# Patient Record
Sex: Male | Born: 1970
Health system: Southern US, Community
[De-identification: ages and names within clinical notes are randomized; demographics above are authoritative.]

## PROBLEM LIST (undated history)

## (undated) DIAGNOSIS — R002 Palpitations: Secondary | ICD-10-CM

## (undated) DIAGNOSIS — K219 Gastro-esophageal reflux disease without esophagitis: Secondary | ICD-10-CM

## (undated) DIAGNOSIS — S42009A Fracture of unspecified part of unspecified clavicle, initial encounter for closed fracture: Secondary | ICD-10-CM

## (undated) DIAGNOSIS — I5020 Unspecified systolic (congestive) heart failure: Secondary | ICD-10-CM

## (undated) DIAGNOSIS — E669 Obesity, unspecified: Secondary | ICD-10-CM

## (undated) DIAGNOSIS — F419 Anxiety disorder, unspecified: Secondary | ICD-10-CM

## (undated) DIAGNOSIS — I2699 Other pulmonary embolism without acute cor pulmonale: Secondary | ICD-10-CM

## (undated) DIAGNOSIS — Z8669 Personal history of other diseases of the nervous system and sense organs: Secondary | ICD-10-CM

## (undated) DIAGNOSIS — I428 Other cardiomyopathies: Secondary | ICD-10-CM

## (undated) DIAGNOSIS — S4290XA Fracture of unspecified shoulder girdle, part unspecified, initial encounter for closed fracture: Secondary | ICD-10-CM

## (undated) DIAGNOSIS — N529 Male erectile dysfunction, unspecified: Secondary | ICD-10-CM

## (undated) DIAGNOSIS — I493 Ventricular premature depolarization: Secondary | ICD-10-CM

## (undated) DIAGNOSIS — K922 Gastrointestinal hemorrhage, unspecified: Secondary | ICD-10-CM

## (undated) DIAGNOSIS — I10 Essential (primary) hypertension: Secondary | ICD-10-CM

## (undated) DIAGNOSIS — M109 Gout, unspecified: Secondary | ICD-10-CM

## (undated) DIAGNOSIS — E785 Hyperlipidemia, unspecified: Secondary | ICD-10-CM

## (undated) DIAGNOSIS — I82401 Acute embolism and thrombosis of unspecified deep veins of right lower extremity: Secondary | ICD-10-CM

## (undated) HISTORY — PX: HIP SURGERY: SHX245

## (undated) HISTORY — DX: Gastro-esophageal reflux disease without esophagitis: K21.9

## (undated) HISTORY — DX: Gout, unspecified: M10.9

## (undated) HISTORY — PX: CARPAL TUNNEL RELEASE: SHX101

## (undated) HISTORY — DX: Male erectile dysfunction, unspecified: N52.9

## (undated) HISTORY — DX: Essential (primary) hypertension: I10

## (undated) HISTORY — DX: Fracture of unspecified shoulder girdle, part unspecified, initial encounter for closed fracture: S42.90XA

## (undated) HISTORY — DX: Other pulmonary embolism without acute cor pulmonale: I26.99

## (undated) HISTORY — DX: Fracture of unspecified part of unspecified clavicle, initial encounter for closed fracture: S42.009A

## (undated) HISTORY — DX: Palpitations: R00.2

## (undated) HISTORY — DX: Anxiety disorder, unspecified: F41.9

## (undated) HISTORY — DX: Other cardiomyopathies: I42.8

## (undated) HISTORY — DX: Acute embolism and thrombosis of unspecified deep veins of right lower extremity: I82.401

## (undated) HISTORY — PX: CARDIAC CATHETERIZATION: SHX172

## (undated) HISTORY — DX: Obesity, unspecified: E66.9

## (undated) HISTORY — DX: Hyperlipidemia, unspecified: E78.5

## (undated) HISTORY — DX: Unspecified systolic (congestive) heart failure: I50.20

## (undated) HISTORY — DX: Ventricular premature depolarization: I49.3

## (undated) HISTORY — DX: Gastrointestinal hemorrhage, unspecified: K92.2

## (undated) HISTORY — DX: Personal history of other diseases of the nervous system and sense organs: Z86.69

---

## 1999-08-30 ENCOUNTER — Emergency Department (HOSPITAL_COMMUNITY): Admission: EM | Admit: 1999-08-30 | Discharge: 1999-08-30 | Payer: Self-pay | Admitting: Emergency Medicine

## 2013-01-04 ENCOUNTER — Emergency Department: Payer: Self-pay | Admitting: Emergency Medicine

## 2013-01-04 LAB — COMPREHENSIVE METABOLIC PANEL
Albumin: 3.8 g/dL (ref 3.4–5.0)
Anion Gap: 6 — ABNORMAL LOW (ref 7–16)
Bilirubin,Total: 0.5 mg/dL (ref 0.2–1.0)
Calcium, Total: 9.2 mg/dL (ref 8.5–10.1)
Chloride: 107 mmol/L (ref 98–107)
Co2: 25 mmol/L (ref 21–32)
EGFR (African American): >60
Potassium: 3.8 mmol/L (ref 3.5–5.1)
SGOT(AST): 57 U/L — ABNORMAL HIGH (ref 15–37)
SGPT (ALT): 76 U/L (ref 12–78)
Total Protein: 8.2 g/dL (ref 6.4–8.2)

## 2013-01-04 LAB — URINALYSIS, COMPLETE
Blood: NEGATIVE
Ketone: NEGATIVE
Leukocyte Esterase: NEGATIVE
Nitrite: NEGATIVE
Ph: 5 (ref 4.5–8.0)
Protein: 100
RBC,UR: 10 /HPF (ref 0–5)

## 2013-01-04 LAB — CBC
HGB: 14.8 g/dL (ref 13.0–18.0)
MCHC: 34.2 g/dL (ref 32.0–36.0)
MCV: 91 fL (ref 80–100)
RBC: 4.77 10*6/uL (ref 4.40–5.90)
WBC: 8 10*3/uL (ref 3.8–10.6)

## 2013-01-04 LAB — LIPASE, BLOOD: Lipase: 300 U/L (ref 73–393)

## 2013-01-06 LAB — STOOL CULTURE

## 2013-09-13 HISTORY — PX: HERNIA REPAIR: SHX51

## 2014-05-27 ENCOUNTER — Emergency Department: Payer: Self-pay | Admitting: Emergency Medicine

## 2014-05-27 LAB — URIC ACID: Uric Acid: 5.4 mg/dL (ref 3.5–7.2)

## 2014-05-27 LAB — CBC WITH DIFFERENTIAL/PLATELET
BASOS ABS: 0.1 10*3/uL (ref 0.0–0.1)
Basophil %: 1.4 %
EOS PCT: 1.3 %
Eosinophil #: 0.1 10*3/uL (ref 0.0–0.7)
HCT: 40.9 % (ref 40.0–52.0)
HGB: 13.7 g/dL (ref 13.0–18.0)
Lymphocyte #: 2.1 10*3/uL (ref 1.0–3.6)
Lymphocyte %: 25 %
MCH: 30.4 pg (ref 26.0–34.0)
MCHC: 33.6 g/dL (ref 32.0–36.0)
MCV: 91 fL (ref 80–100)
MONO ABS: 0.5 x10 3/mm (ref 0.2–1.0)
MONOS PCT: 6.2 %
NEUTROS PCT: 66.1 %
Neutrophil #: 5.5 10*3/uL (ref 1.4–6.5)
Platelet: 237 10*3/uL (ref 150–440)
RBC: 4.51 10*6/uL (ref 4.40–5.90)
RDW: 13.3 % (ref 11.5–14.5)
WBC: 8.3 10*3/uL (ref 3.8–10.6)

## 2014-05-27 LAB — BASIC METABOLIC PANEL
ANION GAP: 4 — AB (ref 7–16)
BUN: 13 mg/dL (ref 7–18)
CALCIUM: 9.1 mg/dL (ref 8.5–10.1)
Chloride: 106 mmol/L (ref 98–107)
Co2: 28 mmol/L (ref 21–32)
Creatinine: 0.92 mg/dL (ref 0.60–1.30)
EGFR (African American): >60
Glucose: 96 mg/dL (ref 65–99)
OSMOLALITY: 276 (ref 275–301)
POTASSIUM: 3.9 mmol/L (ref 3.5–5.1)
SODIUM: 138 mmol/L (ref 136–145)

## 2014-05-27 LAB — SEDIMENTATION RATE: ERYTHROCYTE SED RATE: 8 mm/h (ref 0–15)

## 2014-06-06 ENCOUNTER — Ambulatory Visit: Payer: Self-pay | Admitting: Family Medicine

## 2014-07-31 ENCOUNTER — Ambulatory Visit: Payer: Self-pay | Admitting: Surgery

## 2014-08-07 ENCOUNTER — Ambulatory Visit: Payer: Self-pay | Admitting: Surgery

## 2015-01-04 NOTE — Op Note (Signed)
PATIENT NAME:  David Carlson, David Carlson MR#:  [id number] DATE OF BIRTH:  04/25/1971  DATE OF PROCEDURE:  08/07/2014  PREOPERATIVE DIAGNOSIS: Incarcerated left inguinal hernia, possible bilateral hernias.   POSTOPERATIVE DIAGNOSIS: Incarcerated left indirect inguinal hernia. No right-sided hernia.   PROCEDURE PERFORMED:  Robotic-assisted laparoscopic repair of left inguinal hernia.   ANESTHESIA: General.   ESTIMATED BLOOD LOSS: 25 mL.   COMPLICATIONS: None.   SPECIMENS: None.   INDICATION FOR SURGERY: David Carlson is a pleasant 44 year old who has a symptomatic left groin bulge and CT scan concerning for bilateral hernias. He was thus brought to the operating room suite for a possible repair of bilateral inguinal hernias laparoscopically using  da Vinci robot system.   DETAILS OF PROCEDURE AS FOLLOWS:  Informed consent was obtained.  David Carlson was brought to the operating room suite.  He was induced.  Endotracheal tube was placed. General anesthesia was administered. His abdomen was prepped and draped in standard surgical fashion. A timeout was then performed to correctly identify patient name, operative site, and procedure to be performed. The hernia initially was attempted to be reduced and it was unable to be reduced. I, therefore, decided to proceed with surgery.   A supraumbilical incision was made. It was deepened down to the fascia. The fascia was incised. Peritoneum was entered. Two stay sutures were placed in the fasciotomy. Hasson trocar was placed in the abdomen. Under direct visualization two 8 mm robotic trocar ports were placed 5 mm lateral, approximately 8 cm lateral to the Hasson trocar. Under direct visualization they were inserted to the depth of the sweet spot. A 10 mm trocar was also placed in the left upper quadrant, just slightly cephalad and in between the left lateral and periumbilical trocars. The robot was then docked. The hernia was examined. Both sides were examined. There  did not appear to be a right inguinal hernia.  The left side did have incarcerated omentum which was carefully reduced. After this, a peritoneal flap was placed that was made superiorly to the inguinal hernia. The peritoneal flap was made. The hernia was reduced. All 3 defects were visualized. There was a large indirect hernia, but no direct hernia. A large 3DMax Bard mesh was then placed.  It was tacked to the Cooper's ligament and superiorly to the abdominal wall. Next, the peritoneal flap was placed over the mesh and sutured with a Bard barbed Prolene mesh. The peritoneum was closed with visible mesh. I then made a small flap over the right side of the direct space to ensure that there was no direct hernia. This was closed using a Bard suture as well.   Following this, the abdomen was desufflated and all trocars were removed. The supraumbilical fascia was closed with a figure-of-eight 0 Vicryl. The skin was then closed at all trocar sites with interrupted 4-0 Monocryl deep dermal sutures. Steri-Strips, Telfa gauze, and Tegaderm were then placed over the wounds. The patient was then awoken, extubated, and brought to the postanesthesia care unit. There were no immediate complications. Needle, sponge, and instrument counts were correct at the end of the procedure.    ____________________________ Glena Norfolk. Emmalin Jaquess, MD cal:LT D: 08/07/2014 13:41:00 ET T: 08/07/2014 16:02:47 ET JOB#: [id number]  cc: Harrell Gave A. Icesis Renn, MD, <Dictator> Floyde Parkins MD ELECTRONICALLY SIGNED 08/19/2014 20:07

## 2015-02-14 ENCOUNTER — Telehealth: Payer: Self-pay | Admitting: Family Medicine

## 2015-02-14 DIAGNOSIS — I10 Essential (primary) hypertension: Secondary | ICD-10-CM

## 2015-02-14 MED ORDER — LOSARTAN POTASSIUM 100 MG PO TABS: 50.0000 mg | ORAL_TABLET | Freq: Every day | ORAL | Status: DC

## 2015-02-14 NOTE — Telephone Encounter (Signed)
Refilled medication for 1 month.  appt on 03/14/15

## 2015-03-14 ENCOUNTER — Encounter: Payer: Self-pay | Admitting: Family Medicine

## 2015-03-14 ENCOUNTER — Ambulatory Visit (INDEPENDENT_AMBULATORY_CARE_PROVIDER_SITE_OTHER): Payer: Managed Care, Other (non HMO) | Admitting: Family Medicine

## 2015-03-14 VITALS — BP 120/80 | HR 94 | Temp 98.6°F | Ht 71.0 in | Wt 204.8 lb

## 2015-03-14 DIAGNOSIS — E781 Pure hyperglyceridemia: Secondary | ICD-10-CM | POA: Diagnosis not present

## 2015-03-14 DIAGNOSIS — K219 Gastro-esophageal reflux disease without esophagitis: Secondary | ICD-10-CM | POA: Insufficient documentation

## 2015-03-14 DIAGNOSIS — I10 Essential (primary) hypertension: Secondary | ICD-10-CM | POA: Diagnosis not present

## 2015-03-14 DIAGNOSIS — A09 Infectious gastroenteritis and colitis, unspecified: Secondary | ICD-10-CM | POA: Insufficient documentation

## 2015-03-14 DIAGNOSIS — M542 Cervicalgia: Secondary | ICD-10-CM | POA: Insufficient documentation

## 2015-03-14 DIAGNOSIS — F411 Generalized anxiety disorder: Secondary | ICD-10-CM | POA: Diagnosis not present

## 2015-03-14 DIAGNOSIS — G44209 Tension-type headache, unspecified, not intractable: Secondary | ICD-10-CM | POA: Insufficient documentation

## 2015-03-14 DIAGNOSIS — L309 Dermatitis, unspecified: Secondary | ICD-10-CM | POA: Insufficient documentation

## 2015-03-14 DIAGNOSIS — E782 Mixed hyperlipidemia: Secondary | ICD-10-CM | POA: Insufficient documentation

## 2015-03-14 DIAGNOSIS — F419 Anxiety disorder, unspecified: Secondary | ICD-10-CM | POA: Insufficient documentation

## 2015-03-14 MED ORDER — GEMFIBROZIL 600 MG PO TABS: 600.0000 mg | ORAL_TABLET | Freq: Two times a day (BID) | ORAL | Status: DC

## 2015-03-14 MED ORDER — LOSARTAN POTASSIUM 50 MG PO TABS: 50.0000 mg | ORAL_TABLET | Freq: Every day | ORAL | Status: DC

## 2015-03-14 MED ORDER — CLONAZEPAM 0.5 MG PO TABS: 0.5000 mg | ORAL_TABLET | Freq: Three times a day (TID) | ORAL | Status: DC | PRN

## 2015-03-14 NOTE — Progress Notes (Signed)
Name: David Carlson   MRN: [id number]    DOB: 04-06-1971   Date:03/14/2015       Progress Note  Subjective  Chief Complaint  Chief Complaint  Patient presents with  . Follow-up    trigs. and B/P    Hyperlipidemia The current episode started more than 1 month ago (first diagnosed in November 2015). The problem is uncontrolled. Recent lipid tests were reviewed and are high (TG level of [phone]). Pertinent negatives include no leg pain, myalgias or shortness of breath. Current antihyperlipidemic treatment includes fibric acid derivatives (Lopid 600mg  BID). The current treatment provides moderate improvement of lipids. There are no compliance problems.   Hypertension The problem is controlled. Associated symptoms include anxiety and headaches. Pertinent negatives include no palpitations, peripheral edema or shortness of breath. Past treatments include angiotensin blockers. The current treatment provides significant improvement. There is no history of angina, kidney disease, CAD/MI or CVA.  Anxiety Presents for follow-up visit. Symptoms include irritability and muscle tension (Specifically tension in his neck muscles). Patient reports no insomnia, palpitations, panic or shortness of breath. The symptoms are aggravated by work stress.   Past treatments include benzodiazephines. The treatment provided significant relief. Compliance with prior treatments has been good.      Past Medical History  Diagnosis Date  . Hypertension   . Hyperlipidemia     Past Surgical History  Procedure Laterality Date  . Hernia repair      Family History  Problem Relation Age of Onset  . Diabetes Mother   . Hypertension Father   . Heart disease Maternal Grandmother   . Diabetes Maternal Grandfather     History   Social History  . Marital Status: Single    Spouse Name: N/A  . Number of Children: N/A  . Years of Education: N/A   Occupational History  . Not on file.   Social History  Main Topics  . Smoking status: Current Every Day Smoker  . Smokeless tobacco: Never Used  . Alcohol Use: 0.0 oz/week    0 Standard drinks or equivalent per week  . Drug Use: No  . Sexual Activity: Not on file   Other Topics Concern  . Not on file   Social History Narrative  . No narrative on file     Current outpatient prescriptions:  .  clonazePAM (KLONOPIN) 0.5 MG tablet, Take by mouth., Disp: , Rfl:  .  gemfibrozil (LOPID) 600 MG tablet, Take by mouth., Disp: , Rfl:  .  losartan (COZAAR) 100 MG tablet, Take 0.5 tablets (50 mg total) by mouth daily., Disp: 30 tablet, Rfl: 0  Not on File   Review of Systems  Constitutional: Positive for irritability.  Respiratory: Negative for shortness of breath.   Cardiovascular: Negative for palpitations.  Musculoskeletal: Negative for myalgias.  Neurological: Positive for headaches.  Psychiatric/Behavioral: The patient does not have insomnia.       Objective  Filed Vitals:   03/14/15 0904  BP: 120/80  Pulse: 94  Temp: 98.6 F (37 C)  TempSrc: Oral  Height: 5\' 11"  (1.803 m)  Weight: 204 lb 12.8 oz (92.897 kg)  SpO2: 97%    Physical Exam  Constitutional: He is oriented to person, place, and time and well-developed, well-nourished, and in no distress.  HENT:  Head: Normocephalic and atraumatic.  Cardiovascular: Normal rate and regular rhythm.   Pulmonary/Chest: Effort normal and breath sounds normal.  Musculoskeletal: He exhibits no edema.  Neurological: He is alert and oriented to person,  place, and time.  Psychiatric: Affect and judgment normal.  Nursing note and vitals reviewed.      No results found for this or any previous visit (from the past 2160 hour(s)).   Assessment & Plan  1. Essential hypertension Blood pressure stable on present therapy. - losartan (COZAAR) 50 MG tablet; Take 1 tablet (50 mg total) by mouth daily.  Dispense: 90 tablet; Refill: 0  2. Generalized anxiety disorder Symptoms of  anxiety and muscle tension are responsive to present benzodiazepine therapy. Patient is taking the medication as needed and as directed. - clonazePAM (KLONOPIN) 0.5 MG tablet; Take 1 tablet (0.5 mg total) by mouth 3 (three) times daily as needed for anxiety.  Dispense: 90 tablet; Refill: 0  3. Hypertriglyceridemia Repeat lipid panel for follow-up of triglyceride levels today - gemfibrozil (LOPID) 600 MG tablet; Take 1 tablet (600 mg total) by mouth 2 (two) times daily before a meal.  Dispense: 180 tablet; Refill: 0 - Lipid panel - Comprehensive metabolic panel   Chrisanne Loose [facility] 03/14/2015 9:24 AM

## 2015-03-15 LAB — COMPREHENSIVE METABOLIC PANEL
ALT: 26 IU/L (ref 0–44)
AST: 23 IU/L (ref 0–40)
Albumin/Globulin Ratio: 1.9 (ref 1.1–2.5)
Albumin: 4.4 g/dL (ref 3.5–5.5)
Alkaline Phosphatase: 59 IU/L (ref 39–117)
BUN / CREAT RATIO: 16 (ref 9–20)
BUN: 15 mg/dL (ref 6–24)
Bilirubin Total: 0.4 mg/dL (ref 0.0–1.2)
CALCIUM: 9.4 mg/dL (ref 8.7–10.2)
CHLORIDE: 103 mmol/L (ref 97–108)
CO2: 22 mmol/L (ref 18–29)
Creatinine, Ser: 0.94 mg/dL (ref 0.76–1.27)
GFR calc Af Amer: 114 mL/min/{1.73_m2} (ref >59)
GFR, EST NON AFRICAN AMERICAN: 99 mL/min/{1.73_m2} (ref >59)
GLUCOSE: 96 mg/dL (ref 65–99)
Globulin, Total: 2.3 g/dL (ref 1.5–4.5)
Potassium: 4.6 mmol/L (ref 3.5–5.2)
SODIUM: 144 mmol/L (ref 134–144)
Total Protein: 6.7 g/dL (ref 6.0–8.5)

## 2015-03-15 LAB — LIPID PANEL
CHOLESTEROL TOTAL: 178 mg/dL (ref 100–199)
Chol/HDL Ratio: 5.7 ratio units — ABNORMAL HIGH (ref 0.0–5.0)
HDL: 31 mg/dL — AB (ref >39)
LDL CALC: 113 mg/dL — AB (ref 0–99)
Triglycerides: 168 mg/dL — ABNORMAL HIGH (ref 0–149)
VLDL Cholesterol Cal: 34 mg/dL (ref 5–40)

## 2015-06-17 ENCOUNTER — Ambulatory Visit (INDEPENDENT_AMBULATORY_CARE_PROVIDER_SITE_OTHER): Payer: Managed Care, Other (non HMO) | Admitting: Family Medicine

## 2015-06-17 ENCOUNTER — Encounter: Payer: Self-pay | Admitting: Family Medicine

## 2015-06-17 VITALS — BP 118/77 | HR 82 | Temp 98.0°F | Resp 18 | Ht 71.0 in | Wt 196.5 lb

## 2015-06-17 DIAGNOSIS — F411 Generalized anxiety disorder: Secondary | ICD-10-CM | POA: Diagnosis not present

## 2015-06-17 DIAGNOSIS — E782 Mixed hyperlipidemia: Secondary | ICD-10-CM | POA: Diagnosis not present

## 2015-06-17 DIAGNOSIS — I10 Essential (primary) hypertension: Secondary | ICD-10-CM | POA: Diagnosis not present

## 2015-06-17 MED ORDER — GEMFIBROZIL 600 MG PO TABS: 600.0000 mg | ORAL_TABLET | Freq: Two times a day (BID) | ORAL | Status: DC

## 2015-06-17 MED ORDER — LOSARTAN POTASSIUM 50 MG PO TABS: 50.0000 mg | ORAL_TABLET | Freq: Every day | ORAL | Status: DC

## 2015-06-17 MED ORDER — CLONAZEPAM 0.5 MG PO TABS: 0.5000 mg | ORAL_TABLET | Freq: Three times a day (TID) | ORAL | Status: DC | PRN

## 2015-06-17 NOTE — Progress Notes (Signed)
Name: David Carlson   MRN: [id number]    DOB: 01-29-1971   Date:06/17/2015       Progress Note  Subjective  Chief Complaint  Chief Complaint  Patient presents with  . Follow-up    3 mo  . Anxiety  . Hypertension  . Medication Refill    losartan 50mg  / clnazepam 0.5mg     Anxiety Presents for follow-up visit. Symptoms include excessive worry, irritability and nervous/anxious behavior. Patient reports no chest pain, palpitations, panic or shortness of breath. The symptoms are aggravated by work stress.   Past treatments include benzodiazephines. The treatment provided significant relief. Compliance with prior treatments has been good.  Hypertension This is a chronic problem. The problem is controlled. Associated symptoms include anxiety. Pertinent negatives include no blurred vision, chest pain, headaches, palpitations or shortness of breath. Past treatments include angiotensin blockers. The current treatment provides significant improvement. There is no history of CAD/MI or CVA.  Hyperlipidemia This is a chronic problem. Recent lipid tests were reviewed and are high. Pertinent negatives include no chest pain, leg pain, myalgias or shortness of breath. Current antihyperlipidemic treatment includes fibric acid derivatives. The current treatment provides moderate improvement of lipids.   Past Medical History  Diagnosis Date  . Hypertension   . Hyperlipidemia     Past Surgical History  Procedure Laterality Date  . Hernia repair     Family History  Problem Relation Age of Onset  . Diabetes Mother   . Hypertension Father   . Heart disease Maternal Grandmother   . Diabetes Maternal Grandfather    Social History   Social History  . Marital Status: Single    Spouse Name: N/A  . Number of Children: N/A  . Years of Education: N/A   Occupational History  . Not on file.   Social History Main Topics  . Smoking status: Current Every Day Smoker  . Smokeless tobacco: Never  Used  . Alcohol Use: 0.0 oz/week    0 Standard drinks or equivalent per week  . Drug Use: No  . Sexual Activity: Not on file   Other Topics Concern  . Not on file   Social History Narrative    Current outpatient prescriptions:  .  clonazePAM (KLONOPIN) 0.5 MG tablet, Take 1 tablet (0.5 mg total) by mouth 3 (three) times daily as needed for anxiety., Disp: 90 tablet, Rfl: 0 .  gemfibrozil (LOPID) 600 MG tablet, Take 1 tablet (600 mg total) by mouth 2 (two) times daily before a meal., Disp: 180 tablet, Rfl: 0 .  losartan (COZAAR) 50 MG tablet, Take 1 tablet (50 mg total) by mouth daily., Disp: 90 tablet, Rfl: 0  No Known Allergies   Review of Systems  Constitutional: Positive for irritability.  Eyes: Negative for blurred vision.  Respiratory: Negative for shortness of breath.   Cardiovascular: Negative for chest pain and palpitations.  Musculoskeletal: Negative for myalgias.  Neurological: Negative for headaches.  Psychiatric/Behavioral: Negative for depression. The patient is nervous/anxious.    Objective  Filed Vitals:   06/17/15 0817  BP: 118/77  Pulse: 82  Temp: 98 F (36.7 C)  TempSrc: Oral  Resp: 18  Height: 5\' 11"  (1.803 m)  Weight: 196 lb 8 oz (89.132 kg)  SpO2: 97%   Physical Exam  Constitutional: He is oriented to person, place, and time and well-developed, well-nourished, and in no distress.  HENT:  Head: Normocephalic and atraumatic.  Cardiovascular: Normal rate and regular rhythm.   Pulmonary/Chest: Effort normal and  breath sounds normal.  Abdominal: Soft. Bowel sounds are normal.  Neurological: He is alert and oriented to person, place, and time.  Psychiatric: Memory, affect and judgment normal.  Nursing note and vitals reviewed.  Assessment & Plan  1. Essential hypertension Blood pressure at goal. Continue present therapy. - losartan (COZAAR) 50 MG tablet; Take 1 tablet (50 mg total) by mouth daily.  Dispense: 30 tablet; Refill: 2  2.  Generalized anxiety disorder  - clonazePAM (KLONOPIN) 0.5 MG tablet; Take 1 tablet (0.5 mg total) by mouth 3 (three) times daily as needed for anxiety.  Dispense: 90 tablet; Refill: 2  3. Hypercholesterolemia with hypertriglyceridemia LDL was elevated in July 2016. Recheck lipid panel today. Continue gemfibrozil for now. - gemfibrozil (LOPID) 600 MG tablet; Take 1 tablet (600 mg total) by mouth 2 (two) times daily before a meal.  Dispense: 60 tablet; Refill: 2 - Lipid Profile   Jance Siek [facility] 06/17/2015 8:29 AM

## 2015-06-18 LAB — LIPID PANEL
CHOLESTEROL TOTAL: 188 mg/dL (ref 100–199)
Chol/HDL Ratio: 6.1 ratio units — ABNORMAL HIGH (ref 0.0–5.0)
HDL: 31 mg/dL — ABNORMAL LOW (ref >39)
LDL CALC: 119 mg/dL — AB (ref 0–99)
TRIGLYCERIDES: 192 mg/dL — AB (ref 0–149)
VLDL Cholesterol Cal: 38 mg/dL (ref 5–40)

## 2015-07-10 ENCOUNTER — Ambulatory Visit (INDEPENDENT_AMBULATORY_CARE_PROVIDER_SITE_OTHER): Payer: Managed Care, Other (non HMO) | Admitting: Family Medicine

## 2015-07-10 ENCOUNTER — Encounter: Payer: Self-pay | Admitting: Family Medicine

## 2015-07-10 VITALS — BP 118/73 | HR 98 | Temp 97.9°F | Resp 19 | Ht 71.0 in | Wt 195.7 lb

## 2015-07-10 DIAGNOSIS — R1030 Lower abdominal pain, unspecified: Secondary | ICD-10-CM

## 2015-07-10 DIAGNOSIS — E782 Mixed hyperlipidemia: Secondary | ICD-10-CM

## 2015-07-10 DIAGNOSIS — R1032 Left lower quadrant pain: Secondary | ICD-10-CM

## 2015-07-10 MED ORDER — ATORVASTATIN CALCIUM 10 MG PO TABS: 10.0000 mg | ORAL_TABLET | Freq: Every day | ORAL | Status: DC

## 2015-07-10 NOTE — Progress Notes (Signed)
Name: David Carlson   MRN: [id number]    DOB: 1971-03-17   Date:07/10/2015       Progress Note  Subjective  Chief Complaint  Chief Complaint  Patient presents with  . Follow-up    Discuss statin therapy  . Hyperlipidemia    Hyperlipidemia This is a chronic problem. Recent lipid tests were reviewed and are high. Current antihyperlipidemic treatment includes fibric acid derivatives.   Persistently elevated triglycerides and LDL while HDL is below the normal range. 10 year risk of CVD is estimated at 8%.  David Carlson also complains of recurrent left groin pain. He has history of left inguinal hernia which was repaired some years ago. Now seeing any bulge at the site. No nausea, vomiting, or other concerning symptoms. Feels that sometimes the pain from the front radiates to his lower back  Past Medical History  Diagnosis Date  . Hypertension   . Hyperlipidemia     Past Surgical History  Procedure Laterality Date  . Hernia repair      Family History  Problem Relation Age of Onset  . Diabetes Mother   . Hypertension Father   . Heart disease Maternal Grandmother   . Diabetes Maternal Grandfather     Social History   Social History  . Marital Status: Single    Spouse Name: N/A  . Number of Children: N/A  . Years of Education: N/A   Occupational History  . Not on file.   Social History Main Topics  . Smoking status: Current Every Day Smoker  . Smokeless tobacco: Never Used  . Alcohol Use: 0.0 oz/week    0 Standard drinks or equivalent per week  . Drug Use: No  . Sexual Activity: Not on file   Other Topics Concern  . Not on file   Social History Narrative     Current outpatient prescriptions:  .  clonazePAM (KLONOPIN) 0.5 MG tablet, Take 1 tablet (0.5 mg total) by mouth 3 (three) times daily as needed for anxiety., Disp: 90 tablet, Rfl: 2 .  gemfibrozil (LOPID) 600 MG tablet, Take 1 tablet (600 mg total) by mouth 2 (two) times daily before a meal., Disp: 60  tablet, Rfl: 2 .  losartan (COZAAR) 50 MG tablet, Take 1 tablet (50 mg total) by mouth daily., Disp: 30 tablet, Rfl: 2  No Known Allergies   Review of Systems  Constitutional: Negative for fever and chills.  Gastrointestinal: Positive for abdominal pain. Negative for nausea and vomiting.      Objective  Filed Vitals:   07/10/15 1616  BP: 118/73  Pulse: 98  Temp: 97.9 F (36.6 C)  TempSrc: Oral  Resp: 19  Height: 5\' 11"  (1.803 m)  Weight: 195 lb 11.2 oz (88.769 kg)  SpO2: 94%    Physical Exam  Constitutional: He is well-developed, well-nourished, and in no distress.  Cardiovascular: Normal rate, regular rhythm and normal heart sounds.   Pulmonary/Chest: Effort normal and breath sounds normal. He has no wheezes. He has no rales.  Abdominal: Soft. Bowel sounds are normal. There is tenderness in the left lower quadrant.    Mild tenderness to palpation over the left groin/inguinal area. Pt. Feels that pain travels from the front to his lower back. History of prior hernia repair.  Nursing note and vitals reviewed.   Recent Results (from the past 2160 hour(s))  Lipid Profile     Status: Abnormal   Collection Time: 06/17/15  9:18 AM  Result Value Ref Range   Cholesterol,  Total 188 100 - 199 mg/dL   Triglycerides 192 (H) 0 - 149 mg/dL   HDL 31 (L) >39 mg/dL    Comment: According to ATP-III Guidelines, HDL-C >59 mg/dL is considered a negative risk factor for CHD.    VLDL Cholesterol Cal 38 5 - 40 mg/dL   LDL Calculated 119 (H) 0 - 99 mg/dL   Chol/HDL Ratio 6.1 (H) 0.0 - 5.0 ratio units    Comment:                                   T. Chol/HDL Ratio                                             Men  Women                               1/2 Avg.Risk  3.4    3.3                                   Avg.Risk  5.0    4.4                                2X Avg.Risk  9.6    7.1                                3X Avg.Risk 23.4   11.0      Assessment & Plan  1.  Hypercholesterolemia with hypertriglyceridemia DC gemfibrozil and started patient on a atorvastatin 10 mg at bedtime. Recheck lipid panel in 3-4 months. - atorvastatin (LIPITOR) 10 MG tablet; Take 1 tablet (10 mg total) by mouth daily.  Dispense: 90 tablet; Refill: 0  2. Left inguinal pain Persistent discomfort at the site of left inguinal hernia repair. We'll obtain MRI of pelvis for evaluation. - MR Pelvis W Wo Contrast; Future   Zameer Borman [facility] 07/10/2015 4:36 PM

## 2015-07-31 ENCOUNTER — Telehealth: Payer: Self-pay | Admitting: Family Medicine

## 2015-07-31 NOTE — Telephone Encounter (Signed)
Patient is checking status on his MRI referral appointment. Please return call [phone]

## 2015-08-01 ENCOUNTER — Encounter: Payer: Self-pay | Admitting: *Deleted

## 2015-08-01 ENCOUNTER — Emergency Department
Admission: EM | Admit: 2015-08-01 | Discharge: 2015-08-01 | Disposition: A | Discharge status: Home / Self Care | Payer: Managed Care, Other (non HMO) | Attending: Emergency Medicine | Admitting: Emergency Medicine

## 2015-08-01 DIAGNOSIS — F17200 Nicotine dependence, unspecified, uncomplicated: Secondary | ICD-10-CM | POA: Insufficient documentation

## 2015-08-01 DIAGNOSIS — R1030 Lower abdominal pain, unspecified: Secondary | ICD-10-CM | POA: Diagnosis not present

## 2015-08-01 DIAGNOSIS — I10 Essential (primary) hypertension: Secondary | ICD-10-CM | POA: Insufficient documentation

## 2015-08-01 DIAGNOSIS — Z79899 Other long term (current) drug therapy: Secondary | ICD-10-CM | POA: Diagnosis not present

## 2015-08-01 DIAGNOSIS — R1032 Left lower quadrant pain: Secondary | ICD-10-CM

## 2015-08-01 MED ORDER — HYDROCODONE-ACETAMINOPHEN 5-325 MG PO TABS: 1.0000 | ORAL_TABLET | Freq: Four times a day (QID) | ORAL | Status: DC | PRN

## 2015-08-01 MED ORDER — HYDROCODONE-ACETAMINOPHEN 5-325 MG PO TABS
2.0000 | ORAL_TABLET | Freq: Once | ORAL | Status: AC
  Administered 2015-08-01: 2 via ORAL
  Filled 2015-08-01: qty 2

## 2015-08-01 NOTE — ED Notes (Signed)
Pt reports left sided groin pain for the last several months, pt reports an increase in pain, pt denies dysuria or any other symptoms

## 2015-08-01 NOTE — ED Provider Notes (Signed)
[facility] Emergency Department Provider Note REMINDER - THIS NOTE IS NOT A FINAL MEDICAL RECORD UNTIL IT IS SIGNED. UNTIL THEN, THE CONTENT BELOW MAY REFLECT INFORMATION FROM A DOCUMENTATION TEMPLATE, NOT THE ACTUAL PATIENT VISIT. ____________________________________________  Time seen: Approximately 2:12 PM  I have reviewed the triage vital signs and the nursing notes.   HISTORY  Chief Complaint Groin Pain    HPI David Carlson is a 44 y.o. male the previous history of hypertension, hyperlipidemia, and a left inguinal hernia repair in November last year.  Patient reports for about the last month he said increasing discomfort at the site of his old hernia repair, it comes and goes at times, and is never severe but has become aching and that nagging pain. He was supposed to have an MRI done, but evidently it cannot be done for another 2 weeks so he came to the ER for evaluation today. Denies nausea vomiting, denies pain in the abdomen. No fevers or chills. No pain in the scrotum or testicles.   He denies severe pain, and reports that his hernia was much much worse when he was incarcerated year ago.  No redness or swelling.   Past Medical History  Diagnosis Date  . Hypertension   . Hyperlipidemia     Patient Active Problem List   Diagnosis Date Noted  . Left inguinal pain 07/10/2015  . Acid reflux 03/14/2015  . Anxiety disorder 03/14/2015  . Hypercholesterolemia with hypertriglyceridemia 03/14/2015  . BP (high blood pressure) 03/14/2015  . Dermatitis, eczematoid 03/14/2015  . Headache, tension-type 03/14/2015  . Cervical pain 03/14/2015    Past Surgical History  Procedure Laterality Date  . Hernia repair      Current Outpatient Rx  Name  Route  Sig  Dispense  Refill  . atorvastatin (LIPITOR) 10 MG tablet   Oral   Take 1 tablet (10 mg total) by mouth daily.   90 tablet   0   . clonazePAM (KLONOPIN) 0.5 MG tablet   Oral   Take 1  tablet (0.5 mg total) by mouth 3 (three) times daily as needed for anxiety.   90 tablet   2   . HYDROcodone-acetaminophen (NORCO/VICODIN) 5-325 MG tablet   Oral   Take 1 tablet by mouth every 6 (six) hours as needed for moderate pain.   20 tablet   0   . losartan (COZAAR) 50 MG tablet   Oral   Take 1 tablet (50 mg total) by mouth daily.   30 tablet   2     Allergies Review of patient's allergies indicates no known allergies.  Family History  Problem Relation Age of Onset  . Diabetes Mother   . Hypertension Father   . Heart disease Maternal Grandmother   . Diabetes Maternal Grandfather     Social History Social History  Substance Use Topics  . Smoking status: Current Every Day Smoker  . Smokeless tobacco: Never Used  . Alcohol Use: 0.0 oz/week    0 Standard drinks or equivalent per week    Review of Systems Constitutional: No fever/chills Eyes: No visual changes. ENT: No sore throat. Cardiovascular: Denies chest pain. Respiratory: Denies shortness of breath. Gastrointestinal: No abdominal pain.  No nausea, no vomiting.  No diarrhea.  No constipation. See history of present illness regarding left groin discomfort. Genitourinary: Negative for dysuria. Musculoskeletal: Negative for back pain. Skin: Negative for rash. Neurological: Negative for headaches, focal weakness or numbness.  10-point ROS otherwise negative.  ____________________________________________   PHYSICAL EXAM:  VITAL SIGNS: ED Triage Vitals  Enc Vitals Group     BP 08/01/15 1328 149/95 mmHg     Pulse Rate 08/01/15 1328 109     Resp 08/01/15 1328 20     Temp 08/01/15 1328 98 F (36.7 C)     Temp Source 08/01/15 1328 Oral     SpO2 08/01/15 1328 97 %     Weight 08/01/15 1328 194 lb (87.998 kg)     Height 08/01/15 1328 5\' 11"  (1.803 m)     Head Cir --      Peak Flow --      Pain Score 08/01/15 1329 6     Pain Loc --      Pain Edu? --      Excl. in [place]? --    Constitutional: Alert and  oriented. Well appearing and in no acute distress. Eyes: Conjunctivae are normal. PERRL. EOMI. Head: Atraumatic. Nose: No congestion/rhinnorhea. Mouth/Throat: Mucous membranes are moist.  Oropharynx non-erythematous. Neck: No stridor.   Cardiovascular: Normal rate, regular rhythm. Grossly normal heart sounds.  Good peripheral circulation. Respiratory: Normal respiratory effort.  No retractions. Lungs CTAB. Gastrointestinal: Soft and nontender. No distention. No abdominal bruits. No CVA tenderness. Patient has some very mild tenderness over the left groin without erythema, edema, or induration. On testicular exam and inguinal canal exam there is no evidence of hernia, the patient's left inguinal canal is nontender without mass palpated. Musculoskeletal: No lower extremity tenderness nor edema.  No joint effusions. Neurologic:  Normal speech and language. No gross focal neurologic deficits are appreciated. No gait instability. Skin:  Skin is warm, dry and intact. No rash noted. Psychiatric: Mood and affect are normal. Speech and behavior are normal.  ____________________________________________   LABS (all labs ordered are listed, but only abnormal results are displayed)  Labs Reviewed - No data to display ____________________________________________  EKG   ____________________________________________  RADIOLOGY   ____________________________________________   PROCEDURES  Procedure(s) performed: None  Critical Care performed: No  ____________________________________________   INITIAL IMPRESSION / ASSESSMENT AND PLAN / ED COURSE  Pertinent labs & imaging results that were available during my care of the patient were reviewed by me and considered in my medical decision making (see chart for details).  Patient presents with increasing pain at the same as previous groin repair. I believe he likely has some increasing pain at the site of his previous hernia repair, however he  has no evidence of acute incarceration. He does however have slowly worsening discomfort in the area, and his exam is quite reassuring with no abdominal pain and no evidence of incarcerated hernia. I discussed this case and care with Dr. Sherri Rad of [facility] surgical, who advises that no imaging is necessary at this time but the patient should follow up closely with them in the clinic this week. The patient is agreeable with this plan, and we discuss careful return precautions for signs and symptoms of an incarcerated hernia which the patient has had previously, but clearly does not demonstrate signs or symptoms of today. The patient is quite agreeable. He is agreeable not to drive while taking hydrocodone.   I will prescribe the patient a narcotic pain medicine due to their condition which I anticipate will cause at least moderate pain short term. I discussed with the patient safe use of narcotic pain medicines, and that they are not to drive, use within 8 hours of clonazepam, work in dangerous areas, or ever take  more than prescribed (no more than 1 pill every 6 hours). We discussed that this is the type of medication that "Alfonse Spruce" may have overdosed on and the risks of this type of medicine. Patient is very agreeable to only use as prescribed and to never use more than prescribed.  ____________________________________________   FINAL CLINICAL IMPRESSION(S) / ED DIAGNOSES  Final diagnoses:  Groin pain, left      Delman Kitten, MD 08/01/15 1421

## 2015-08-01 NOTE — ED Notes (Signed)
Pt verbalized understanding of discharge instructions. NAD at this time. 

## 2015-08-01 NOTE — Discharge Instructions (Signed)
You were seen in the emergency room for abdominal pain. It is important that you follow up closely with [facility] (Dr. Marina Gravel) in the next 4 to 5 days.  Please return to the emergency room right away if you are to develop a fever, severe nausea, your pain becomes severe or worsens, you are unable to keep food down, begin vomiting any dark or bloody fluid, you develop any dark or bloody stools, feel dehydrated, or other new concerns or symptoms arise.

## 2015-08-01 NOTE — ED Notes (Signed)
Pt states his PCP was going to schedule him for MRI and that it will be 2 or more weeks before it can be scheduled. Pt also states that he had a hernia a year ago on the same side. Pt states pain has increased and could not wait for MRI.

## 2015-08-01 NOTE — Telephone Encounter (Signed)
Pt notified of his MRI appt, date and time.

## 2015-08-04 ENCOUNTER — Ambulatory Visit (INDEPENDENT_AMBULATORY_CARE_PROVIDER_SITE_OTHER): Payer: Managed Care, Other (non HMO) | Admitting: Surgery

## 2015-08-04 ENCOUNTER — Telehealth: Payer: Self-pay

## 2015-08-04 ENCOUNTER — Other Ambulatory Visit: Payer: Self-pay | Admitting: *Deleted

## 2015-08-04 ENCOUNTER — Encounter: Payer: Self-pay | Admitting: Surgery

## 2015-08-04 VITALS — BP 148/92 | HR 83 | Temp 98.3°F | Ht 71.0 in | Wt 199.0 lb

## 2015-08-04 DIAGNOSIS — R1030 Lower abdominal pain, unspecified: Secondary | ICD-10-CM

## 2015-08-04 NOTE — Telephone Encounter (Signed)
Called patient to let him know that his appointment for his CT Scan will be on 08/12/2015 at 9:15 AM at the [place] location. I also told him that he should not drink or eat 4 hours prior to his appointment.  I also told him that his MRI was cancelled. Patient understood.  Patient was given a follow up appointment to come and see Dr. Burt Knack on 08/29/2015 at 1:45 PM at the [facility] office to go over his CT Scan results.

## 2015-08-04 NOTE — Patient Instructions (Signed)
Central scheduling will be contacting you with the date and time of your CT Scan. I will cancel your MRI.

## 2015-08-04 NOTE — Progress Notes (Signed)
Outpatient Surgical Follow Up  08/04/2015  David Carlson is an 44 y.o. male.   CC: Left Groin pain  HPI: This a patient status post robotic transabdominal repair of a left inguinal hernia by Dr. Rexene Edison. At the time no right inguinal hernia was identified. This was done in November 2015 1 year ago. Patient describes 6 months of left groin pain without bulge it is not affecting his ability to work. He has no nausea or vomiting and no fevers or chills.   Past Medical History  Diagnosis Date  . Hypertension   . Hyperlipidemia     Past Surgical History  Procedure Laterality Date  . Hernia repair      Family History  Problem Relation Age of Onset  . Diabetes Mother   . Hypertension Father   . Heart disease Maternal Grandmother   . Diabetes Maternal Grandfather     Social History:  reports that he has been smoking Cigarettes.  He has been smoking about 1.00 pack per day. He has never used smokeless tobacco. He reports that he drinks alcohol. He reports that he does not use illicit drugs.  Allergies: No Known Allergies  Medications reviewed.   Review of Systems:   Review of Systems  Constitutional: Negative.   HENT: Negative.   Eyes: Negative.   Respiratory: Negative.   Cardiovascular: Negative.   Gastrointestinal: Positive for abdominal pain. Negative for diarrhea and constipation.  Genitourinary: Negative.   Musculoskeletal: Negative.   Skin: Negative.   Neurological: Negative.   Endo/Heme/Allergies: Negative.   Psychiatric/Behavioral: Negative.      Physical Exam:  BP 148/92 mmHg  Pulse 83  Temp(Src) 98.3 F (36.8 C) (Oral)  Ht 5\' 11"  (1.803 m)  Wt 199 lb (90.266 kg)  BMI 27.77 kg/m2  Physical Exam  Constitutional: He is oriented to person, place, and time and well-developed, well-nourished, and in no distress. No distress.  HENT:  Head: Normocephalic and atraumatic.  Eyes: Right eye exhibits no discharge. Left eye exhibits no discharge. No  scleral icterus.  Abdominal: Soft. He exhibits no distension. There is no tenderness. There is no rebound and no guarding.  Genitourinary: Penis normal.  Patient examined standing and supine and with Valsalva. No right inguinal hernias noted. No recurrent left inguinal hernia is noted and he is nontender.  Musculoskeletal: Normal range of motion.  Lymphadenopathy:    He has no cervical adenopathy.  Neurological: He is alert and oriented to person, place, and time.  Skin: Skin is warm and dry.  Psychiatric: Mood and affect normal.      No results found for this or any previous visit (from the past 48 hour(s)). No results found.  Assessment/Plan:  Status post robotic left transabdominal inguinal hernia repair. He is 1 year out and is having 6 months of groin pain. We'll recommend obtaining a CT scan of follow-up in 2 weeks.  Florene Glen, MD, FACS

## 2015-08-11 ENCOUNTER — Telehealth (HOSPITAL_COMMUNITY): Payer: Self-pay | Admitting: Surgery

## 2015-08-12 ENCOUNTER — Ambulatory Visit: Admission: RE | Admit: 2015-08-12 | Payer: Managed Care, Other (non HMO) | Source: Ambulatory Visit

## 2015-08-19 ENCOUNTER — Ambulatory Visit
Admission: RE | Admit: 2015-08-19 | Discharge: 2015-08-19 | Disposition: A | Discharge status: Home / Self Care | Payer: Managed Care, Other (non HMO) | Source: Ambulatory Visit | Attending: Surgery | Admitting: Surgery

## 2015-08-19 DIAGNOSIS — K4090 Unilateral inguinal hernia, without obstruction or gangrene, not specified as recurrent: Secondary | ICD-10-CM | POA: Diagnosis not present

## 2015-08-19 DIAGNOSIS — R1030 Lower abdominal pain, unspecified: Secondary | ICD-10-CM | POA: Insufficient documentation

## 2015-08-19 MED ORDER — IOHEXOL 350 MG/ML SOLN
100.0000 mL | Freq: Once | INTRAVENOUS | Status: AC | PRN
  Administered 2015-08-19: 100 mL via INTRAVENOUS

## 2015-08-20 ENCOUNTER — Ambulatory Visit: Payer: Self-pay

## 2015-08-25 ENCOUNTER — Telehealth: Payer: Self-pay

## 2015-08-25 NOTE — Telephone Encounter (Signed)
Patient calls in with complaints of pain at this time and he is out of his pain medication from the ER. Explained that we cannot prescribe him pain medication without being seen by a surgeon to determine problem and he is scheduled for first available appointment already. Patient denies firm bulge, vomiting, or changes in bowel habits. Explained that he may pick up a Truss to see if this might help, also encouraged him to try Ibuprofen 600mg  TID and/or icepacks to area to see if this will help with some inflammation and in turn help with pain. Only other option would be ER or urgent care for pain medication. Pt states, "Urgent Care won't give me nothing for pain and the ER costs me $300 bucks." Apologized for inconvenience but reiterated need for surgeon to lay eyes on him prior to prescribing pain medication. Patient states he will keep appointment for 12/16 with Dr. Burt Knack and "suffer through until then."

## 2015-08-27 ENCOUNTER — Encounter: Payer: Managed Care, Other (non HMO) | Admitting: Family Medicine

## 2015-08-29 ENCOUNTER — Encounter: Payer: Self-pay | Admitting: Surgery

## 2015-08-29 ENCOUNTER — Ambulatory Visit (INDEPENDENT_AMBULATORY_CARE_PROVIDER_SITE_OTHER): Payer: Managed Care, Other (non HMO) | Admitting: Surgery

## 2015-08-29 VITALS — BP 174/123 | HR 106 | Temp 98.3°F | Wt 203.0 lb

## 2015-08-29 DIAGNOSIS — R1030 Lower abdominal pain, unspecified: Secondary | ICD-10-CM | POA: Diagnosis not present

## 2015-08-29 NOTE — Progress Notes (Signed)
Outpatient Surgical Follow Up  08/29/2015  David Carlson is an 44 y.o. male.   CC: Groin pain, left  HPI: Patient has left groin pain following a laparoscopic robotic inguinal hernia repair on the left 13 months ago by Dr. Rexene Edison. He has ongoing pain which has not improved nor worsened. It does not affect his ability to work.  Past Medical History  Diagnosis Date  . Hypertension   . Hyperlipidemia     Past Surgical History  Procedure Laterality Date  . Hernia repair      Family History  Problem Relation Age of Onset  . Diabetes Mother   . Hypertension Father   . Heart disease Maternal Grandmother   . Diabetes Maternal Grandfather     Social History:  reports that he has been smoking Cigarettes.  He has been smoking about 1.00 pack per day. He has never used smokeless tobacco. He reports that he drinks alcohol. He reports that he does not use illicit drugs.  Allergies: No Known Allergies  Medications reviewed.   Review of Systems:   Review of Systems  Constitutional: Negative.   HENT: Negative.   Eyes: Negative.   Respiratory: Negative.   Cardiovascular: Negative.   Gastrointestinal: Negative for heartburn, nausea, vomiting, abdominal pain and diarrhea.  Genitourinary: Negative for dysuria and urgency.  Musculoskeletal: Negative.   Skin: Negative.   Endo/Heme/Allergies:       Left groin pain no right-sided groin pain no testicular pain     Physical Exam:  BP 174/123 mmHg  Pulse 106  Temp(Src) 98.3 F (36.8 C) (Oral)  Wt 203 lb (92.08 kg)  Physical Exam  Constitutional: He is well-developed, well-nourished, and in no distress. No distress.  HENT:  Head: Normocephalic and atraumatic.  Eyes: Pupils are equal, round, and reactive to light. Right eye exhibits no discharge. Left eye exhibits no discharge. No scleral icterus.  Abdominal: Soft. There is no tenderness.  Genitourinary: Penis normal.  Patient examined supine and standing with  Valsalva no sign of recurrent left inguinal hernia and nontender at the internal ring. Normal testicles bilaterally on the right side there is no palpable inguinal hernia  Musculoskeletal: Normal range of motion. He exhibits no edema or tenderness.      No results found for this or any previous visit (from the past 48 hour(s)). No results found.  Assessment/Plan:  CT scan results reviewed. No sign of recurrent left inguinal hernia scar tissue present as expected. On the right side there is a small fat-containing hernia which is not palpable on exam. Etiology of the patient's pain is likely secondary to scar tissue is 13 months out from surgery and I see no surgical intervention for this patient his pain is not typical of nerve entrapment. Therefore I would not suggest pain consult for injections. He will follow up on an as-needed basis or if his right side worsens or becomes  symptomatic  Florene Glen, MD, FACS

## 2015-09-17 ENCOUNTER — Ambulatory Visit: Payer: Managed Care, Other (non HMO) | Admitting: Family Medicine

## 2015-10-01 ENCOUNTER — Ambulatory Visit (INDEPENDENT_AMBULATORY_CARE_PROVIDER_SITE_OTHER): Payer: Managed Care, Other (non HMO) | Admitting: Family Medicine

## 2015-10-01 ENCOUNTER — Encounter: Payer: Self-pay | Admitting: Family Medicine

## 2015-10-01 VITALS — BP 120/95 | HR 80 | Temp 97.9°F | Resp 19 | Ht 71.0 in | Wt 202.0 lb

## 2015-10-01 DIAGNOSIS — I10 Essential (primary) hypertension: Secondary | ICD-10-CM

## 2015-10-01 DIAGNOSIS — Z0000 Encounter for general adult medical examination without abnormal findings: Secondary | ICD-10-CM | POA: Diagnosis not present

## 2015-10-01 DIAGNOSIS — R5383 Other fatigue: Secondary | ICD-10-CM | POA: Insufficient documentation

## 2015-10-01 DIAGNOSIS — R5382 Chronic fatigue, unspecified: Secondary | ICD-10-CM

## 2015-10-01 MED ORDER — LOSARTAN POTASSIUM 50 MG PO TABS: 75.0000 mg | ORAL_TABLET | Freq: Every day | ORAL | Status: DC

## 2015-10-01 NOTE — Progress Notes (Signed)
Name: David Carlson   MRN: [id number]    DOB: [birth year]   Date:10/01/2015       Progress Note  Subjective  Chief Complaint  Chief Complaint  Patient presents with  . Annual Exam    CPE    Hypertension This is a chronic problem. The problem is controlled. Associated symptoms include malaise/fatigue (stays tired) and neck pain. Pertinent negatives include no blurred vision, chest pain, headaches, palpitations or shortness of breath. Past treatments include angiotensin blockers. The current treatment provides moderate improvement. There are no compliance problems.  There is no history of kidney disease, CAD/MI or CVA.    Pt. Is here for an Annual Physical Exam and lab work. This is offered through his employment for insurance purposes.  Past Medical History  Diagnosis Date  . Hypertension   . Hyperlipidemia     Past Surgical History  Procedure Laterality Date  . Hernia repair      Family History  Problem Relation Age of Onset  . Diabetes Mother   . Hypertension Father   . Heart disease Maternal Grandmother   . Diabetes Maternal Grandfather     Social History   Social History  . Marital Status: Single    Spouse Name: N/A  . Number of Children: N/A  . Years of Education: N/A   Occupational History  . Not on file.   Social History Main Topics  . Smoking status: Current Every Day Smoker -- 1.00 packs/day    Types: Cigarettes  . Smokeless tobacco: Never Used  . Alcohol Use: 0.0 oz/week    0 Standard drinks or equivalent per week     Comment: occas  . Drug Use: No  . Sexual Activity: Not on file   Other Topics Concern  . Not on file   Social History Narrative     Current outpatient prescriptions:  .  atorvastatin (LIPITOR) 10 MG tablet, Take 1 tablet (10 mg total) by mouth daily., Disp: 90 tablet, Rfl: 0 .  clonazePAM (KLONOPIN) 0.5 MG tablet, Take 1 tablet (0.5 mg total) by mouth 3 (three) times daily as needed for anxiety., Disp: 90 tablet, Rfl:  2 .  ibuprofen (ADVIL,MOTRIN) 200 MG tablet, Take 200 mg by mouth every 6 (six) hours as needed., Disp: , Rfl:  .  losartan (COZAAR) 50 MG tablet, Take 1 tablet (50 mg total) by mouth daily., Disp: 30 tablet, Rfl: 2  No Known Allergies   Review of Systems  Constitutional: Positive for malaise/fatigue (stays tired). Negative for fever, chills and weight loss.  HENT: Positive for congestion. Negative for ear pain and sore throat.   Eyes: Negative for blurred vision and double vision.  Respiratory: Positive for cough. Negative for sputum production, shortness of breath and wheezing.   Cardiovascular: Negative for chest pain, palpitations and leg swelling.  Gastrointestinal: Positive for heartburn, abdominal pain (left sided upper abdominal pain coming from left back. left lower abdomen/groin pain from hernia repair), diarrhea (in the last 2 weeks, has been having loose stools alternating with constipation) and constipation. Negative for nausea and vomiting.  Genitourinary: Positive for flank pain. Negative for dysuria and hematuria.  Musculoskeletal: Positive for back pain (intermittent low back pain radiating to left upper abdomen) and neck pain. Negative for myalgias.  Skin: Negative for rash (intermittent rash from eczema on the back of his leg).  Neurological: Positive for dizziness (described a s 'foggines'). Negative for loss of consciousness and headaches.  Psychiatric/Behavioral: Negative for depression. The patient is  nervous/anxious. The patient does not have insomnia.     Objective  Filed Vitals:   10/01/15 0904  BP: 141/70  Pulse: 80  Temp: 97.9 F (36.6 C)  TempSrc: Oral  Resp: 19  Height: 5\' 11"  (1.803 m)  Weight: 202 lb (91.627 kg)  SpO2: 97%    Physical Exam  Constitutional: He is oriented to person, place, and time and well-developed, well-nourished, and in no distress.  HENT:  Head: Normocephalic and atraumatic.  Eyes:  Right pupil sluggish to light, left pupil  normal  Neck: Normal range of motion. Neck supple.  Cardiovascular: Normal rate and regular rhythm.   No murmur heard. Pulmonary/Chest: Effort normal and breath sounds normal. He has no wheezes. He has no rales.  Abdominal: Soft. Bowel sounds are normal. There is no tenderness.  Genitourinary:  deferred  Musculoskeletal:       Thoracic back: He exhibits tenderness.       Back:  Tenderness in the left thoracic paraspinal area.  Neurological: He is alert and oriented to person, place, and time.  Skin: Skin is warm and dry.  Psychiatric: Mood, memory, affect and judgment normal.  Nursing note and vitals reviewed.   Assessment & Plan  1. Encounter for annual health examination We will complete the physical exam form once lab work available. - CBC with Differential - Comprehensive Metabolic Panel (CMET) - Lipid Profile  2. Chronic fatigue  - Testosterone - Vitamin D (25 hydroxy) - TSH  3. Essential hypertension  - losartan (COZAAR) 50 MG tablet; Take 1.5 tablets (75 mg total) by mouth daily.  Dispense: 45 tablet; Refill: 2   Shawnice Tilmon [facility] 10/01/2015 9:20 AM

## 2015-10-04 LAB — CBC WITH DIFFERENTIAL/PLATELET
BASOS ABS: 0 10*3/uL (ref 0.0–0.2)
Basos: 0 %
EOS (ABSOLUTE): 0.2 10*3/uL (ref 0.0–0.4)
Eos: 2 %
Hematocrit: 41.1 % (ref 37.5–51.0)
Hemoglobin: 14.1 g/dL (ref 12.6–17.7)
IMMATURE GRANS (ABS): 0 10*3/uL (ref 0.0–0.1)
IMMATURE GRANULOCYTES: 0 %
LYMPHS: 34 %
Lymphocytes Absolute: 2.1 10*3/uL (ref 0.7–3.1)
MCH: 30.3 pg (ref 26.6–33.0)
MCHC: 34.3 g/dL (ref 31.5–35.7)
MCV: 88 fL (ref 79–97)
MONOS ABS: 0.5 10*3/uL (ref 0.1–0.9)
Monocytes: 7 %
NEUTROS PCT: 57 %
Neutrophils Absolute: 3.5 10*3/uL (ref 1.4–7.0)
PLATELETS: 237 10*3/uL (ref 150–379)
RBC: 4.66 x10E6/uL (ref 4.14–5.80)
RDW: 13.4 % (ref 12.3–15.4)
WBC: 6.2 10*3/uL (ref 3.4–10.8)

## 2015-10-04 LAB — TESTOSTERONE: TESTOSTERONE: 367 ng/dL (ref 348–1197)

## 2015-10-04 LAB — COMPREHENSIVE METABOLIC PANEL
ALT: 32 IU/L (ref 0–44)
AST: 22 IU/L (ref 0–40)
Albumin/Globulin Ratio: 1.7 (ref 1.1–2.5)
Albumin: 4.3 g/dL (ref 3.5–5.5)
Alkaline Phosphatase: 66 IU/L (ref 39–117)
BUN/Creatinine Ratio: 14 (ref 9–20)
BUN: 14 mg/dL (ref 6–24)
Bilirubin Total: 0.4 mg/dL (ref 0.0–1.2)
CALCIUM: 9.3 mg/dL (ref 8.7–10.2)
CHLORIDE: 102 mmol/L (ref 96–106)
CO2: 24 mmol/L (ref 18–29)
Creatinine, Ser: 0.98 mg/dL (ref 0.76–1.27)
GFR, EST AFRICAN AMERICAN: 109 mL/min/{1.73_m2} (ref >59)
GFR, EST NON AFRICAN AMERICAN: 94 mL/min/{1.73_m2} (ref >59)
GLUCOSE: 85 mg/dL (ref 65–99)
Globulin, Total: 2.6 g/dL (ref 1.5–4.5)
POTASSIUM: 4.4 mmol/L (ref 3.5–5.2)
Sodium: 141 mmol/L (ref 134–144)
TOTAL PROTEIN: 6.9 g/dL (ref 6.0–8.5)

## 2015-10-04 LAB — LIPID PANEL
Chol/HDL Ratio: 4.4 ratio units (ref 0.0–5.0)
Cholesterol, Total: 164 mg/dL (ref 100–199)
HDL: 37 mg/dL — AB (ref >39)
LDL Calculated: 84 mg/dL (ref 0–99)
TRIGLYCERIDES: 214 mg/dL — AB (ref 0–149)
VLDL CHOLESTEROL CAL: 43 mg/dL — AB (ref 5–40)

## 2015-10-04 LAB — VITAMIN D 25 HYDROXY (VIT D DEFICIENCY, FRACTURES): Vit D, 25-Hydroxy: 21.1 ng/mL — ABNORMAL LOW (ref 30.0–100.0)

## 2015-10-04 LAB — TSH: TSH: 0.824 u[IU]/mL (ref 0.450–4.500)

## 2015-10-10 ENCOUNTER — Telehealth: Payer: Self-pay

## 2015-10-10 ENCOUNTER — Ambulatory Visit: Payer: Managed Care, Other (non HMO) | Admitting: Family Medicine

## 2015-10-10 MED ORDER — VITAMIN D (ERGOCALCIFEROL) 1.25 MG (50000 UNIT) PO CAPS: 50000.0000 [IU] | ORAL_CAPSULE | ORAL | Status: DC

## 2015-10-10 NOTE — Telephone Encounter (Signed)
Prescription for Vitamin D3 50,000 units has been sent to [facility] per Dr. Manuella Ghazi

## 2015-10-22 ENCOUNTER — Telehealth: Payer: Self-pay | Admitting: Family Medicine

## 2015-10-22 DIAGNOSIS — E782 Mixed hyperlipidemia: Secondary | ICD-10-CM

## 2015-10-22 NOTE — Telephone Encounter (Signed)
Pt needs refill on Lipitor and [place].

## 2015-10-23 ENCOUNTER — Ambulatory Visit: Payer: Managed Care, Other (non HMO) | Admitting: Family Medicine

## 2015-10-23 MED ORDER — ATORVASTATIN CALCIUM 10 MG PO TABS: 10.0000 mg | ORAL_TABLET | Freq: Every day | ORAL | Status: DC

## 2015-10-23 NOTE — Telephone Encounter (Signed)
Lipitor has been refilled and sent to [facility] has not been refilled patient will need to make a medication refill appointment and he has been notified

## 2015-12-25 ENCOUNTER — Ambulatory Visit: Payer: Managed Care, Other (non HMO) | Admitting: Family Medicine

## 2016-01-13 ENCOUNTER — Ambulatory Visit (INDEPENDENT_AMBULATORY_CARE_PROVIDER_SITE_OTHER): Payer: Managed Care, Other (non HMO) | Admitting: Family Medicine

## 2016-01-13 ENCOUNTER — Encounter: Payer: Self-pay | Admitting: Family Medicine

## 2016-01-13 VITALS — BP 110/82 | HR 95 | Temp 97.9°F | Resp 18 | Ht 71.0 in | Wt 208.8 lb

## 2016-01-13 DIAGNOSIS — E559 Vitamin D deficiency, unspecified: Secondary | ICD-10-CM

## 2016-01-13 DIAGNOSIS — F411 Generalized anxiety disorder: Secondary | ICD-10-CM | POA: Diagnosis not present

## 2016-01-13 DIAGNOSIS — I10 Essential (primary) hypertension: Secondary | ICD-10-CM | POA: Diagnosis not present

## 2016-01-13 DIAGNOSIS — N529 Male erectile dysfunction, unspecified: Secondary | ICD-10-CM | POA: Diagnosis not present

## 2016-01-13 DIAGNOSIS — E782 Mixed hyperlipidemia: Secondary | ICD-10-CM

## 2016-01-13 MED ORDER — SILDENAFIL CITRATE 50 MG PO TABS
50.0000 mg | ORAL_TABLET | Freq: Every day | ORAL | Status: DC | PRN
Start: 2016-01-13 — End: 2016-02-12

## 2016-01-13 MED ORDER — CLONAZEPAM 0.5 MG PO TABS: 0.5000 mg | ORAL_TABLET | Freq: Three times a day (TID) | ORAL | Status: DC | PRN

## 2016-01-13 MED ORDER — LOSARTAN POTASSIUM 50 MG PO TABS: 75.0000 mg | ORAL_TABLET | Freq: Every day | ORAL | Status: DC

## 2016-01-13 NOTE — Progress Notes (Signed)
Name: David Carlson   MRN: [id number]    DOB: April 08, 1971   Date:01/13/2016       Progress Note  Subjective  Chief Complaint  Chief Complaint  Patient presents with  . Hypertension    3 month follow up  . Hyperlipidemia    stopped medication due to joint pain for 1 month    Hypertension This is a chronic problem. The problem is unchanged. The problem is controlled. Associated symptoms include anxiety and neck pain. Pertinent negatives include no blurred vision, chest pain, headaches, palpitations or shortness of breath. Past treatments include angiotensin blockers. There is no history of kidney disease, CAD/MI or CVA.  Hyperlipidemia This is a chronic problem. The problem is uncontrolled. Recent lipid tests were reviewed and are high. Associated symptoms include myalgias. Pertinent negatives include no chest pain or shortness of breath. He is currently on no antihyperlipidemic treatment (Stopped Lipitor 10 mg 3 weeks ago due to myalgias.). Risk factors for coronary artery disease include dyslipidemia and male sex.  Anxiety Presents for follow-up visit. The problem has been gradually improving. Symptoms include excessive worry and nervous/anxious behavior. Patient reports no chest pain, palpitations or shortness of breath. Primary symptoms comment: Stress at work, sometimes causes neck pain, . The severity of symptoms is moderate.   His past medical history is significant for anxiety/panic attacks. Past treatments include benzodiazephines. The treatment provided significant relief. Compliance with prior treatments has been good.  Erectile Dysfunction This is a chronic problem. Episode onset: At least 6 months. Non-physiologic factors contributing to erectile dysfunction are anxiety. Irritative symptoms do not include frequency or urgency. Obstructive symptoms do not include dribbling or an intermittent stream. Pertinent negatives include no dysuria. Past treatments include nothing.     Past Medical History  Diagnosis Date  . Hypertension   . Hyperlipidemia     Past Surgical History  Procedure Laterality Date  . Hernia repair      Family History  Problem Relation Age of Onset  . Diabetes Mother   . Hypertension Father   . Heart disease Maternal Grandmother   . Diabetes Maternal Grandfather     Social History   Social History  . Marital Status: Single    Spouse Name: N/A  . Number of Children: N/A  . Years of Education: N/A   Occupational History  . Not on file.   Social History Main Topics  . Smoking status: Former Smoker -- 1.00 packs/day    Types: Cigarettes    Quit date: 09/15/2015  . Smokeless tobacco: Never Used  . Alcohol Use: 0.0 oz/week    0 Standard drinks or equivalent per week     Comment: occas  . Drug Use: No  . Sexual Activity: Not on file   Other Topics Concern  . Not on file   Social History Narrative    Current outpatient prescriptions:  .  clonazePAM (KLONOPIN) 0.5 MG tablet, Take 1 tablet (0.5 mg total) by mouth 3 (three) times daily as needed for anxiety., Disp: 90 tablet, Rfl: 2 .  ibuprofen (ADVIL,MOTRIN) 200 MG tablet, Take 200 mg by mouth every 6 (six) hours as needed., Disp: , Rfl:  .  losartan (COZAAR) 50 MG tablet, Take 1.5 tablets (75 mg total) by mouth daily., Disp: 45 tablet, Rfl: 2  No Known Allergies   Review of Systems  Eyes: Negative for blurred vision and double vision.  Respiratory: Negative for shortness of breath.   Cardiovascular: Negative for chest pain and palpitations.  Genitourinary: Negative for dysuria, urgency and frequency.  Musculoskeletal: Positive for myalgias and neck pain.  Neurological: Negative for headaches.  Psychiatric/Behavioral: Negative for depression. The patient is nervous/anxious.     Objective  Filed Vitals:   01/13/16 1002  BP: 110/82  Pulse: 95  Temp: 97.9 F (36.6 C)  TempSrc: Oral  Resp: 18  Height: 5\' 11"  (1.803 m)  Weight: 208 lb 12.8 oz (94.711 kg)   SpO2: 95%    Physical Exam  Constitutional: He is oriented to person, place, and time and well-developed, well-nourished, and in no distress.  HENT:  Head: Normocephalic and atraumatic.  Eyes: Right pupil is not reactive.  Right pupil not reactive to light.  Cardiovascular: Normal rate and regular rhythm.   Pulmonary/Chest: Effort normal and breath sounds normal.  Abdominal: Soft. Bowel sounds are normal.  Neurological: He is alert and oriented to person, place, and time.  Psychiatric: Mood, memory, affect and judgment normal.  Nursing note and vitals reviewed.    Assessment & Plan  1. Essential hypertension Blood pressure is stable and controlled on present therapy - losartan (COZAAR) 50 MG tablet; Take 1.5 tablets (75 mg total) by mouth daily.  Dispense: 45 tablet; Refill: 2  2. Generalized anxiety disorder Symptoms stable and controlled on clonazepam taken up to 3 times daily as needed - clonazePAM (KLONOPIN) 0.5 MG tablet; Take 1 tablet (0.5 mg total) by mouth 3 (three) times daily as needed for anxiety.  Dispense: 90 tablet; Refill: 2  3. Hypercholesterolemia with hypertriglyceridemia  - Lipid Profile  4. Vitamin D deficiency  - Vitamin D (25 hydroxy)  5. Erectile dysfunction, unspecified erectile dysfunction type  - sildenafil (VIAGRA) 50 MG tablet; Take 1 tablet (50 mg total) by mouth daily as needed for erectile dysfunction.  Dispense: 10 tablet; Refill: 0   David Carlson [facility] 01/13/2016 10:13 AM

## 2016-01-14 LAB — LIPID PANEL
Chol/HDL Ratio: 5.6 ratio units — ABNORMAL HIGH (ref 0.0–5.0)
Cholesterol, Total: 196 mg/dL (ref 100–199)
HDL: 35 mg/dL — ABNORMAL LOW (ref >39)
LDL CALC: 103 mg/dL — AB (ref 0–99)
Triglycerides: 291 mg/dL — ABNORMAL HIGH (ref 0–149)
VLDL CHOLESTEROL CAL: 58 mg/dL — AB (ref 5–40)

## 2016-01-14 LAB — VITAMIN D 25 HYDROXY (VIT D DEFICIENCY, FRACTURES): Vit D, 25-Hydroxy: 31.7 ng/mL (ref 30.0–100.0)

## 2016-02-12 ENCOUNTER — Telehealth: Payer: Self-pay | Admitting: Family Medicine

## 2016-02-12 DIAGNOSIS — N529 Male erectile dysfunction, unspecified: Secondary | ICD-10-CM

## 2016-02-12 MED ORDER — SILDENAFIL CITRATE 50 MG PO TABS: 50.0000 mg | ORAL_TABLET | Freq: Every day | ORAL | Status: DC | PRN

## 2016-02-12 NOTE — Telephone Encounter (Signed)
PT SAID THAT THE PHARM HAS SENT MULTIPLE REQUEST FOR HIS VIAGRA WITH NO RESPONSE. PT NEEDS REFILLS [place]

## 2016-02-12 NOTE — Telephone Encounter (Signed)
Prescription for Viagra is sent to his pharmacy

## 2016-04-14 ENCOUNTER — Ambulatory Visit: Payer: Managed Care, Other (non HMO) | Admitting: Family Medicine

## 2016-05-31 ENCOUNTER — Telehealth: Payer: Self-pay | Admitting: Family Medicine

## 2016-05-31 DIAGNOSIS — I10 Essential (primary) hypertension: Secondary | ICD-10-CM

## 2016-05-31 MED ORDER — LOSARTAN POTASSIUM 50 MG PO TABS: 75.0000 mg | ORAL_TABLET | Freq: Every day | ORAL | 0 refills | Status: DC

## 2016-06-02 ENCOUNTER — Ambulatory Visit: Payer: Managed Care, Other (non HMO) | Admitting: Family Medicine

## 2016-06-07 NOTE — Telephone Encounter (Signed)
Please close encounter. Prescription has been sent.

## 2016-06-15 ENCOUNTER — Encounter: Payer: Self-pay | Admitting: Family Medicine

## 2016-06-15 ENCOUNTER — Ambulatory Visit (INDEPENDENT_AMBULATORY_CARE_PROVIDER_SITE_OTHER): Payer: Managed Care, Other (non HMO) | Admitting: Family Medicine

## 2016-06-15 VITALS — BP 130/82 | HR 89 | Temp 97.9°F | Resp 18 | Ht 71.0 in | Wt 217.0 lb

## 2016-06-15 DIAGNOSIS — F411 Generalized anxiety disorder: Secondary | ICD-10-CM

## 2016-06-15 DIAGNOSIS — I10 Essential (primary) hypertension: Secondary | ICD-10-CM

## 2016-06-15 DIAGNOSIS — N529 Male erectile dysfunction, unspecified: Secondary | ICD-10-CM

## 2016-06-15 DIAGNOSIS — E782 Mixed hyperlipidemia: Secondary | ICD-10-CM

## 2016-06-15 MED ORDER — SILDENAFIL CITRATE 50 MG PO TABS: 50.0000 mg | ORAL_TABLET | Freq: Every day | ORAL | 0 refills | Status: DC | PRN

## 2016-06-15 MED ORDER — LOSARTAN POTASSIUM 50 MG PO TABS: 75.0000 mg | ORAL_TABLET | Freq: Every day | ORAL | 0 refills | Status: DC

## 2016-06-15 MED ORDER — CLONAZEPAM 0.5 MG PO TABS: 0.5000 mg | ORAL_TABLET | Freq: Three times a day (TID) | ORAL | 2 refills | Status: DC | PRN

## 2016-06-15 MED ORDER — CITALOPRAM HYDROBROMIDE 20 MG PO TABS: 20.0000 mg | ORAL_TABLET | Freq: Every day | ORAL | 0 refills | Status: DC

## 2016-06-15 NOTE — Progress Notes (Signed)
Name: David Carlson   MRN: [id number]    DOB: [birth year]   Date:06/15/2016       Progress Note  Subjective  Chief Complaint  Chief Complaint  Patient presents with  . Hypertension    follow up medication refills  . Anxiety    Hypertension  This is a chronic problem. The problem is unchanged. The problem is controlled. Associated symptoms include neck pain. Pertinent negatives include no anxiety, blurred vision, chest pain, headaches, palpitations or shortness of breath. Past treatments include angiotensin blockers. There is no history of kidney disease, CAD/MI or CVA.  Anxiety  Presents for follow-up visit. Symptoms include depressed mood (feels fatigued, lack of motivation or 'drive'), excessive worry, insomnia and nervous/anxious behavior. Patient reports no chest pain, palpitations or shortness of breath. Primary symptoms comment: Stress at work, sometimes causes neck pain, . The severity of symptoms is moderate.    Hyperlipidemia  This is a chronic problem. The problem is uncontrolled. Recent lipid tests were reviewed and are high. Pertinent negatives include no chest pain, myalgias or shortness of breath. He is currently on no antihyperlipidemic treatment (Stopped Lipitor 10 mg 3 weeks ago due to myalgias.). Risk factors for coronary artery disease include dyslipidemia and male sex.  Erectile Dysfunction  This is a chronic problem. Episode onset: At least 6 months. The nature of his difficulty is achieving erection and maintaining erection. Non-physiologic factors contributing to erectile dysfunction are anxiety. Irritative symptoms do not include frequency or urgency. Obstructive symptoms do not include dribbling or an intermittent stream. Pertinent negatives include no dysuria. Past treatments include sildenafil.    Past Medical History:  Diagnosis Date  . Hyperlipidemia   . Hypertension     Past Surgical History:  Procedure Laterality Date  . HERNIA REPAIR       Family History  Problem Relation Age of Onset  . Diabetes Mother   . Hypertension Father   . Heart disease Maternal Grandmother   . Diabetes Maternal Grandfather     Social History   Social History  . Marital status: Single    Spouse name: N/A  . Number of children: N/A  . Years of education: N/A   Occupational History  . Not on file.   Social History Main Topics  . Smoking status: Former Smoker    Packs/day: 1.00    Types: Cigarettes    Quit date: 09/15/2015  . Smokeless tobacco: Never Used  . Alcohol use 0.0 oz/week     Comment: occas  . Drug use: No  . Sexual activity: Not on file   Other Topics Concern  . Not on file   Social History Narrative  . No narrative on file     Current Outpatient Prescriptions:  .  clonazePAM (KLONOPIN) 0.5 MG tablet, Take 1 tablet (0.5 mg total) by mouth 3 (three) times daily as needed for anxiety., Disp: 90 tablet, Rfl: 2 .  ibuprofen (ADVIL,MOTRIN) 200 MG tablet, Take 200 mg by mouth every 6 (six) hours as needed., Disp: , Rfl:  .  losartan (COZAAR) 50 MG tablet, Take 1.5 tablets (75 mg total) by mouth daily., Disp: 45 tablet, Rfl: 0 .  sildenafil (VIAGRA) 50 MG tablet, Take 1 tablet (50 mg total) by mouth daily as needed for erectile dysfunction., Disp: 10 tablet, Rfl: 0  No Known Allergies   Review of Systems  Eyes: Negative for blurred vision.  Respiratory: Negative for shortness of breath.   Cardiovascular: Negative for chest pain and palpitations.  Genitourinary: Negative for dysuria, frequency and urgency.  Musculoskeletal: Positive for neck pain. Negative for myalgias.  Neurological: Negative for headaches.  Psychiatric/Behavioral: The patient is nervous/anxious and has insomnia.      Objective  Vitals:   06/15/16 1030  BP: 130/82  Pulse: 89  Resp: 18  Temp: 97.9 F (36.6 C)  TempSrc: Oral  SpO2: 97%  Weight: 217 lb (98.4 kg)  Height: 5\' 11"  (1.803 m)    Physical Exam  Constitutional: He is oriented  to person, place, and time and well-developed, well-nourished, and in no distress.  HENT:  Head: Normocephalic and atraumatic.  Cardiovascular: Normal rate, regular rhythm and normal heart sounds.   No murmur heard. Pulmonary/Chest: Effort normal and breath sounds normal. He has no wheezes.  Abdominal: Soft. Bowel sounds are normal. There is no tenderness.  Neurological: He is alert and oriented to person, place, and time.  Psychiatric: Mood, memory, affect and judgment normal.  Nursing note and vitals reviewed.    Assessment & Plan  1. Essential hypertension BP stable on present therapy - losartan (COZAAR) 50 MG tablet; Take 1.5 tablets (75 mg total) by mouth daily.  Dispense: 135 tablet; Refill: 0  2. Generalized anxiety disorder Started on citalopram 20 mg to improve his energy, mood, relieve symptoms of fatigue. - clonazePAM (KLONOPIN) 0.5 MG tablet; Take 1 tablet (0.5 mg total) by mouth 3 (three) times daily as needed for anxiety.  Dispense: 90 tablet; Refill: 2 - citalopram (CELEXA) 20 MG tablet; Take 1 tablet (20 mg total) by mouth daily.  Dispense: 90 tablet; Refill: 0  3. Erectile dysfunction, unspecified erectile dysfunction type  - sildenafil (VIAGRA) 50 MG tablet; Take 1 tablet (50 mg total) by mouth daily as needed for erectile dysfunction.  Dispense: 6 tablet; Refill: 0  4. Hypercholesterolemia with hypertriglyceridemia  - Lipid Profile   Joel Cowin [facility] 06/15/2016 11:10 AM

## 2016-06-16 LAB — LIPID PANEL
CHOL/HDL RATIO: 6.2 ratio — AB (ref <=5.0)
CHOLESTEROL: 222 mg/dL — AB (ref 125–200)
HDL: 36 mg/dL — AB (ref >=40)
LDL Cholesterol: 113 mg/dL (ref <130)
TRIGLYCERIDES: 363 mg/dL — AB (ref <150)
VLDL: 73 mg/dL — ABNORMAL HIGH (ref <30)

## 2016-09-10 ENCOUNTER — Telehealth: Payer: Self-pay | Admitting: Family Medicine

## 2016-09-10 DIAGNOSIS — F411 Generalized anxiety disorder: Secondary | ICD-10-CM

## 2016-09-10 DIAGNOSIS — I10 Essential (primary) hypertension: Secondary | ICD-10-CM

## 2016-09-10 MED ORDER — LOSARTAN POTASSIUM 50 MG PO TABS: 75.0000 mg | ORAL_TABLET | Freq: Every day | ORAL | 0 refills | Status: DC

## 2016-09-10 MED ORDER — CLONAZEPAM 0.5 MG PO TABS: 0.5000 mg | ORAL_TABLET | Freq: Three times a day (TID) | ORAL | 2 refills | Status: DC | PRN

## 2016-09-10 NOTE — Telephone Encounter (Signed)
Pt verbally informed and will pick up prescription next week.

## 2016-09-10 NOTE — Telephone Encounter (Signed)
Rx for  clonazepam is printed and ready for pickup, prescription for losartan is sent to pharmacy

## 2016-09-10 NOTE — Telephone Encounter (Signed)
Pt had appointment for 09/16/16 but had to resch for 09/27/16 due to you not being in the office. He is asking that you please send a refill of losartan and clonazepam to [facility]. Patient did state that he did not get the clonazepam refilled from last time that it has expired.  Also patient states that he never received a call about lab results from last visit.

## 2016-09-16 ENCOUNTER — Ambulatory Visit: Payer: Managed Care, Other (non HMO) | Admitting: Family Medicine

## 2016-09-27 ENCOUNTER — Ambulatory Visit (INDEPENDENT_AMBULATORY_CARE_PROVIDER_SITE_OTHER): Payer: Managed Care, Other (non HMO) | Admitting: Family Medicine

## 2016-09-27 ENCOUNTER — Encounter: Payer: Self-pay | Admitting: Family Medicine

## 2016-09-27 VITALS — BP 110/70 | HR 100 | Temp 98.2°F | Resp 16 | Ht 71.0 in | Wt 217.4 lb

## 2016-09-27 DIAGNOSIS — F411 Generalized anxiety disorder: Secondary | ICD-10-CM | POA: Diagnosis not present

## 2016-09-27 DIAGNOSIS — K219 Gastro-esophageal reflux disease without esophagitis: Secondary | ICD-10-CM

## 2016-09-27 DIAGNOSIS — E782 Mixed hyperlipidemia: Secondary | ICD-10-CM

## 2016-09-27 MED ORDER — PANTOPRAZOLE SODIUM 40 MG PO TBEC: 40.0000 mg | DELAYED_RELEASE_TABLET | Freq: Every day | ORAL | 0 refills | Status: DC

## 2016-09-27 MED ORDER — ESCITALOPRAM OXALATE 10 MG PO TABS: 10.0000 mg | ORAL_TABLET | Freq: Every day | ORAL | 0 refills | Status: DC

## 2016-09-27 MED ORDER — ATORVASTATIN CALCIUM 20 MG PO TABS: 20.0000 mg | ORAL_TABLET | Freq: Every day | ORAL | 0 refills | Status: DC

## 2016-09-27 NOTE — Progress Notes (Signed)
Name: David Carlson   MRN: [id number]    DOB: 1970-10-16   Date:09/27/2016       Progress Note  Subjective  Chief Complaint  Chief Complaint  Patient presents with  . Follow-up    medication refills    Hyperlipidemia  This is a chronic problem. The problem is uncontrolled. Recent lipid tests were reviewed and are high. Pertinent negatives include no chest pain. He is currently on no antihyperlipidemic treatment.  Anxiety  Presents for follow-up visit. Symptoms include excessive worry, muscle tension (tension in the neck caused by stress), nervous/anxious behavior and panic. Patient reports no chest pain, depressed mood or nausea. The severity of symptoms is moderate.    Gastroesophageal Reflux  He complains of abdominal pain, belching and heartburn. He reports no chest pain, no choking, no coughing or no nausea. This is a chronic problem. He has tried an antacid and a PPI (Has taken Rantitidine and Prilosec in the past, which did not work) for the symptoms. Past procedures do not include an EGD.    Past Medical History:  Diagnosis Date  . Hyperlipidemia   . Hypertension     Past Surgical History:  Procedure Laterality Date  . HERNIA REPAIR      Family History  Problem Relation Age of Onset  . Diabetes Mother   . Hypertension Father   . Heart disease Maternal Grandmother   . Diabetes Maternal Grandfather     Social History   Social History  . Marital status: Single    Spouse name: N/A  . Number of children: N/A  . Years of education: N/A   Occupational History  . Not on file.   Social History Main Topics  . Smoking status: Former Smoker    Packs/day: 1.00    Types: Cigarettes    Quit date: 09/15/2015  . Smokeless tobacco: Never Used  . Alcohol use 0.0 oz/week     Comment: occas  . Drug use: No  . Sexual activity: Not on file   Other Topics Concern  . Not on file   Social History Narrative  . No narrative on file     Current Outpatient  Prescriptions:  .  citalopram (CELEXA) 20 MG tablet, Take 1 tablet (20 mg total) by mouth daily., Disp: 90 tablet, Rfl: 0 .  clonazePAM (KLONOPIN) 0.5 MG tablet, Take 1 tablet (0.5 mg total) by mouth 3 (three) times daily as needed for anxiety., Disp: 90 tablet, Rfl: 2 .  ibuprofen (ADVIL,MOTRIN) 200 MG tablet, Take 200 mg by mouth every 6 (six) hours as needed., Disp: , Rfl:  .  losartan (COZAAR) 50 MG tablet, Take 1.5 tablets (75 mg total) by mouth daily., Disp: 135 tablet, Rfl: 0 .  sildenafil (VIAGRA) 50 MG tablet, Take 1 tablet (50 mg total) by mouth daily as needed for erectile dysfunction., Disp: 6 tablet, Rfl: 0  No Known Allergies   Review of Systems  Respiratory: Negative for cough and choking.   Cardiovascular: Negative for chest pain.  Gastrointestinal: Positive for abdominal pain and heartburn. Negative for nausea.  Psychiatric/Behavioral: The patient is nervous/anxious.      Objective  Vitals:   09/27/16 1046  BP: 110/70  Pulse: 100  Resp: 16  Temp: 98.2 F (36.8 C)  TempSrc: Oral  SpO2: 96%  Weight: 217 lb 6.4 oz (98.6 kg)  Height: 5\' 11"  (1.803 m)    Physical Exam  Constitutional: He is oriented to person, place, and time and well-developed, well-nourished, and  in no distress.  HENT:  Head: Normocephalic and atraumatic.  Cardiovascular: Normal rate, regular rhythm and normal heart sounds.   No murmur heard. Pulmonary/Chest: Effort normal and breath sounds normal. He has no wheezes.  Abdominal: Soft. Bowel sounds are normal. There is no tenderness.  Neurological: He is alert and oriented to person, place, and time.  Psychiatric: Mood, memory, affect and judgment normal.  Nursing note and vitals reviewed.     Assessment & Plan  1. Hypercholesterolemia with hypertriglyceridemia  - atorvastatin (LIPITOR) 20 MG tablet; Take 1 tablet (20 mg total) by mouth daily at 6 PM.  Dispense: 90 tablet; Refill: 0  2. Generalized anxiety disorder  - escitalopram  (LEXAPRO) 10 MG tablet; Take 1 tablet (10 mg total) by mouth daily.  Dispense: 90 tablet; Refill: 0  3. Gastroesophageal reflux disease, esophagitis presence not specified  - pantoprazole (PROTONIX) 40 MG tablet; Take 1 tablet (40 mg total) by mouth daily.  Dispense: 90 tablet; Refill: 0   Zurich Carreno [facility] 09/27/2016 10:59 AM

## 2016-10-15 ENCOUNTER — Encounter: Payer: Self-pay | Admitting: Family Medicine

## 2016-10-15 ENCOUNTER — Ambulatory Visit (INDEPENDENT_AMBULATORY_CARE_PROVIDER_SITE_OTHER): Payer: Managed Care, Other (non HMO) | Admitting: Family Medicine

## 2016-10-15 DIAGNOSIS — Z0000 Encounter for general adult medical examination without abnormal findings: Secondary | ICD-10-CM | POA: Diagnosis not present

## 2016-10-15 LAB — CBC WITH DIFFERENTIAL/PLATELET
BASOS PCT: 1 %
Basophils Absolute: 43 cells/uL (ref 0–200)
Eosinophils Absolute: 129 cells/uL (ref 15–500)
Eosinophils Relative: 3 %
HCT: 41.3 % (ref 38.5–50.0)
Hemoglobin: 14.2 g/dL (ref 13.2–17.1)
LYMPHS PCT: 38 %
Lymphs Abs: 1634 cells/uL (ref 850–3900)
MCH: 31.2 pg (ref 27.0–33.0)
MCHC: 34.4 g/dL (ref 32.0–36.0)
MCV: 90.8 fL (ref 80.0–100.0)
MONOS PCT: 9 %
MPV: 9.9 fL (ref 7.5–12.5)
Monocytes Absolute: 387 cells/uL (ref 200–950)
NEUTROS ABS: 2107 {cells}/uL (ref 1500–7800)
Neutrophils Relative %: 49 %
PLATELETS: 237 10*3/uL (ref 140–400)
RBC: 4.55 MIL/uL (ref 4.20–5.80)
RDW: 13.6 % (ref 11.0–15.0)
WBC: 4.3 10*3/uL (ref 3.8–10.8)

## 2016-10-15 LAB — COMPLETE METABOLIC PANEL WITH GFR
ALT: 37 U/L (ref 9–46)
AST: 29 U/L (ref 10–40)
Albumin: 4.5 g/dL (ref 3.6–5.1)
Alkaline Phosphatase: 48 U/L (ref 40–115)
BUN: 17 mg/dL (ref 7–25)
CHLORIDE: 106 mmol/L (ref 98–110)
CO2: 30 mmol/L (ref 20–31)
CREATININE: 0.94 mg/dL (ref 0.60–1.35)
Calcium: 9.1 mg/dL (ref 8.6–10.3)
GFR, Est African American: >89 mL/min (ref >=60)
GFR, Est Non African American: >89 mL/min (ref >=60)
GLUCOSE: 79 mg/dL (ref 65–99)
Potassium: 4.6 mmol/L (ref 3.5–5.3)
SODIUM: 140 mmol/L (ref 135–146)
TOTAL PROTEIN: 7.2 g/dL (ref 6.1–8.1)
Total Bilirubin: 0.9 mg/dL (ref 0.2–1.2)

## 2016-10-15 LAB — TSH: TSH: 0.59 mIU/L (ref 0.40–4.50)

## 2016-10-15 NOTE — Progress Notes (Signed)
Name: David Carlson   MRN: [id number]    DOB: 07/23/1971   Date:10/15/2016       Progress Note  Subjective  Chief Complaint  Chief Complaint  Patient presents with  . Annual Exam    HPI  Pt. Presents for Complete Physical Exam.   Past Medical History:  Diagnosis Date  . Hyperlipidemia   . Hypertension     Past Surgical History:  Procedure Laterality Date  . HERNIA REPAIR      Family History  Problem Relation Age of Onset  . Diabetes Mother   . Hypertension Father   . Heart disease Maternal Grandmother   . Diabetes Maternal Grandfather     Social History   Social History  . Marital status: Single    Spouse name: N/A  . Number of children: N/A  . Years of education: N/A   Occupational History  . Not on file.   Social History Main Topics  . Smoking status: Former Smoker    Packs/day: 1.00    Types: Cigarettes    Quit date: 09/15/2015  . Smokeless tobacco: Never Used  . Alcohol use 0.0 oz/week     Comment: occas  . Drug use: No  . Sexual activity: Not on file   Other Topics Concern  . Not on file   Social History Narrative  . No narrative on file     Current Outpatient Prescriptions:  .  atorvastatin (LIPITOR) 20 MG tablet, Take 1 tablet (20 mg total) by mouth daily at 6 PM., Disp: 90 tablet, Rfl: 0 .  clonazePAM (KLONOPIN) 0.5 MG tablet, Take 1 tablet (0.5 mg total) by mouth 3 (three) times daily as needed for anxiety., Disp: 90 tablet, Rfl: 2 .  escitalopram (LEXAPRO) 10 MG tablet, Take 1 tablet (10 mg total) by mouth daily., Disp: 90 tablet, Rfl: 0 .  ibuprofen (ADVIL,MOTRIN) 200 MG tablet, Take 200 mg by mouth every 6 (six) hours as needed., Disp: , Rfl:  .  losartan (COZAAR) 50 MG tablet, Take 1.5 tablets (75 mg total) by mouth daily., Disp: 135 tablet, Rfl: 0 .  pantoprazole (PROTONIX) 40 MG tablet, Take 1 tablet (40 mg total) by mouth daily., Disp: 90 tablet, Rfl: 0 .  sildenafil (VIAGRA) 50 MG tablet, Take 1 tablet (50 mg total) by mouth  daily as needed for erectile dysfunction., Disp: 6 tablet, Rfl: 0  No Known Allergies   Review of Systems  Constitutional: Negative for chills, fever and malaise/fatigue.  HENT: Negative for congestion and sore throat.   Eyes: Negative for blurred vision and double vision.  Respiratory: Negative for cough, sputum production and shortness of breath.   Cardiovascular: Negative for chest pain and leg swelling.  Gastrointestinal: Negative for abdominal pain, blood in stool and diarrhea.  Genitourinary: Negative for dysuria and hematuria.  Musculoskeletal: Positive for neck pain. Negative for back pain and joint pain.  Neurological: Positive for headaches (occasional headaches). Negative for dizziness.  Psychiatric/Behavioral: Negative for depression. The patient is nervous/anxious. The patient does not have insomnia.     Objective  Vitals:   10/15/16 1013  BP: 128/80  Pulse: 76  Resp: 16  Temp: 98 F (36.7 C)  TempSrc: Oral  SpO2: 97%  Weight: 214 lb 12.8 oz (97.4 kg)  Height: 5\' 11"  (1.803 m)    Physical Exam  Constitutional: He is oriented to person, place, and time and well-developed, well-nourished, and in no distress.  HENT:  Head: Normocephalic and atraumatic.  Right Ear:  Tympanic membrane and ear canal normal.  Left Ear: Tympanic membrane and ear canal normal.  Mouth/Throat: No posterior oropharyngeal erythema.  Eyes: Pupils are equal, round, and reactive to light.  Cardiovascular: Normal rate, regular rhythm and normal heart sounds.   No murmur heard. Pulmonary/Chest: Effort normal and breath sounds normal. He has no wheezes.  Abdominal: Soft. Bowel sounds are normal. There is no tenderness.  Genitourinary:  Genitourinary Comments: deferred  Musculoskeletal:       Cervical back: He exhibits no tenderness.       Lumbar back: He exhibits no tenderness.  Neurological: He is alert and oriented to person, place, and time.  Psychiatric: Mood, memory, affect and  judgment normal.  Nursing note and vitals reviewed.     Assessment & Plan  1. Annual physical exam Obtain age-appropriate screening lab work. - CBC with Differential - COMPLETE METABOLIC PANEL WITH GFR - TSH - Vitamin D (25 hydroxy)   Rockwell Zentz [facility] 10/15/2016 10:20 AM

## 2016-10-16 LAB — VITAMIN D 25 HYDROXY (VIT D DEFICIENCY, FRACTURES): VIT D 25 HYDROXY: 20 ng/mL — AB (ref 30–100)

## 2016-10-18 ENCOUNTER — Other Ambulatory Visit: Payer: Self-pay | Admitting: Emergency Medicine

## 2016-10-18 MED ORDER — VITAMIN D (ERGOCALCIFEROL) 1.25 MG (50000 UNIT) PO CAPS: 50000.0000 [IU] | ORAL_CAPSULE | ORAL | 0 refills | Status: DC

## 2016-12-28 ENCOUNTER — Ambulatory Visit: Payer: Managed Care, Other (non HMO) | Admitting: Family Medicine

## 2017-02-18 ENCOUNTER — Encounter: Payer: Self-pay | Admitting: Family Medicine

## 2017-02-18 ENCOUNTER — Ambulatory Visit (INDEPENDENT_AMBULATORY_CARE_PROVIDER_SITE_OTHER): Payer: Managed Care, Other (non HMO) | Admitting: Family Medicine

## 2017-02-18 VITALS — BP 132/82 | HR 114 | Temp 97.8°F | Resp 16 | Ht 71.0 in | Wt 228.1 lb

## 2017-02-18 DIAGNOSIS — E782 Mixed hyperlipidemia: Secondary | ICD-10-CM | POA: Diagnosis not present

## 2017-02-18 DIAGNOSIS — K219 Gastro-esophageal reflux disease without esophagitis: Secondary | ICD-10-CM | POA: Diagnosis not present

## 2017-02-18 DIAGNOSIS — Z1839 Other retained organic fragments: Secondary | ICD-10-CM

## 2017-02-18 DIAGNOSIS — S70351A Superficial foreign body, right thigh, initial encounter: Secondary | ICD-10-CM

## 2017-02-18 DIAGNOSIS — I10 Essential (primary) hypertension: Secondary | ICD-10-CM

## 2017-02-18 MED ORDER — PANTOPRAZOLE SODIUM 40 MG PO TBEC: 40.0000 mg | DELAYED_RELEASE_TABLET | Freq: Every day | ORAL | 2 refills | Status: DC

## 2017-02-18 MED ORDER — PANTOPRAZOLE SODIUM 40 MG PO TBEC: 40.0000 mg | DELAYED_RELEASE_TABLET | Freq: Every day | ORAL | 0 refills | Status: DC

## 2017-02-18 MED ORDER — DOXYCYCLINE HYCLATE 100 MG PO CAPS: 200.0000 mg | ORAL_CAPSULE | Freq: Once | ORAL | 0 refills | Status: AC

## 2017-02-18 MED ORDER — LOSARTAN POTASSIUM 50 MG PO TABS: 75.0000 mg | ORAL_TABLET | Freq: Every day | ORAL | 0 refills | Status: DC

## 2017-02-18 MED ORDER — LOSARTAN POTASSIUM 50 MG PO TABS: 75.0000 mg | ORAL_TABLET | Freq: Every day | ORAL | 2 refills | Status: DC

## 2017-02-18 MED ORDER — ATORVASTATIN CALCIUM 20 MG PO TABS: 20.0000 mg | ORAL_TABLET | Freq: Every day | ORAL | 0 refills | Status: DC

## 2017-02-18 MED ORDER — ATORVASTATIN CALCIUM 20 MG PO TABS: 20.0000 mg | ORAL_TABLET | Freq: Every day | ORAL | 2 refills | Status: DC

## 2017-02-18 NOTE — Progress Notes (Addendum)
Name: David Carlson   MRN: [id number]    DOB: April 09, 1971   Date:02/18/2017       Progress Note  Subjective  Chief Complaint  Chief Complaint  Patient presents with  . Tick Removal    rt inner upper thigh pt tried to remove it himself he's not sure if he got it out          . Medication Refill    HPI  PT presents with 2 day history of embedded tick in right medial thigh. Endorses itching and redness at site but no rash otherwise.  Endorses mild headache x2 days but is unsure if this is his normal headache or not. No myalgias/arthralgias, no fevers/chills/night sweats.  Hyperlipidemia: Last lipid panel was 06/2016 and LDL was at goal, but triglycerides were 363 and HSL was 36.  Tolerating Lipitor well no body aches/myalgias, chest pain or shortness of breath. He drinks "a few beers" after work each night and on the weekends, eats greasy foods, and does not exercise. He is not motivated to make lifestyle modifications at this time.   HTN: Has been taking medication as prescribed. BP is at goal today, he does not check it at home and does not follow a specific diet though he does note he eats some vegetables now.  No chest pain, shortness of breath, edema or vision changes.   GERD: Ongoing issue, has been taking Protonix once daily. Has intermittent abdominal pain that he describes as burning. He reports no chest pain, no choking, no coughing or nausea.  He has tried an antacid and PPI's (Has taken Rantitidine and Prilosec in the past, which did not work) for the symptoms. Needs refill of protonix and would like referral to GI for further evaluation and possible endoscopy.    Discussed his lifestyle and diet at length and patient is not ready to make drastic changes at this time despite his multiple health issues related to this. He is made aware that we are here to help him when he is ready.  Patient Active Problem List   Diagnosis Date Noted  . Annual physical exam 10/15/2016  .  Vitamin D deficiency 01/13/2016  . Erectile dysfunction 01/13/2016  . Encounter for annual health examination 10/01/2015  . Fatigue 10/01/2015  . Left inguinal pain 07/10/2015  . GERD (gastroesophageal reflux disease) 03/14/2015  . Anxiety disorder 03/14/2015  . Hypercholesterolemia with hypertriglyceridemia 03/14/2015  . BP (high blood pressure) 03/14/2015  . Dermatitis, eczematoid 03/14/2015  . Headache, tension-type 03/14/2015  . Cervical pain 03/14/2015    Social History  Substance Use Topics  . Smoking status: Former Smoker    Packs/day: 1.00    Types: Cigarettes    Quit date: 09/15/2015  . Smokeless tobacco: Never Used  . Alcohol use 0.0 oz/week     Comment: occas    Current Outpatient Prescriptions:  .  atorvastatin (LIPITOR) 20 MG tablet, Take 1 tablet (20 mg total) by mouth daily at 6 PM., Disp: 90 tablet, Rfl: 0 .  clonazePAM (KLONOPIN) 0.5 MG tablet, Take 1 tablet (0.5 mg total) by mouth 3 (three) times daily as needed for anxiety., Disp: 90 tablet, Rfl: 2 .  escitalopram (LEXAPRO) 10 MG tablet, Take 1 tablet (10 mg total) by mouth daily., Disp: 90 tablet, Rfl: 0 .  ibuprofen (ADVIL,MOTRIN) 200 MG tablet, Take 200 mg by mouth every 6 (six) hours as needed., Disp: , Rfl:  .  losartan (COZAAR) 50 MG tablet, Take 1.5 tablets (75 mg  total) by mouth daily., Disp: 135 tablet, Rfl: 0 .  pantoprazole (PROTONIX) 40 MG tablet, Take 1 tablet (40 mg total) by mouth daily., Disp: 90 tablet, Rfl: 0 .  sildenafil (VIAGRA) 50 MG tablet, Take 1 tablet (50 mg total) by mouth daily as needed for erectile dysfunction., Disp: 6 tablet, Rfl: 0 .  Vitamin D, Ergocalciferol, (DRISDOL) 50000 units CAPS capsule, Take 1 capsule (50,000 Units total) by mouth every 7 (seven) days., Disp: 12 capsule, Rfl: 0  No Known Allergies  ROS  Ten systems reviewed and is negative except as mentioned in HPI  Objective  Vitals:   02/18/17 1407  BP: 132/82  Pulse: (!) 114  Resp: 16  Temp: 97.8 F (36.6  C)  SpO2: 95%  Weight: 228 lb 1 oz (103.4 kg)  Height: 5\' 11"  (1.803 m)    Body mass index is 31.81 kg/m.  Nursing Note and Vital Signs reviewed.  Physical Exam  Constitutional: Patient appears well-developed and well-nourished. Obese No distress.  HEENT: head atraumatic, normocephalic Cardiovascular: Normal rate, regular rhythm, S1/S2 present.  No murmur or rub heard. No BLE edema. Pulmonary/Chest: Effort normal and breath sounds clear. No respiratory distress or retractions. Skin: 2cm diameter erythema with black 0.25cm tick at center that is embedded in skin, unable to remove with tweezers - see procedure note below. Psychiatric: Patient has a normal mood and affect. behavior is normal. Judgment and thought content normal.  No results found for this or any previous visit (from the past 2160 hour(s)).   Assessment & Plan 1. Embedded tick of right thigh, initial encounter  - doxycycline (VIBRAMYCIN) 100 MG capsule; Take 2 capsules (200 mg total) by mouth once.  Dispense: 2 capsule; Refill: 0 - Advised prophylactic Doxycycline per guidelines due to bite being within less than 72 hours. - Procedure: embedded tick removal Verbal consent obtained from patient 1% Lidocaine injected to site #11 blade used to excise tick, minimal bleeding, steri-strip applied to site. Patient tolerated procedure well No side effects Pt will keep area clean and dry for the next 24 hours. Discussed signs and symptoms of infection and pt verbalized understanding.  2. Hypercholesterolemia with hypertriglyceridemia - atorvastatin (LIPITOR) 20 MG tablet; Take 1 tablet (20 mg total) by mouth daily at 6 PM.  Dispense: 30 tablet; Refill: 2 - Will recheck Lipids at next visit  3. Essential hypertension - losartan (COZAAR) 50 MG tablet; Take 1.5 tablets (75 mg total) by mouth daily.  Dispense: 45 tablet; Refill: 2 - Will check CMP at next visit.  4. Gastroesophageal reflux disease, esophagitis presence  not specified - pantoprazole (PROTONIX) 40 MG tablet; Take 1 tablet (40 mg total) by mouth daily.  Dispense: 30 tablet; Refill: 2  Significant teaching provided to patient regarding lifestyle changes to help GERD, Hyperlipidemia, and HTN.  -Red flags and when to present for emergency care or RTC including fever >101.52F, chest pain, shortness of breath, new/worsening/un-resolving symptoms, night sweats, arthralgias, myalgias, or rash, increased redness at site of bite reviewed with patient at time of visit. Follow up and care instructions discussed and provided in AVS.  I have reviewed this encounter including the documentation in this note and/or discussed this patient with the Johney Maine, FNP, NP-C. I am certifying that I agree with the content of this note as supervising physician.  Steele Sizer, MD [facility] 02/20/2017, 4:36 PM

## 2017-02-18 NOTE — Patient Instructions (Addendum)
Tick Bite Information Introduction Ticks are insects that attach themselves to the skin. There are many types of ticks. Common types include wood ticks and deer ticks. Sometimes, ticks carry diseases that can make a person very ill. The most common places for ticks to attach themselves are the scalp, neck, armpits, waist, and groin. HOW CAN YOU PREVENT TICK BITES? Take these steps to help prevent tick bites when you are outdoors:  Wear long sleeves and long pants.  Wear white clothes so you can see ticks more easily.  Tuck your pant legs into your socks.  If walking on a trail, stay in the middle of the trail to avoid brushing against bushes.  Avoid walking through areas with long grass.  Put bug spray on all skin that is showing and along boot tops, pant legs, and sleeve cuffs.  Check clothes, hair, and skin often and before going inside.  Brush off any ticks that are not attached.  Take a shower or bath as soon as possible after being outdoors.  HOW SHOULD YOU REMOVE A TICK? Ticks should be removed as soon as possible to help prevent diseases. 1. If latex gloves are available, put them on before trying to remove a tick. 2. Use tweezers to grasp the tick as close to the skin as possible. You may also use curved forceps or a tick removal tool. Grasp the tick as close to its head as possible. Avoid grasping the tick on its body. 3. Pull gently upward until the tick lets go. Do not twist the tick or jerk it suddenly. This may break off the tick's head or mouth parts. 4. Do not squeeze or crush the tick's body. This could force disease-carrying fluids from the tick into your body. 5. After the tick is removed, wash the bite area and your hands with soap and water or alcohol. 6. Apply a small amount of antiseptic cream or ointment to the bite site. 7. Wash any tools that were used.  Do not try to remove a tick by applying a hot match, petroleum jelly, or fingernail polish to the tick.  These methods do not work. They may also increase the chances of disease being spread from the tick bite. WHEN SHOULD YOU SEEK HELP? Contact your health care provider if you are unable to remove a tick or if a part of the tick breaks off in the skin. After a tick bite, you need to watch for signs and symptoms of diseases that can be spread by ticks. Contact your health care provider if you develop any of the following:  Fever.  Rash.  Redness and puffiness (swelling) in the area of the tick bite.  Tender, puffy lymph glands.  Watery poop (diarrhea).  Weight loss.  Cough.  Feeling more tired than normal (fatigue).  Muscle, joint, or bone pain.  Belly (abdominal) pain.  Headache.  Change in your level of consciousness.  Trouble walking or moving your legs.  Loss of feeling (numbness) in the legs.  Loss of movement (paralysis).  Shortness of breath.  Confusion.  Throwing up (vomiting) many times.  This information is not intended to replace advice given to you by your health care provider. Make sure you discuss any questions you have with your health care provider. Document Released: 11/24/2009 Document Revised: 02/05/2016 Document Reviewed: 02/07/2013 Elsevier Interactive Patient Education  2018 Elsevier Inc.  

## 2017-03-23 ENCOUNTER — Ambulatory Visit (INDEPENDENT_AMBULATORY_CARE_PROVIDER_SITE_OTHER): Payer: Managed Care, Other (non HMO) | Admitting: Gastroenterology

## 2017-03-23 ENCOUNTER — Telehealth: Payer: Self-pay

## 2017-03-23 ENCOUNTER — Encounter: Payer: Self-pay | Admitting: Gastroenterology

## 2017-03-23 ENCOUNTER — Other Ambulatory Visit: Payer: Self-pay

## 2017-03-23 VITALS — BP 137/90 | HR 111 | Temp 98.1°F | Ht 71.0 in | Wt 223.8 lb

## 2017-03-23 DIAGNOSIS — K219 Gastro-esophageal reflux disease without esophagitis: Secondary | ICD-10-CM | POA: Diagnosis not present

## 2017-03-23 DIAGNOSIS — R002 Palpitations: Secondary | ICD-10-CM

## 2017-03-23 NOTE — Telephone Encounter (Signed)
Patient has been informed of his appt to see NP Virgilio Belling at [facility] on 04/25/17 @ 2pm.  He informed me he also has an appt to see Dr. Manuella Ghazi his primary tomorrow @1 :30.  I've asked for them to perform EKG being that his appt is later.

## 2017-03-23 NOTE — Progress Notes (Signed)
Jonathon Bellows MD, MRCP(U.K) [address]  [place]  [place], [place] [address]  Main: [phone]  Fax: [phone]   Gastroenterology Consultation  Referring Provider:     Hubbard Hartshorn, FNP Primary Care Physician:  Roselee Nova, MD Primary Gastroenterologist:  Dr. Jonathon Bellows  Reason for Consultation:     GERD        HPI:   David Carlson is a 46 y.o. y/o male referred for consultation & management  by Dr. Manuella Ghazi, [place]bert [place], MD.     He has been referred for uncontrolled GERD with need for possible endoscopy .   Reflux:  Onset : > 20 years back , has symptoms when he does take his prilosec.  Symptoms: abdominal discomfort with heartburn , without medication has a sour sensation in his mouth . He says since he has had issues so many years decided to see GI  Recent weight gain: no  Medications: Protonix 40 daily with no symptoms - afternoon before his first meal  Narcotics or anticholinergics use : no  PPI /H2 blockers or Antacid  use and timing :as above  Dinner time : at 6-7 pm , goes to bed at around 2-3 am , in between is working   Prior EGD: no  Family history of esophageal cancer:no  Ex smoker 2 years and smoked for 20 years.     Past Medical History:  Diagnosis Date  . Hyperlipidemia   . Hypertension     Past Surgical History:  Procedure Laterality Date  . HERNIA REPAIR      Prior to Admission medications   Medication Sig Start Date End Date Taking? Authorizing Provider  atorvastatin (LIPITOR) 20 MG tablet Take 1 tablet (20 mg total) by mouth daily at 6 PM. 02/18/17   Hubbard Hartshorn, FNP  clonazePAM (KLONOPIN) 0.5 MG tablet Take 1 tablet (0.5 mg total) by mouth 3 (three) times daily as needed for anxiety. 09/10/16   Roselee Nova, MD  escitalopram (LEXAPRO) 10 MG tablet Take 1 tablet (10 mg total) by mouth daily. 09/27/16   Roselee Nova, MD  ibuprofen (ADVIL,MOTRIN) 200 MG tablet Take 200 mg by mouth every 6 (six) hours as needed.     [provider]  losartan (COZAAR) 50 MG tablet Take 1.5 tablets (75 mg total) by mouth daily. 02/18/17   Hubbard Hartshorn, FNP  pantoprazole (PROTONIX) 40 MG tablet Take 1 tablet (40 mg total) by mouth daily. 02/18/17   Hubbard Hartshorn, FNP  sildenafil (VIAGRA) 50 MG tablet Take 1 tablet (50 mg total) by mouth daily as needed for erectile dysfunction. 06/15/16   Roselee Nova, MD  Vitamin D, Ergocalciferol, (DRISDOL) 50000 units CAPS capsule Take 1 capsule (50,000 Units total) by mouth every 7 (seven) days. 10/18/16   Roselee Nova, MD    Family History  Problem Relation Age of Onset  . Diabetes Mother   . Hypertension Father   . Heart disease Maternal Grandmother   . Diabetes Maternal Grandfather      Social History  Substance Use Topics  . Smoking status: Former Smoker    Packs/day: 1.00    Types: Cigarettes    Quit date: 09/15/2015  . Smokeless tobacco: Never Used  . Alcohol use 0.0 oz/week     Comment: occas    Allergies as of 03/23/2017  . (No Known Allergies)   BP (!) 142/96   Pulse Marland Kitchen)  40   Temp 98.1 F (36.7 C) (Oral)   Ht 5\' 11"  (1.803 m)   Wt 223 lb 12.8 oz (101.5 kg)   BMI 31.21 kg/m   Review of Systems:    All systems reviewed and negative except where noted in HPI.   Physical Exam:  There were no vitals taken for this visit. No LMP for male patient. Psych:  Alert and cooperative. Normal mood and affect. General:   Alert,  Well-developed, well-nourished, pleasant and cooperative in NAD Head:  Normocephalic and atraumatic. Eyes:  Sclera clear, no icterus.   Conjunctiva pink. Ears:  Normal auditory acuity. Nose:  No deformity, discharge, or lesions. Mouth:  No deformity or lesions,oropharynx pink & moist. Neck:  Supple; no masses or thyromegaly. Lungs:  Respirations even and unlabored.  Clear throughout to auscultation.   No wheezes, crackles, or rhonchi. No acute distress. Heart:  Regular rate and rhythm; no murmurs, clicks, rubs, or  gallops. Abdomen:  Normal bowel sounds.  No bruits.  Soft, non-tender and non-distended without masses, hepatosplenomegaly or hernias noted.  No guarding or rebound tenderness.    Msk:  Symmetrical without gross deformities. Good, equal movement & strength bilaterally. Pulses:  Normal pulses noted. Extremities:  No clubbing or edema.  No cyanosis. Neurologic:  Alert and oriented x3;  grossly normal neurologically. Skin:  Intact without significant lesions or rashes. No jaundice. Lymph Nodes:  No significant cervical adenopathy. Psych:  Alert and cooperative. Normal mood and affect.  Imaging Studies: No results found.  Assessment and Plan:   David Carlson is a 46 y.o. y/o male has been referred for GERD. Counseled on life style changes, suggest to use PPI first thing in the morning on empty stomach and eat 30 minutes after. Advised on the use of a wedge pillow at night , avoid meals for 2 hours prior to bed time. Weight loss Discussed the risks and benefits of long term PPI use including but not limited to bone loss, chronic kidney disease, infections , low magnesium . Aim to use at the lowest dose for the shortest period of time Discussed the rationale for an endoscopy to screen for [facility]  and decided to proceed .  Noted low pulse initially - rechecked and was normal . He has issues with palpitations for many years, advised to speak to his PCP regarding further evaluation and possible holter monitor. Suggest cardiac evaluation prior to EGD  Dr Jonathon Bellows MD,MRCP(U.K)

## 2017-03-24 ENCOUNTER — Encounter: Payer: Self-pay | Admitting: Family Medicine

## 2017-03-24 ENCOUNTER — Ambulatory Visit (INDEPENDENT_AMBULATORY_CARE_PROVIDER_SITE_OTHER): Payer: Managed Care, Other (non HMO) | Admitting: Family Medicine

## 2017-03-24 VITALS — BP 132/78 | HR 69 | Temp 98.1°F | Resp 16 | Ht 71.0 in | Wt 222.6 lb

## 2017-03-24 DIAGNOSIS — F411 Generalized anxiety disorder: Secondary | ICD-10-CM

## 2017-03-24 DIAGNOSIS — I499 Cardiac arrhythmia, unspecified: Secondary | ICD-10-CM

## 2017-03-24 DIAGNOSIS — R002 Palpitations: Secondary | ICD-10-CM

## 2017-03-24 DIAGNOSIS — E669 Obesity, unspecified: Secondary | ICD-10-CM | POA: Diagnosis not present

## 2017-03-24 DIAGNOSIS — R001 Bradycardia, unspecified: Secondary | ICD-10-CM

## 2017-03-24 MED ORDER — CLONAZEPAM 0.5 MG PO TABS
0.5000 mg | ORAL_TABLET | Freq: Three times a day (TID) | ORAL | 2 refills | Status: DC | PRN
Start: 2017-03-24 — End: 2017-09-21

## 2017-03-24 NOTE — Progress Notes (Signed)
Name: David Carlson   MRN: [id number]    DOB: 04-May-1971   Date:03/24/2017       Progress Note  Subjective  Chief Complaint  Chief Complaint  Patient presents with  . Abnormal ECG    HPI  Pt. is here for follow up after being seen by Gastroenterology for an abnormally low pulse and concern about palpitations. His heart rate was reportedly in the 40 s initially, came back up to over 100 s after recheck. He has a long history of palpitations which come and go, his blood pressure and heart rate are both normal today.  He feels well, no concerns about chest pain, has chronic headaches.    Past Medical History:  Diagnosis Date  . Hyperlipidemia   . Hypertension     Past Surgical History:  Procedure Laterality Date  . HERNIA REPAIR      Family History  Problem Relation Age of Onset  . Diabetes Mother   . Hypertension Father   . Heart disease Maternal Grandmother   . Diabetes Maternal Grandfather     Social History   Social History  . Marital status: Single    Spouse name: N/A  . Number of children: N/A  . Years of education: N/A   Occupational History  . Not on file.   Social History Main Topics  . Smoking status: Former Smoker    Packs/day: 1.00    Types: Cigarettes    Quit date: 09/15/2015  . Smokeless tobacco: Never Used  . Alcohol use 0.0 oz/week     Comment: occas  . Drug use: No  . Sexual activity: Not on file   Other Topics Concern  . Not on file   Social History Narrative  . No narrative on file     Current Outpatient Prescriptions:  .  atorvastatin (LIPITOR) 20 MG tablet, Take 1 tablet (20 mg total) by mouth daily at 6 PM., Disp: 30 tablet, Rfl: 2 .  clonazePAM (KLONOPIN) 0.5 MG tablet, Take 1 tablet (0.5 mg total) by mouth 3 (three) times daily as needed for anxiety., Disp: 90 tablet, Rfl: 2 .  escitalopram (LEXAPRO) 10 MG tablet, Take 1 tablet (10 mg total) by mouth daily., Disp: 90 tablet, Rfl: 0 .  ibuprofen (ADVIL,MOTRIN) 200 MG  tablet, Take 200 mg by mouth every 6 (six) hours as needed., Disp: , Rfl:  .  losartan (COZAAR) 50 MG tablet, Take 1.5 tablets (75 mg total) by mouth daily., Disp: 45 tablet, Rfl: 2 .  pantoprazole (PROTONIX) 40 MG tablet, Take 1 tablet (40 mg total) by mouth daily., Disp: 30 tablet, Rfl: 2 .  sildenafil (VIAGRA) 50 MG tablet, Take 1 tablet (50 mg total) by mouth daily as needed for erectile dysfunction., Disp: 6 tablet, Rfl: 0 .  Vitamin D, Ergocalciferol, (DRISDOL) 50000 units CAPS capsule, Take 1 capsule (50,000 Units total) by mouth every 7 (seven) days. (Patient not taking: Reported on 03/23/2017), Disp: 12 capsule, Rfl: 0  No Known Allergies   ROS  Please see history of present illness for complete description of ROS  Objective  Vitals:   03/24/17 1323  BP: 132/78  Pulse: 69  Resp: 16  Temp: 98.1 F (36.7 C)  TempSrc: Oral  SpO2: 96%  Weight: 222 lb 9.6 oz (101 kg)  Height: 5\' 11"  (1.803 m)    Physical Exam  Constitutional: He is oriented to person, place, and time and well-developed, well-nourished, and in no distress.  HENT:  Head: Normocephalic and  atraumatic.  Cardiovascular: Normal rate, regular rhythm and normal heart sounds.   No murmur heard. Pulmonary/Chest: Effort normal and breath sounds normal. No respiratory distress. He has no wheezes.  Neurological: He is alert and oriented to person, place, and time.  Skin: Skin is warm and dry.  Psychiatric: Mood, memory, affect and judgment normal.  Nursing note and vitals reviewed.      Assessment & Plan  1. Generalized anxiety disorder Stable, responsive to clonazepam taken up to 3 times daily when needed, refills provided - clonazePAM (KLONOPIN) 0.5 MG tablet; Take 1 tablet (0.5 mg total) by mouth 3 (three) times daily as needed for anxiety.  Dispense: 90 tablet; Refill: 2  2. Palpitations Patient has been referred to cardiology.  3. Pulse slow Pulse rate is normal in her office today, reassured and will  follow-up with cardiology  4. Obesity (BMI 30-39.9) Has tried keto diet and lost 10 lbs, then had to stop and now thinking of going back on keto diet.     [name] [facility] 03/24/2017 1:33 PM

## 2017-04-25 ENCOUNTER — Ambulatory Visit (INDEPENDENT_AMBULATORY_CARE_PROVIDER_SITE_OTHER): Payer: Managed Care, Other (non HMO) | Admitting: Nurse Practitioner

## 2017-04-25 ENCOUNTER — Encounter: Payer: Self-pay | Admitting: Nurse Practitioner

## 2017-04-25 VITALS — BP 128/87 | HR 103 | Ht 71.0 in | Wt 220.2 lb

## 2017-04-25 DIAGNOSIS — E782 Mixed hyperlipidemia: Secondary | ICD-10-CM | POA: Diagnosis not present

## 2017-04-25 DIAGNOSIS — I10 Essential (primary) hypertension: Secondary | ICD-10-CM

## 2017-04-25 DIAGNOSIS — I493 Ventricular premature depolarization: Secondary | ICD-10-CM | POA: Diagnosis not present

## 2017-04-25 DIAGNOSIS — R002 Palpitations: Secondary | ICD-10-CM

## 2017-04-25 NOTE — Patient Instructions (Signed)
Medication Instructions:  Your physician recommends that you continue on your current medications as directed. Please refer to the Current Medication list given to you today.   Labwork: none  Testing/Procedures: Your physician has recommended that you wear a 48 HOUR holter monitor. Holter monitors are medical devices that record the heart's electrical activity. Doctors most often use these monitors to diagnose arrhythmias. Arrhythmias are problems with the speed or rhythm of the heartbeat. The monitor is a small, portable device. You can wear one while you do your normal daily activities. This is usually used to diagnose what is causing palpitations/syncope (passing out).   Your physician has requested that you have an echocardiogram. Echocardiography is a painless test that uses sound waves to create images of your heart. It provides your doctor with information about the size and shape of your heart and how well your heart's chambers and valves are working. This procedure takes approximately one hour. There are no restrictions for this procedure.    Follow-Up: Your physician recommends that you schedule a follow-up appointment in: [place].   If you need a refill on your cardiac medications before your next appointment, please call your pharmacy.    Echocardiogram An echocardiogram, or echocardiography, uses sound waves (ultrasound) to produce an image of your heart. The echocardiogram is simple, painless, obtained within a short period of time, and offers valuable information to your health care provider. The images from an echocardiogram can provide information such as:  Evidence of coronary artery disease (CAD).  Heart size.  Heart muscle function.  Heart valve function.  Aneurysm detection.  Evidence of a past heart attack.  Fluid buildup around the heart.  Heart muscle thickening.  Assess heart valve function.  Tell a health care provider  about:  Any allergies you have.  All medicines you are taking, including vitamins, herbs, eye drops, creams, and over-the-counter medicines.  Any problems you or family members have had with anesthetic medicines.  Any blood disorders you have.  Any surgeries you have had.  Any medical conditions you have.  Whether you are pregnant or may be pregnant. What happens before the procedure? No special preparation is needed. Eat and drink normally. What happens during the procedure?  In order to produce an image of your heart, gel will be applied to your chest and a wand-like tool (transducer) will be moved over your chest. The gel will help transmit the sound waves from the transducer. The sound waves will harmlessly bounce off your heart to allow the heart images to be captured in real-time motion. These images will then be recorded.  You may need an IV to receive a medicine that improves the quality of the pictures. What happens after the procedure? You may return to your normal schedule including diet, activities, and medicines, unless your health care provider tells you otherwise. This information is not intended to replace advice given to you by your health care provider. Make sure you discuss any questions you have with your health care provider. Document Released: 08/27/2000 Document Revised: 04/17/2016 Document Reviewed: 05/07/2013 Elsevier Interactive Patient Education  2017 [employer].     Holter Monitoring A Holter monitor is a small device that is used to detect abnormal heart rhythms. It clips to your clothing and is connected by wires to flat, sticky disks (electrodes) that attach to your chest. It is worn continuously for 24-48 hours. Follow these instructions at home:  Wear your Holter monitor at all times, even  while exercising and sleeping, for as long as directed by your health care provider.  Make sure that the Holter monitor is safely clipped to your clothing or  close to your body as recommended by your health care provider.  Do not get the monitor or wires wet.  Do not put body lotion or moisturizer on your chest.  Keep your skin clean.  Keep a diary of your daily activities, such as walking and doing chores. If you feel that your heartbeat is abnormal or that your heart is fluttering or skipping a beat: ? Record what you are doing when it happens. ? Record what time of day the symptoms occur.  Return your Holter monitor as directed by your health care provider.  Keep all follow-up visits as directed by your health care provider. This is important. Get help right away if:  You feel lightheaded or you faint.  You have trouble breathing.  You feel pain in your chest, upper arm, or jaw.  You feel sick to your stomach and your skin is pale, cool, or damp.  You heartbeat feels unusual or abnormal. This information is not intended to replace advice given to you by your health care provider. Make sure you discuss any questions you have with your health care provider. Document Released: 05/28/2004 Document Revised: 02/05/2016 Document Reviewed: 04/08/2014 Elsevier Interactive Patient Education  [employer].

## 2017-04-25 NOTE — Progress Notes (Signed)
Cardiology Clinic Note   Patient Name: David Carlson Date of Encounter: 04/25/2017  Primary Care Provider:  Roselee Nova, MD Primary Cardiologist:  New  Patient Profile    46 y/o ? with a h/o palpitations, HTN, HL, obesity, GERD, anxiety, and ED, who is being evaluated today due to palpitations and PVCs.  Past Medical History    Past Medical History:  Diagnosis Date  . Anxiety   . Erectile dysfunction   . GERD (gastroesophageal reflux disease)   . Hyperlipidemia   . Hypertension   . Obesity   . Palpitations    a. Previously evaluted when he was incarcerated in [place] - reportedly nl stress test.  . PVC's (premature ventricular contractions)    Past Surgical History:  Procedure Laterality Date  . CARPAL TUNNEL RELEASE Bilateral   . HERNIA REPAIR  2015  . HIP SURGERY     a. Age 62 - failure of growth plate to close - Pins inserted    Allergies  No Known Allergies  History of Present Illness    46 y/o ? with the above PMH including palpitations, HTN, HL, GERD, anxiety, obesity, and erectile dysfxn.  He says that he first noted palpitations @ age 8, when someone slipped him some crack.  Following that, he noted intermittent palpitations over the years.  He was incarcerated in 2008 and was noted to have what I presume to be PVCs based on his description, and he underwent stress testing, which was reportedly normal.  He says that following that, he lost a lot of weight and pretty much stopped noticing palpitations.  He since moved to [facility] and has been told by his PCP in the past that he occasionally has extra heart beats.  He works in [employer] and is reasonably active @ work w/o chest pain, dyspnea, or limitations.  He does not routinely exercise.    He was recently seen by GI for f/u of GERD, which has been stable, and he was noted to be bradycardic with HRs in the 40s when HR was checked using a pulse ox.  He was asymptomatic and BP was nl.  F/u HR later in the  visit was nl.  He f/u with his PCP was referred for evaluation today.  He does have pvc's on ecg.  He is asymptomatic.  He denies chest pain, dyspnea, pnd, orthopnea, n, v, dizziness, syncope, edema, weight gain, or early satiety.    Home Medications    Prior to Admission medications   Medication Sig Start Date End Date Taking? Authorizing Provider  atorvastatin (LIPITOR) 20 MG tablet Take 1 tablet (20 mg total) by mouth daily at 6 PM. 02/18/17  Yes Hubbard Hartshorn, FNP  clonazePAM (KLONOPIN) 0.5 MG tablet Take 1 tablet (0.5 mg total) by mouth 3 (three) times daily as needed for anxiety. 03/24/17  Yes Roselee Nova, MD  ibuprofen (ADVIL,MOTRIN) 200 MG tablet Take 200 mg by mouth every 6 (six) hours as needed.   Yes [provider]  losartan (COZAAR) 50 MG tablet Take 1.5 tablets (75 mg total) by mouth daily. 02/18/17  Yes Hubbard Hartshorn, FNP  pantoprazole (PROTONIX) 40 MG tablet Take 1 tablet (40 mg total) by mouth daily. 02/18/17  Yes Hubbard Hartshorn, FNP    Family History    Family History  Problem Relation Age of Onset  . Diabetes Mother   . Hypertension Father   . Heart disease Maternal Grandmother   .  Heart attack Maternal Grandmother   . Diabetes Maternal Grandfather   . Diabetes Paternal Grandfather     Social History    Social History   Social History  . Marital status: Single    Spouse name: N/A  . Number of children: N/A  . Years of education: N/A   Occupational History  . Not on file.   Social History Main Topics  . Smoking status: Former Smoker    Packs/day: 1.00    Years: 20.00    Types: Cigarettes    Quit date: 09/15/2015  . Smokeless tobacco: Never Used  . Alcohol use 0.0 oz/week     Comment: 3-6 cans/beer on weeknights, 6-12 beers on weekends.  . Drug use: No  . Sexual activity: Not on file   Other Topics Concern  . Not on file   Social History Narrative   Lives in [place] with his ex-girlfriend's brother.  Works @ [employer] in  [employer].  Active @ work w/o limitations but does not routinely exercise.     Review of Systems    General:  No chills, fever, night sweats or weight changes.  Cardiovascular:  No chest pain, dyspnea on exertion, edema, orthopnea, +++ palpitations, no paroxysmal nocturnal dyspnea. Dermatological: No rash, lesions/masses Respiratory: No cough, dyspnea Urologic: No hematuria, dysuria Abdominal:   No nausea, vomiting, diarrhea, bright red blood per rectum, melena, or hematemesis Neurologic:  No visual changes, wkns, changes in mental status. All other systems reviewed and are otherwise negative except as noted above.  Physical Exam    VS:  BP 128/87 (BP Location: Right Arm, Patient Position: Sitting, Cuff Size: Large)   Pulse (!) 103   Ht 5\' 11"  (7.035 m)   Wt 220 lb 4 oz (99.9 kg)   BMI 30.72 kg/m  , BMI Body mass index is 30.72 kg/m. GEN: Well nourished, well developed, in no acute distress.  HEENT: normal.  Neck: Supple, no JVD, carotid bruits, or masses. Cardiac: RRR, no murmurs, rubs, or gallops. No clubbing, cyanosis, edema.  Radials/DP/PT 2+ and equal bilaterally.  Respiratory:  Respirations regular and unlabored, clear to auscultation bilaterally. GI: Soft, nontender, nondistended, BS + x 4. MS: no deformity or atrophy. Skin: warm and dry, no rash. Neuro:  Strength and sensation are intact. Psych: Normal affect.  Accessory Clinical Findings    ECG - sinus tachycardia, 103, pvc's, nonspecific t changes.  BMP Latest Ref Rng & Units 10/15/2016 10/03/2015 03/14/2015  Glucose 65 - 99 mg/dL 79 85 96  BUN 7 - 25 mg/dL 17 14 15   Creatinine 0.60 - 1.35 mg/dL 0.94 0.98 0.94  BUN/Creat Ratio 9 - 20 - 14 16  Sodium 135 - 146 mmol/L 140 141 144  Potassium 3.5 - 5.3 mmol/L 4.6 4.4 4.6  Chloride 98 - 110 mmol/L 106 102 103  CO2 20 - 31 mmol/L 30 24 22   Calcium 8.6 - 10.3 mg/dL 9.1 9.3 9.4   Lab Results  Component Value Date   WBC 4.3 10/15/2016   HGB 14.2 10/15/2016   HCT  41.3 10/15/2016   MCV 90.8 10/15/2016   PLT 237 10/15/2016     Assessment & Plan   1.  Palpitations/PVCs:  Pt with a long h/o palpitations dating back into his late 20's.  He was apparently evaluated for palpitations and possibly pvcs with stress testing while incarcerated in [place] in 2008.  This was reportedly nl.  He was recently evaluated by GI and noted to have bradycardia on pulse ox  monitoring.  He was asymptomatic.  BP nl.  I suspect he was having freq pvcs, which were not perfusing.  ECG today shows freq pvc's.  He is also in sinus tach.  No h/o chest pain or dyspnea.  He tolerates activities well and is reasonably active.  I will place a 48 holter to assess PVC burden and determine whether or not we may need to add  blocker therapy to suppress pvcs.  Further, I will obtain an echo to eval LV fxn and r/o structural abnormalities.  Of note, he had labs in Feb, which showed nl lytes, CBC, and TSH.  Given that this is a chronic issue, I will not repeat labs @ this time.  2.  Essential HTN:  Stable on current regimen.  3.  HL:  Followed by PCP.  On statin.  4.  Dispo:  F/u holter and echo.  F/u in clinic in 3-4 wks.  Murray Hodgkins, NP 04/25/2017, 2:38 PM

## 2017-04-26 ENCOUNTER — Other Ambulatory Visit: Payer: Self-pay

## 2017-04-26 ENCOUNTER — Ambulatory Visit (INDEPENDENT_AMBULATORY_CARE_PROVIDER_SITE_OTHER): Payer: Managed Care, Other (non HMO)

## 2017-04-26 DIAGNOSIS — I493 Ventricular premature depolarization: Secondary | ICD-10-CM

## 2017-04-28 ENCOUNTER — Ambulatory Visit (INDEPENDENT_AMBULATORY_CARE_PROVIDER_SITE_OTHER): Payer: Managed Care, Other (non HMO)

## 2017-04-28 DIAGNOSIS — I493 Ventricular premature depolarization: Secondary | ICD-10-CM

## 2017-05-09 ENCOUNTER — Other Ambulatory Visit: Payer: Self-pay | Admitting: *Deleted

## 2017-05-09 MED ORDER — METOPROLOL TARTRATE 25 MG PO TABS: 25.0000 mg | ORAL_TABLET | Freq: Two times a day (BID) | ORAL | 3 refills | Status: DC

## 2017-05-19 ENCOUNTER — Ambulatory Visit (INDEPENDENT_AMBULATORY_CARE_PROVIDER_SITE_OTHER): Payer: Managed Care, Other (non HMO) | Admitting: Family Medicine

## 2017-05-19 ENCOUNTER — Encounter: Payer: Self-pay | Admitting: Family Medicine

## 2017-05-19 DIAGNOSIS — K219 Gastro-esophageal reflux disease without esophagitis: Secondary | ICD-10-CM | POA: Diagnosis not present

## 2017-05-19 DIAGNOSIS — E782 Mixed hyperlipidemia: Secondary | ICD-10-CM

## 2017-05-19 DIAGNOSIS — I10 Essential (primary) hypertension: Secondary | ICD-10-CM | POA: Diagnosis not present

## 2017-05-19 LAB — LIPID PANEL
CHOL/HDL RATIO: 3.7 (calc) (ref <5.0)
CHOLESTEROL: 160 mg/dL (ref <200)
HDL: 43 mg/dL (ref >40)
NON-HDL CHOLESTEROL (CALC): 117 mg/dL (ref <130)
TRIGLYCERIDES: 430 mg/dL — AB (ref <150)

## 2017-05-19 LAB — COMPLETE METABOLIC PANEL WITH GFR
AG RATIO: 1.9 (calc) (ref 1.0–2.5)
ALT: 47 U/L — AB (ref 9–46)
AST: 29 U/L (ref 10–40)
Albumin: 4.3 g/dL (ref 3.6–5.1)
Alkaline phosphatase (APISO): 52 U/L (ref 40–115)
BILIRUBIN TOTAL: 0.6 mg/dL (ref 0.2–1.2)
BUN: 18 mg/dL (ref 7–25)
CHLORIDE: 105 mmol/L (ref 98–110)
CO2: 27 mmol/L (ref 20–32)
Calcium: 9.1 mg/dL (ref 8.6–10.3)
Creat: 0.9 mg/dL (ref 0.60–1.35)
GFR, EST NON AFRICAN AMERICAN: 103 mL/min/{1.73_m2} (ref >=60)
GFR, Est African American: 119 mL/min/{1.73_m2} (ref >=60)
GLOBULIN: 2.3 g/dL (ref 1.9–3.7)
Glucose, Bld: 91 mg/dL (ref 65–99)
POTASSIUM: 4.1 mmol/L (ref 3.5–5.3)
SODIUM: 142 mmol/L (ref 135–146)
Total Protein: 6.6 g/dL (ref 6.1–8.1)

## 2017-05-19 MED ORDER — LOSARTAN POTASSIUM 50 MG PO TABS: 75.0000 mg | ORAL_TABLET | Freq: Every day | ORAL | 0 refills | Status: DC

## 2017-05-19 MED ORDER — ATORVASTATIN CALCIUM 20 MG PO TABS: 20.0000 mg | ORAL_TABLET | Freq: Every day | ORAL | 0 refills | Status: DC

## 2017-05-19 MED ORDER — PANTOPRAZOLE SODIUM 40 MG PO TBEC: 40.0000 mg | DELAYED_RELEASE_TABLET | Freq: Every day | ORAL | 0 refills | Status: DC

## 2017-05-19 NOTE — Progress Notes (Signed)
Name: David Carlson   MRN: [id number]    DOB: 1971-04-10   Date:05/19/2017       Progress Note  Subjective  Chief Complaint  Chief Complaint  Patient presents with  . Follow-up    3 mo  . Anxiety  . Gastroesophageal Reflux  . Hypertension    Anxiety  Presents for follow-up visit. Patient reports no chest pain, dizziness, excessive worry, nausea, nervous/anxious behavior, palpitations or shortness of breath. The severity of symptoms is moderate and causing significant distress.    Gastroesophageal Reflux  He reports no abdominal pain, no belching, no chest pain, no heartburn or no nausea. This is a chronic problem. He has tried a PPI for the symptoms.  Hypertension  This is a chronic problem. The problem is unchanged. The problem is controlled. Associated symptoms include anxiety. Pertinent negatives include no blurred vision, chest pain, headaches, orthopnea, palpitations or shortness of breath. Past treatments include beta blockers and angiotensin blockers. There is no history of kidney disease, CAD/MI or CVA.  Hyperlipidemia  This is a chronic problem. The problem is controlled. Recent lipid tests were reviewed and are normal. Pertinent negatives include no chest pain, leg pain, myalgias or shortness of breath. Current antihyperlipidemic treatment includes statins.     Past Medical History:  Diagnosis Date  . Anxiety   . Erectile dysfunction   . GERD (gastroesophageal reflux disease)   . Hyperlipidemia   . Hypertension   . Obesity   . Palpitations    a. Previously evaluted when he was incarcerated in [place] - reportedly nl stress test.  . PVC's (premature ventricular contractions)     Past Surgical History:  Procedure Laterality Date  . CARPAL TUNNEL RELEASE Bilateral   . HERNIA REPAIR  2015  . HIP SURGERY     a. Age 35 - failure of growth plate to close - Pins inserted    Family History  Problem Relation Age of Onset  . Diabetes Mother   . Hypertension Father    . Heart disease Maternal Grandmother   . Heart attack Maternal Grandmother   . Diabetes Maternal Grandfather   . Diabetes Paternal Grandfather     Social History   Social History  . Marital status: Single    Spouse name: N/A  . Number of children: N/A  . Years of education: N/A   Occupational History  . Not on file.   Social History Main Topics  . Smoking status: Former Smoker    Packs/day: 1.00    Years: 20.00    Types: Cigarettes    Quit date: 09/15/2015  . Smokeless tobacco: Never Used  . Alcohol use 0.0 oz/week     Comment: 3-6 cans/beer on weeknights, 6-12 beers on weekends.  . Drug use: No  . Sexual activity: Not on file   Other Topics Concern  . Not on file   Social History Narrative   Lives in [place] with his ex-girlfriend's brother.  Works @ [employer] in [employer].  Active @ work w/o limitations but does not routinely exercise.     Current Outpatient Prescriptions:  .  atorvastatin (LIPITOR) 20 MG tablet, Take 1 tablet (20 mg total) by mouth daily at 6 PM., Disp: 30 tablet, Rfl: 2 .  clonazePAM (KLONOPIN) 0.5 MG tablet, Take 1 tablet (0.5 mg total) by mouth 3 (three) times daily as needed for anxiety., Disp: 90 tablet, Rfl: 2 .  losartan (COZAAR) 50 MG tablet, Take 1.5 tablets (75 mg total) by mouth  daily., Disp: 45 tablet, Rfl: 2 .  metoprolol tartrate (LOPRESSOR) 25 MG tablet, Take 1 tablet (25 mg total) by mouth 2 (two) times daily., Disp: 180 tablet, Rfl: 3 .  pantoprazole (PROTONIX) 40 MG tablet, Take 1 tablet (40 mg total) by mouth daily., Disp: 30 tablet, Rfl: 2  No Known Allergies   Review of Systems  Eyes: Negative for blurred vision.  Respiratory: Negative for shortness of breath.   Cardiovascular: Negative for chest pain, palpitations and orthopnea.  Gastrointestinal: Negative for abdominal pain, heartburn and nausea.  Musculoskeletal: Negative for myalgias.  Neurological: Negative for dizziness and headaches.   Psychiatric/Behavioral: The patient is not nervous/anxious.     Objective  Vitals:   05/19/17 0925  BP: 130/67  Pulse: 83  Resp: 16  Temp: (!) 97.4 F (36.3 C)  TempSrc: Oral  SpO2: 96%  Weight: 217 lb 1.6 oz (98.5 kg)  Height: 5\' 11"  (1.803 m)    Physical Exam  Constitutional: He is oriented to person, place, and time and well-developed, well-nourished, and in no distress.  HENT:  Head: Normocephalic and atraumatic.  Cardiovascular: Normal rate, regular rhythm and normal heart sounds.   No murmur heard. Pulmonary/Chest: Effort normal and breath sounds normal. He has no wheezes.  Abdominal: Soft. Bowel sounds are normal. There is no tenderness.  Neurological: He is alert and oriented to person, place, and time.  Psychiatric: Mood, memory, affect and judgment normal.  Nursing note and vitals reviewed.    Assessment & Plan  1. Gastroesophageal reflux disease, esophagitis presence not specified Symptoms stable and responsive to PPI - pantoprazole (PROTONIX) 40 MG tablet; Take 1 tablet (40 mg total) by mouth daily.  Dispense: 90 tablet; Refill: 0  2. Essential hypertension Stable on present anti- hypertensive treatment - losartan (COZAAR) 50 MG tablet; Take 1.5 tablets (75 mg total) by mouth daily.  Dispense: 135 tablet; Refill: 0  3. Hypercholesterolemia with hypertriglyceridemia  - atorvastatin (LIPITOR) 20 MG tablet; Take 1 tablet (20 mg total) by mouth daily at 6 PM.  Dispense: 90 tablet; Refill: 0 - Lipid panel - COMPLETE METABOLIC PANEL WITH GFR   Manjot Beumer [facility] 05/19/2017 9:45 AM

## 2017-06-14 ENCOUNTER — Encounter: Payer: Self-pay | Admitting: Nurse Practitioner

## 2017-06-14 ENCOUNTER — Ambulatory Visit (INDEPENDENT_AMBULATORY_CARE_PROVIDER_SITE_OTHER): Payer: Managed Care, Other (non HMO) | Admitting: Nurse Practitioner

## 2017-06-14 VITALS — BP 130/88 | HR 82 | Ht 71.0 in | Wt 216.5 lb

## 2017-06-14 DIAGNOSIS — I493 Ventricular premature depolarization: Secondary | ICD-10-CM

## 2017-06-14 DIAGNOSIS — I10 Essential (primary) hypertension: Secondary | ICD-10-CM | POA: Diagnosis not present

## 2017-06-14 NOTE — Progress Notes (Signed)
Office Visit    Patient Name: David Carlson Date of Encounter: 06/14/2017  Primary Care Provider:  Roselee Nova, MD Primary Cardiologist:  New - will f/u with Johnny Bridge, MD   Chief Complaint    46 year old male with a history of palpitations, hypertension, hyperlipidemia, obesity, GERD, anxiety, PVCs, and erectile dysfunction, who presents for follow-up.  Past Medical History    Past Medical History:  Diagnosis Date  . Anxiety   . Erectile dysfunction   . GERD (gastroesophageal reflux disease)   . Hyperlipidemia   . Hypertension   . Obesity   . Palpitations    a. Previously evaluted when he was incarcerated in [place] - reportedly nl stress test.  . PVC's (premature ventricular contractions)    a. 04/2013 Echo: EF 50-55%, Gr1 DD, mildly dil LA;  b. 04/2017 48hr holter: freq PVCs (5.4%), rare PACs. Single 13 beat atrial run - 143 bpm.   Past Surgical History:  Procedure Laterality Date  . CARPAL TUNNEL RELEASE Bilateral   . HERNIA REPAIR  2015  . HIP SURGERY     a. Age 49 - failure of growth plate to close - Pins inserted    Allergies  No Known Allergies  History of Present Illness    46 year old male with above past medical history including palpitations, hypertension, hyponatremia, GERD, anxiety, obesity, erectile dysfunction. Palpitations history dates back to age 78. In 2008, he underwent stress testing while incarcerated in [place] and this was reportedly normal. He reports being told he was having PVCs. After losing some weight, he had resolution of palpitations but then more recently, he's been having palpitations. I saw him as a new patient on August 13 after he was reported to be bradycardic when he presented for an EGD. Bradycardia was noted by pulse oximetry. Later on, heart rate was noted to be normal. I suspected that he is having frequent PVCs that were not perfusing. He underwent echocardiography that showed normal LV function and diastolic  dysfunction. 48 hour Holter monitoring showed frequent PVCs Are 5.4% of total beats. Based on that, I placed him on metoprolol therapy. Since then, he says his heart has been feeling more regulated. He has not had any significant fatigue and seems to be tolerating metoprolol well. He notes occasional epigastric discomfort, which he continues to attribute to GERD and is still pending EGD as it was canceled previously. He denies dyspnea, PND, orthopnea, dizziness, syncope, edema, or early satiety.  Home Medications    Prior to Admission medications   Medication Sig Start Date End Date Taking? Authorizing Provider  atorvastatin (LIPITOR) 20 MG tablet Take 1 tablet (20 mg total) by mouth daily at 6 PM. 05/19/17  Yes Roselee Nova, MD  clonazePAM (KLONOPIN) 0.5 MG tablet Take 1 tablet (0.5 mg total) by mouth 3 (three) times daily as needed for anxiety. 03/24/17  Yes Roselee Nova, MD  losartan (COZAAR) 50 MG tablet Take 1.5 tablets (75 mg total) by mouth daily. 05/19/17  Yes Roselee Nova, MD  metoprolol tartrate (LOPRESSOR) 25 MG tablet Take 1 tablet (25 mg total) by mouth 2 (two) times daily. 05/09/17 08/07/17 Yes Rogelia Mire, NP  pantoprazole (PROTONIX) 40 MG tablet Take 1 tablet (40 mg total) by mouth daily. 05/19/17  Yes Roselee Nova, MD    Review of Systems    GERD symptoms.  He denies palpitations, dyspnea, pnd, orthopnea, n, v, dizziness, syncope, edema, weight gain,  or early satiety.  All other systems reviewed and are otherwise negative except as noted above.  Physical Exam    VS:  BP 130/88   Pulse 82   Ht 5\' 11"  (1.803 m)   Wt 216 lb 8 oz (98.2 kg)   BMI 30.20 kg/m  , BMI Body mass index is 30.2 kg/m.   GEN: Well nourished, well developed, in no acute distress.  HEENT: normal.  Neck: Supple, no JVD, carotid bruits, or masses. Cardiac: RRR, no murmurs, rubs, or gallops. No clubbing, cyanosis, edema.  Radials/DP/PT 2+ and equal bilaterally.  Respiratory:   Respirations regular and unlabored, clear to auscultation bilaterally. GI: Soft, nontender, nondistended, BS + x 4. MS: no deformity or atrophy. Skin: warm and dry, no rash. Neuro:  Strength and sensation are intact. Psych: Normal affect.  Accessory Clinical Findings    ECG - Regular sinus rhythm, 82, no acute ST or T changes. Mild prolongation of QTC.  Assessment & Plan    1.  PVCs: Patient reports prior history of palpitations and recently wore a Holter monitor showing PVCs accounting for 5.4% of total beats. Echo showed normal LV function. Since being placed on beta blocker, he has noted better regulation of his heart rhythm. EKG today shows no PVCs which is a major improvement over last time. Continue beta blocker therapy.  2. Essential hypertension: Blood pressure was initially elevated at 140/100. I repeated this was 130/88. He says it is to clean the 120s at home. I have asked him to check his blood pressure daily for the next week and contact us if systolic pressures greater than 0:16 or diastolic greater than 90. Continue beta blocker and ARB.  3. Hyperlipidemia: He is on statin therapy and this is followed by his PCP.  4. GERD: He is pending EGD. Heart rhythm is stable on beta blocker therapy and he may undergo EGD without further cardiac evaluation.  5. Disposition: Follow-up in 3 months.   Murray Hodgkins, NP 06/14/2017, 5:10 PM

## 2017-06-14 NOTE — Patient Instructions (Signed)
Medication Instructions:  Please continue your current medications  Labwork: None  Testing/Procedures: None  Follow-Up: 3 months with Ignacia Bayley, NP  If you need a refill on your cardiac medications before your next appointment, please call your pharmacy.

## 2017-08-18 ENCOUNTER — Ambulatory Visit: Payer: Managed Care, Other (non HMO) | Admitting: Family Medicine

## 2017-09-21 ENCOUNTER — Ambulatory Visit: Payer: Managed Care, Other (non HMO) | Admitting: Family Medicine

## 2017-09-21 ENCOUNTER — Encounter: Payer: Self-pay | Admitting: Family Medicine

## 2017-09-21 DIAGNOSIS — K219 Gastro-esophageal reflux disease without esophagitis: Secondary | ICD-10-CM | POA: Diagnosis not present

## 2017-09-21 DIAGNOSIS — F411 Generalized anxiety disorder: Secondary | ICD-10-CM

## 2017-09-21 DIAGNOSIS — E782 Mixed hyperlipidemia: Secondary | ICD-10-CM | POA: Diagnosis not present

## 2017-09-21 DIAGNOSIS — I10 Essential (primary) hypertension: Secondary | ICD-10-CM

## 2017-09-21 MED ORDER — CLONAZEPAM 0.5 MG PO TABS: 0.5000 mg | ORAL_TABLET | Freq: Three times a day (TID) | ORAL | 2 refills | Status: DC | PRN

## 2017-09-21 MED ORDER — ATORVASTATIN CALCIUM 20 MG PO TABS: 20.0000 mg | ORAL_TABLET | Freq: Every day | ORAL | 0 refills | Status: DC

## 2017-09-21 MED ORDER — LOSARTAN POTASSIUM 50 MG PO TABS: 75.0000 mg | ORAL_TABLET | Freq: Every day | ORAL | 0 refills | Status: DC

## 2017-09-21 MED ORDER — PANTOPRAZOLE SODIUM 40 MG PO TBEC: 40.0000 mg | DELAYED_RELEASE_TABLET | Freq: Every day | ORAL | 0 refills | Status: DC

## 2017-09-21 NOTE — Progress Notes (Signed)
Name: David Carlson   MRN: [id number]    DOB: 12-12-1970   Date:09/21/2017       Progress Note  Subjective  Chief Complaint  Chief Complaint  Patient presents with  . Follow-up    3 month  . Medication Refill    Clonazepam    Hyperlipidemia  This is a chronic problem. The problem is uncontrolled. Recent lipid tests were reviewed and are high (elevated Triglycerides.). Pertinent negatives include no chest pain, leg pain, myalgias or shortness of breath. Current antihyperlipidemic treatment includes statins. Risk factors for coronary artery disease include dyslipidemia and male sex.  Anxiety  Presents for follow-up visit. Symptoms include muscle tension (tension in the neck caused by stress) and panic. Patient reports no chest pain, depressed mood, excessive worry, nausea, nervous/anxious behavior or shortness of breath. The severity of symptoms is moderate.    Gastroesophageal Reflux  He reports no abdominal pain, no belching, no chest pain, no choking, no coughing, no heartburn or no nausea. This is a chronic problem. He has tried an antacid and a PPI for the symptoms. Past procedures do not include an EGD.    Past Medical History:  Diagnosis Date  . Anxiety   . Erectile dysfunction   . GERD (gastroesophageal reflux disease)   . Hyperlipidemia   . Hypertension   . Obesity   . Palpitations    a. Previously evaluted when he was incarcerated in [place] - reportedly nl stress test.  . PVC's (premature ventricular contractions)    a. 04/2013 Echo: EF 50-55%, Gr1 DD, mildly dil LA;  b. 04/2017 48hr holter: freq PVCs (5.4%), rare PACs. Single 13 beat atrial run - 143 bpm.    Past Surgical History:  Procedure Laterality Date  . CARPAL TUNNEL RELEASE Bilateral   . HERNIA REPAIR  2015  . HIP SURGERY     a. Age 88 - failure of growth plate to close - Pins inserted    Family History  Problem Relation Age of Onset  . Diabetes Mother   . Hypertension Father   . Heart disease  Maternal Grandmother   . Heart attack Maternal Grandmother   . Diabetes Maternal Grandfather   . Diabetes Paternal Grandfather     Social History   Socioeconomic History  . Marital status: Single    Spouse name: Not on file  . Number of children: Not on file  . Years of education: Not on file  . Highest education level: Not on file  Social Needs  . Financial resource strain: Not on file  . Food insecurity - worry: Not on file  . Food insecurity - inability: Not on file  . Transportation needs - medical: Not on file  . Transportation needs - non-medical: Not on file  Occupational History  . Not on file  Tobacco Use  . Smoking status: Former Smoker    Packs/day: 1.00    Years: 20.00    Pack years: 20.00    Types: Cigarettes    Last attempt to quit: 09/15/2015    Years since quitting: 2.0  . Smokeless tobacco: Never Used  Substance and Sexual Activity  . Alcohol use: Yes    Alcohol/week: 0.0 oz    Comment: 3-6 cans/beer on weeknights, 6-12 beers on weekends.  . Drug use: No  . Sexual activity: Not on file  Other Topics Concern  . Not on file  Social History Narrative   Lives in [place] with his ex-girlfriend's brother.  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  Active @ work w/o limitations but does not routinely exercise.     Current Outpatient Medications:  .  atorvastatin (LIPITOR) 20 MG tablet, Take 1 tablet (20 mg total) by mouth daily at 6 PM., Disp: 90 tablet, Rfl: 0 .  clonazePAM (KLONOPIN) 0.5 MG tablet, Take 1 tablet (0.5 mg total) by mouth 3 (three) times daily as needed for anxiety., Disp: 90 tablet, Rfl: 2 .  losartan (COZAAR) 50 MG tablet, Take 1.5 tablets (75 mg total) by mouth daily., Disp: 135 tablet, Rfl: 0 .  pantoprazole (PROTONIX) 40 MG tablet, Take 1 tablet (40 mg total) by mouth daily., Disp: 90 tablet, Rfl: 0 .  metoprolol tartrate (LOPRESSOR) 25 MG tablet, Take 1 tablet (25 mg total) by mouth 2 (two) times daily., Disp: 180 tablet, Rfl: 3  No  Known Allergies   Review of Systems  Respiratory: Negative for cough, choking and shortness of breath.   Cardiovascular: Negative for chest pain.  Gastrointestinal: Negative for abdominal pain, heartburn and nausea.  Musculoskeletal: Negative for myalgias.  Psychiatric/Behavioral: The patient is not nervous/anxious.      Objective  Vitals:   09/21/17 1617  BP: 118/78  Pulse: (!) 114  Resp: 16  Temp: 98.3 F (36.8 C)  TempSrc: Oral  SpO2: 99%  Weight: 213 lb (96.6 kg)    Physical Exam  Constitutional: He is oriented to person, place, and time and well-developed, well-nourished, and in no distress.  HENT:  Head: Normocephalic and atraumatic.  Cardiovascular: Normal rate, regular rhythm and normal heart sounds.  No murmur heard. Pulmonary/Chest: Effort normal and breath sounds normal. He has no wheezes.  Abdominal: Soft. Bowel sounds are normal. There is no tenderness.  Musculoskeletal: He exhibits no edema.  Neurological: He is alert and oriented to person, place, and time.  Psychiatric: Mood, memory, affect and judgment normal.  Nursing note and vitals reviewed.       Assessment & Plan  1. Essential hypertension BP stable on present antihypertensive treatment - losartan (COZAAR) 50 MG tablet; Take 1.5 tablets (75 mg total) by mouth daily.  Dispense: 135 tablet; Refill: 0  2. Gastroesophageal reflux disease, esophagitis presence not specified  - pantoprazole (PROTONIX) 40 MG tablet; Take 1 tablet (40 mg total) by mouth daily.  Dispense: 30 tablet; Refill: 0  3. Generalized anxiety disorder Symptoms of anxiety are stable on clonazepam taken up to 3 times daily as needed - clonazePAM (KLONOPIN) 0.5 MG tablet; Take 1 tablet (0.5 mg total) by mouth 3 (three) times daily as needed for anxiety.  Dispense: 90 tablet; Refill: 2  4. Hypercholesterolemia with hypertriglyceridemia  - Lipid panel - COMPLETE METABOLIC PANEL WITH GFR - atorvastatin (LIPITOR) 20 MG  tablet; Take 1 tablet (20 mg total) by mouth daily at 6 PM.  Dispense: 30 tablet; Refill: 0   Ranay Ketter [facility] 09/21/2017 4:28 PM

## 2017-12-20 ENCOUNTER — Encounter: Payer: Self-pay | Admitting: Family Medicine

## 2017-12-20 ENCOUNTER — Ambulatory Visit: Payer: Managed Care, Other (non HMO) | Admitting: Family Medicine

## 2017-12-20 VITALS — BP 128/78 | HR 87 | Temp 98.2°F | Ht 71.0 in | Wt 211.6 lb

## 2017-12-20 DIAGNOSIS — M542 Cervicalgia: Secondary | ICD-10-CM

## 2017-12-20 DIAGNOSIS — Z5181 Encounter for therapeutic drug level monitoring: Secondary | ICD-10-CM | POA: Diagnosis not present

## 2017-12-20 DIAGNOSIS — I10 Essential (primary) hypertension: Secondary | ICD-10-CM

## 2017-12-20 DIAGNOSIS — Z113 Encounter for screening for infections with a predominantly sexual mode of transmission: Secondary | ICD-10-CM | POA: Diagnosis not present

## 2017-12-20 DIAGNOSIS — E782 Mixed hyperlipidemia: Secondary | ICD-10-CM | POA: Diagnosis not present

## 2017-12-20 DIAGNOSIS — E559 Vitamin D deficiency, unspecified: Secondary | ICD-10-CM | POA: Diagnosis not present

## 2017-12-20 NOTE — Assessment & Plan Note (Signed)
Controlled on current medicine; try DASH guidelines

## 2017-12-20 NOTE — Patient Instructions (Addendum)
Try to follow the DASH guidelines (DASH stands for Dietary Approaches to Stop Hypertension). Try to limit the sodium in your diet to no more than 1,500mg  of sodium per day. Certainly try to not exceed 2,000 mg per day at the very most. Do not add salt when cooking or at the table.  Check the sodium amount on labels when shopping, and choose items lower in sodium when given a choice. Avoid or limit foods that already contain a lot of sodium. Eat a diet rich in fruits and vegetables and whole grains, and try to lose weight if overweight or obese Never ever mix alcohol plus clonazepam, as that can cause fatal overdose We'll get labs today If you have not heard anything from my staff in a week about any orders/referrals/studies from today, please contact us here to follow-up [phone]   Gout Gout is painful swelling that can occur in some of your joints. Gout is a type of arthritis. This condition is caused by having too much uric acid in your body. Uric acid is a chemical that forms when your body breaks down substances called purines. Purines are important for building body proteins. When your body has too much uric acid, sharp crystals can form and build up inside your joints. This causes pain and swelling. Gout attacks can happen quickly and be very painful (acute gout). Over time, the attacks can affect more joints and become more frequent (chronic gout). Gout can also cause uric acid to build up under your skin and inside your kidneys. What are the causes? This condition is caused by too much uric acid in your blood. This can occur because:  Your kidneys do not remove enough uric acid from your blood. This is the most common cause.  Your body makes too much uric acid. This can occur with some cancers and cancer treatments. It can also occur if your body is breaking down too many red blood cells (hemolytic anemia).  You eat too many foods that are high in purines. These foods include organ  meats and some seafood. Alcohol, especially beer, is also high in purines.  A gout attack may be triggered by trauma or stress. What increases the risk? This condition is more likely to develop in people who:  Have a family history of gout.  Are male and middle-aged.  Are male and have gone through menopause.  Are obese.  Frequently drink alcohol, especially beer.  Are dehydrated.  Lose weight too quickly.  Have an organ transplant.  Have lead poisoning.  Take certain medicines, including aspirin, cyclosporine, diuretics, levodopa, and niacin.  Have kidney disease or psoriasis.  What are the signs or symptoms? An attack of acute gout happens quickly. It usually occurs in just one joint. The most common place is the big toe. Attacks often start at night. Other joints that may be affected include joints of the feet, ankle, knee, fingers, wrist, or elbow. Symptoms may include:  Severe pain.  Warmth.  Swelling.  Stiffness.  Tenderness. The affected joint may be very painful to touch.  Shiny, red, or purple skin.  Chills and fever.  Chronic gout may cause symptoms more frequently. More joints may be involved. You may also have white or yellow lumps (tophi) on your hands or feet or in other areas near your joints. How is this diagnosed? This condition is diagnosed based on your symptoms, medical history, and physical exam. You may have tests, such as:  Blood tests to measure uric  acid levels.  Removal of joint fluid with a needle (aspiration) to look for uric acid crystals.  X-rays to look for joint damage.  How is this treated? Treatment for this condition has two phases: treating an acute attack and preventing future attacks. Acute gout treatment may include medicines to reduce pain and swelling, including:  NSAIDs.  Steroids. These are strong anti-inflammatory medicines that can be taken by mouth (orally) or injected into a joint.  Colchicine. This  medicine relieves pain and swelling when it is taken soon after an attack. It can be given orally or through an IV tube.  Preventive treatment may include:  Daily use of smaller doses of NSAIDs or colchicine.  Use of a medicine that reduces uric acid levels in your blood.  Changes to your diet. You may need to see a specialist about healthy eating (dietitian).  Follow these instructions at home: During a Gout Attack  If directed, apply ice to the affected area: ? Put ice in a plastic bag. ? Place a towel between your skin and the bag. ? Leave the ice on for 20 minutes, 2-3 times a day.  Rest the joint as much as possible. If the affected joint is in your leg, you may be given crutches to use.  Raise (elevate) the affected joint above the level of your heart as often as possible.  Drink enough fluids to keep your urine clear or pale yellow.  Take over-the-counter and prescription medicines only as told by your health care provider.  Do not drive or operate heavy machinery while taking prescription pain medicine.  Follow instructions from your health care provider about eating or drinking restrictions.  Return to your normal activities as told by your health care provider. Ask your health care provider what activities are safe for you. Avoiding Future Gout Attacks  Follow a low-purine diet as told by your dietitian or health care provider. Avoid foods and drinks that are high in purines, including liver, kidney, anchovies, asparagus, herring, mushrooms, mussels, and beer.  Limit alcohol intake to no more than 1 drink a day for nonpregnant women and 2 drinks a day for men. One drink equals 12 oz of beer, 5 oz of wine, or 1 oz of hard liquor.  Maintain a healthy weight or lose weight if you are overweight. If you want to lose weight, talk with your health care provider. It is important that you do not lose weight too quickly.  Start or maintain an exercise program as told by your  health care provider.  Drink enough fluids to keep your urine clear or pale yellow.  Take over-the-counter and prescription medicines only as told by your health care provider.  Keep all follow-up visits as told by your health care provider. This is important. Contact a health care provider if:  You have another gout attack.  You continue to have symptoms of a gout attack after10 days of treatment.  You have side effects from your medicines.  You have chills or a fever.  You have burning pain when you urinate.  You have pain in your lower back or belly. Get help right away if:  You have severe or uncontrolled pain.  You cannot urinate. This information is not intended to replace advice given to you by your health care provider. Make sure you discuss any questions you have with your health care provider. Document Released: 08/27/2000 Document Revised: 02/05/2016 Document Reviewed: 06/12/2015 Elsevier Interactive Patient Education  2018 [place]  Diet Purines are compounds that affect the level of uric acid in your body. A low-purine diet is a diet that is low in purines. Eating a low-purine diet can prevent the level of uric acid in your body from getting too high and causing gout or kidney stones or both. What do I need to know about this diet?  Choose low-purine foods. Examples of low-purine foods are listed in the next section.  Drink plenty of fluids, especially water. Fluids can help remove uric acid from your body. Try to drink 8-16 cups (1.9-3.8 L) a day.  Limit foods high in fat, especially saturated fat, as fat makes it harder for the body to get rid of uric acid. Foods high in saturated fat include pizza, cheese, ice cream, whole milk, fried foods, and gravies. Choose foods that are lower in fat and lean sources of protein. Use olive oil when cooking as it contains healthy fats that are not high in saturated fat.  Limit alcohol. Alcohol interferes with  the elimination of uric acid from your body. If you are having a gout attack, avoid all alcohol.  Keep in mind that different people's bodies react differently to different foods. You will probably learn over time which foods do or do not affect you. If you discover that a food tends to cause your gout to flare up, avoid eating that food. You can more freely enjoy foods that do not cause problems. If you have any questions about a food item, talk to your dietitian or health care provider. Which foods are low, moderate, and high in purines? The following is a list of foods that are low, moderate, and high in purines. You can eat any amount of the foods that are low in purines. You may be able to have small amounts of foods that are moderate in purines. Ask your health care provider how much of a food moderate in purines you can have. Avoid foods high in purines. Grains  Foods low in purines: Enriched white bread, pasta, rice, cake, cornbread, popcorn.  Foods moderate in purines: Whole-grain breads and cereals, wheat germ, bran, oatmeal. Uncooked oatmeal. Dry wheat bran or wheat germ.  Foods high in purines: Pancakes, [place] toast, biscuits, muffins. Vegetables  Foods low in purines: All vegetables, except those that are moderate in purines.  Foods moderate in purines: Asparagus, cauliflower, spinach, mushrooms, green peas. Fruits  All fruits are low in purines. Meats and other Protein Foods  Foods low in purines: Eggs, nuts, peanut butter.  Foods moderate in purines: 80-90% lean beef, lamb, veal, pork, poultry, fish, eggs, peanut butter, nuts. Crab, lobster, oysters, and shrimp. Cooked dried beans, peas, and lentils.  Foods high in purines: Anchovies, sardines, herring, mussels, tuna, codfish, scallops, trout, and haddock. Berniece Salines. Organ meats (such as liver or kidney). Tripe. Game meat. Goose. Sweetbreads. Dairy  All dairy foods are low in purines. Low-fat and fat-free dairy products are  best because they are low in saturated fat. Beverages  Drinks low in purines: Water, carbonated beverages, tea, coffee, cocoa.  Drinks moderate in purines: Soft drinks and other drinks sweetened with high-fructose corn syrup. Juices. To find whether a food or drink is sweetened with high-fructose corn syrup, look at the ingredients list.  Drinks high in purines: Alcoholic beverages (such as beer). Condiments  Foods low in purines: Salt, herbs, olives, pickles, relishes, vinegar.  Foods moderate in purines: Butter, margarine, oils, mayonnaise. Fats and Oils  Foods low in purines: All types, except gravies  and sauces made with meat.  Foods high in purines: Gravies and sauces made with meat. Other Foods  Foods low in purines: Sugars, sweets, gelatin. Cake. Soups made without meat.  Foods moderate in purines: Meat-based or fish-based soups, broths, or bouillons. Foods and drinks sweetened with high-fructose corn syrup.  Foods high in purines: High-fat desserts (such as ice cream, cookies, cakes, pies, doughnuts, and chocolate). Contact your dietitian for more information on foods that are not listed here. This information is not intended to replace advice given to you by your health care provider. Make sure you discuss any questions you have with your health care provider. Document Released: 12/25/2010 Document Revised: 02/05/2016 Document Reviewed: 08/06/2013 Elsevier Interactive Patient Education  2017 [place].  12 Ways to [employer] Anxiety  ?Anxiety is normal human sensation. It is what helped our ancestors survive the pitfalls of the wilderness. Anxiety is defined as experiencing worry or nervousness about an imminent event or something with an uncertain outcome. It is a feeling experienced by most people at some point in their lives. Anxiety can be triggered by a very personal issue, such as the illness of a loved one, or an event of global proportions, such as a refugee  crisis. Some of the symptoms of anxiety are:  Feeling restless.  Having a feeling of impending danger.  Increased heart rate.  Rapid breathing. Sweating.  Shaking.  Weakness or feeling tired.  Difficulty concentrating on anything except the current worry.  Insomnia.  Stomach or bowel problems. What can we do about anxiety we may be feeling? There are many techniques to help manage stress and relax. Here are 12 ways you can reduce your anxiety almost immediately: 1. Turn off the constant feed of information. Take a social media sabbatical. Studies have shown that social media directly contributes to social anxiety.  2. Monitor your television viewing habits. Are you watching shows that are also contributing to your anxiety, such as 24-hour news stations? Try watching something else, or better yet, nothing at all. Instead, listen to music, read an inspirational book or practice a hobby. 3. Eat nutritious meals. Also, don't skip meals and keep healthful snacks on hand. Hunger and poor diet contributes to feeling anxious. 4. Sleep. Sleeping on a regular schedule for at least seven to eight hours a night will do wonders for your outlook when you are awake. 5. Exercise. Regular exercise will help rid your body of that anxious energy and help you get more restful sleep. 6. Try deep (diaphragmatic) breathing. Inhale slowly through your nose for five seconds and exhale through your mouth. 7. Practice acceptance and gratitude. When anxiety hits, accept that there are things out of your control that shouldn't be of immediate concern.  8. Seek out humor. When anxiety strikes, watch a funny video, read jokes or call a friend who makes you laugh. Laughter is healing for our bodies and releases endorphins that are calming. 9. Stay positive. Take the effort to replace negative thoughts with positive ones. Try to see a stressful situation in a positive light. Try to come up with solutions rather than dwelling on  the problem. 10. Figure out what triggers your anxiety. Keep a journal and make note of anxious moments and the events surrounding them. This will help you identify triggers you can avoid or even eliminate. 11. Talk to someone. Let a trusted friend, family member or even trained professional know that you are feeling overwhelmed and anxious. Verbalize what you are feeling and  why.  12. Volunteer. If your anxiety is triggered by a crisis on a large scale, become an advocate and work to resolve the problem that is causing you unease. Anxiety is often unwelcome and can become overwhelming. If not kept in check, it can become a disorder that could require medical treatment. However, if you take the time to care for yourself and avoid the triggers that make you anxious, you will be able to find moments of relaxation and clarity that make your life much more enjoyable.   Steps to Elicit the Relaxation Response The following is the technique reprinted with permission from Dr. Billie Ruddy book The Relaxation Response pages 162-163 1. Sit quietly in a comfortable position. 2. Close your eyes. 3. Deeply relax all your muscles,  beginning at your feet and progressing up to your face.  Keep them relaxed. 4. Breathe through your nose.  Become aware of your breathing.  As you breathe out, say the word, "one"*,  silently to yourself. For example,  breathe in ... out, "one",- in .. out, "one", etc.  Breathe easily and naturally. 5. Continue for 10 to 20 minutes.  You may open your eyes to check the time, but do not use an alarm.  When you finish, sit quietly for several minutes,  at first with your eyes closed and later with your eyes opened.  Do not stand up for a few minutes. 6. Do not worry about whether you are successful  in achieving a deep level of relaxation.  Maintain a passive attitude and permit relaxation to occur at its own pace.  When distracting thoughts occur,  try to ignore them  by not dwelling upon them  and return to repeating "one."  With practice, the response should come with little effort.  Practice the technique once or twice daily,  but not within two hours after any meal,  since the digestive processes seem to interfere with  the elicitation of the Relaxation Response. * It is better to use a soothing, mellifluous sound, preferably with no meaning. or association, to avoid stimulation of unnecessary thoughts - a mantra.

## 2017-12-20 NOTE — Assessment & Plan Note (Signed)
Recommended gentle stretching, no benzos from here

## 2017-12-20 NOTE — Assessment & Plan Note (Signed)
Check level and supplement if needed 

## 2017-12-20 NOTE — Progress Notes (Signed)
BP 128/78 (BP Location: Right Arm, Patient Position: Sitting, Cuff Size: Large)   Pulse 87   Temp 98.2 F (36.8 C) (Oral)   Ht 5\' 11"  (1.803 m)   Wt 211 lb 9.6 oz (96 kg)   SpO2 99%   BMI 29.51 kg/m    Subjective:    Patient ID: David Carlson, male    DOB: Jan 24, 1971, 47 y.o.   MRN: [id number]  HPI: David Carlson is a 47 y.o. male  Chief Complaint  Patient presents with  . Follow-up    HPI Patient is new to me; previous provider left our practice Occasional gout flares; last episode was at the end of last year; flared up again most recently two weeks ago; outside of the left foot; took the gout medicine and it was gone in 3 days; gets pain medicine from podiatrist; on his feet all the time; bone spurs; no known flare; did eat a few steaks and pork chops; does drink alcohol, "it depneds", might have a few beers at night after work and on the weekends, a little more  High blood pressure; controlled today; taking losartan; taking metoprolol for the heart beat; wore the heart monitor; takes the 50 mg losartan most days; might take the higher dose of 75 mg if needed  High cholesterol; taking statin; taking it for a long while; he took several, but has had to switch and they made him feel bad; busy at work; due for checking  GERD; on PPI; sees specialist  Vitamin D was low, 20 was the last reading; not taking extra vitamin D alone, just on multiple vitamin; riding motorcycle will get more sun  Used to see Dr. Manuella Ghazi for the klonopin; only takes it because he gets tension in the neck He was not taking them three times a day; he still has some left over, "at least 100 pills at the house" because he kept refilling them  Depression screen [facility] 2/9 12/20/2017 09/21/2017 05/19/2017 03/24/2017 01/13/2016  Decreased Interest 0 0 0 0 0  Down, Depressed, Hopeless 0 0 0 0 0  PHQ - 2 Score 0 0 0 0 0    Relevant past medical, surgical, family and social history reviewed Past Medical  History:  Diagnosis Date  . Anxiety   . Broken shoulder   . Collar bone fracture    x 2  . Erectile dysfunction   . GERD (gastroesophageal reflux disease)   . H/O Bell's palsy   . Hyperlipidemia   . Hypertension   . Obesity   . Palpitations    a. Previously evaluted when he was incarcerated in [place] - reportedly nl stress test.  . PVC's (premature ventricular contractions)    a. 04/2013 Echo: EF 50-55%, Gr1 DD, mildly dil LA;  b. 04/2017 48hr holter: freq PVCs (5.4%), rare PACs. Single 13 beat atrial run - 143 bpm.   Past Surgical History:  Procedure Laterality Date  . CARPAL TUNNEL RELEASE Bilateral   . HERNIA REPAIR  2015  . HIP SURGERY     a. Age 29 - failure of growth plate to close - Pins inserted   Family History  Problem Relation Age of Onset  . Diabetes Mother   . Hypertension Father   . Heart disease Maternal Grandmother   . Heart attack Maternal Grandmother   . Diabetes Maternal Grandfather   . Diabetes Paternal Grandfather    Social History   Tobacco Use  . Smoking status: Former Smoker  Packs/day: 1.00    Years: 20.00    Pack years: 20.00    Types: Cigarettes    Last attempt to quit: 09/15/2015    Years since quitting: 2.3  . Smokeless tobacco: Never Used  Substance Use Topics  . Alcohol use: Yes    Alcohol/week: 0.0 oz    Comment: 3-6 cans/beer on weeknights, 6-12 beers on weekends.  . Drug use: No    Interim medical history since last visit reviewed. Allergies and medications reviewed  Review of Systems Per HPI unless specifically indicated above     Objective:    BP 128/78 (BP Location: Right Arm, Patient Position: Sitting, Cuff Size: Large)   Pulse 87   Temp 98.2 F (36.8 C) (Oral)   Ht 5\' 11"  (1.803 m)   Wt 211 lb 9.6 oz (96 kg)   SpO2 99%   BMI 29.51 kg/m   Wt Readings from Last 3 Encounters:  12/20/17 211 lb 9.6 oz (96 kg)  09/21/17 213 lb (96.6 kg)  06/14/17 216 lb 8 oz (98.2 kg)    Physical Exam  Constitutional: He appears  well-developed and well-nourished. No distress.  HENT:  Head: Normocephalic and atraumatic.  Eyes: EOM are normal. No scleral icterus.  Neck: No thyromegaly present.  Cardiovascular: Normal rate and regular rhythm.  Pulmonary/Chest: Effort normal and breath sounds normal.  Abdominal: Soft. Bowel sounds are normal. He exhibits no distension.  Musculoskeletal: He exhibits no edema.  Neurological: Coordination normal.  Skin: Skin is warm and dry. No pallor.  Psychiatric: He has a normal mood and affect. His behavior is normal. Judgment and thought content normal.       Assessment & Plan:   Problem List Items Addressed This Visit      Cardiovascular and Mediastinum   BP (high blood pressure) - Primary    Controlled on current medicine; try DASH guidelines        Other   Vitamin D deficiency    Check level and supplement if needed      Relevant Orders   VITAMIN D 25 Hydroxy (Vit-D Deficiency, Fractures) (Completed)   Medication monitoring encounter    Check liver and kidneys      Relevant Orders   COMPLETE METABOLIC PANEL WITH GFR (Completed)   Hypercholesterolemia with hypertriglyceridemia    Check lipids; continue statin      Relevant Orders   Lipid panel (Completed)   Cervical pain    Recommended gentle stretching, no benzos from here       Other Visit Diagnoses    Screening examination for STD (sexually transmitted disease)       Relevant Orders   HIV antibody (Completed)       Follow up plan: Return in about 6 months (around 06/21/2018) for follow-up visit with Dr. Sanda Klein.  An after-visit summary was printed and given to the patient at [place].  Please see the patient instructions which may contain other information and recommendations beyond what is mentioned above in the assessment and plan.  No orders of the defined types were placed in this encounter.   Orders Placed This Encounter  Procedures  . HIV antibody  . COMPLETE METABOLIC PANEL WITH GFR    . Lipid panel  . VITAMIN D 25 Hydroxy (Vit-D Deficiency, Fractures)

## 2017-12-20 NOTE — Assessment & Plan Note (Signed)
Check lipids; continue statin 

## 2017-12-20 NOTE — Assessment & Plan Note (Signed)
Check liver and kidneys 

## 2017-12-21 ENCOUNTER — Other Ambulatory Visit: Payer: Self-pay | Admitting: Family Medicine

## 2017-12-21 LAB — COMPLETE METABOLIC PANEL WITH GFR
AG RATIO: 2.1 (calc) (ref 1.0–2.5)
ALT: 56 U/L — ABNORMAL HIGH (ref 9–46)
AST: 44 U/L — AB (ref 10–40)
Albumin: 4.7 g/dL (ref 3.6–5.1)
Alkaline phosphatase (APISO): 57 U/L (ref 40–115)
BUN: 16 mg/dL (ref 7–25)
CALCIUM: 9.7 mg/dL (ref 8.6–10.3)
CO2: 28 mmol/L (ref 20–32)
Chloride: 105 mmol/L (ref 98–110)
Creat: 1.12 mg/dL (ref 0.60–1.35)
GFR, EST AFRICAN AMERICAN: 91 mL/min/{1.73_m2} (ref >=60)
GFR, EST NON AFRICAN AMERICAN: 79 mL/min/{1.73_m2} (ref >=60)
GLOBULIN: 2.2 g/dL (ref 1.9–3.7)
Glucose, Bld: 87 mg/dL (ref 65–139)
POTASSIUM: 4.5 mmol/L (ref 3.5–5.3)
SODIUM: 142 mmol/L (ref 135–146)
Total Bilirubin: 0.6 mg/dL (ref 0.2–1.2)
Total Protein: 6.9 g/dL (ref 6.1–8.1)

## 2017-12-21 LAB — LIPID PANEL
Cholesterol: 236 mg/dL — ABNORMAL HIGH (ref <200)
HDL: 40 mg/dL — AB (ref >40)
NON-HDL CHOLESTEROL (CALC): 196 mg/dL — AB (ref <130)
Total CHOL/HDL Ratio: 5.9 (calc) — ABNORMAL HIGH (ref <5.0)
Triglycerides: 635 mg/dL — ABNORMAL HIGH (ref <150)

## 2017-12-21 LAB — VITAMIN D 25 HYDROXY (VIT D DEFICIENCY, FRACTURES): Vit D, 25-Hydroxy: 14 ng/mL — ABNORMAL LOW (ref 30–100)

## 2017-12-21 LAB — HIV ANTIBODY (ROUTINE TESTING W REFLEX): HIV 1&2 Ab, 4th Generation: NONREACTIVE

## 2017-12-21 MED ORDER — ICOSAPENT ETHYL 1 G PO CAPS: 2.0000 | ORAL_CAPSULE | Freq: Two times a day (BID) | ORAL | 11 refills | Status: DC

## 2017-12-21 MED ORDER — VITAMIN D (ERGOCALCIFEROL) 1.25 MG (50000 UNIT) PO CAPS: 50000.0000 [IU] | ORAL_CAPSULE | ORAL | 1 refills | Status: AC

## 2017-12-21 NOTE — Progress Notes (Signed)
Start vascepa; start Rx vit D

## 2017-12-22 ENCOUNTER — Telehealth: Payer: Self-pay

## 2017-12-22 NOTE — Telephone Encounter (Signed)
-----   Message from Arnetha Courser, MD sent at 12/21/2017  2:59 PM EDT ----- Guerry Minors, please let the patient know that his HIV is negative; his liver enzymes are up somewhat; he drinks alcohol, so we're going to encourage him to decrease his alcohol by 50% over this week, then get down to no more than 14 drinks total per week; he may end up with cirrhosis of the liver if he continues to drink more than is safe; his TG are very high, over 600; start vascepa; his vit D is also very low, so start vit D Rx weekly for 8 weeks, then take 1,000 iu vitamin D3 once a day; thank you

## 2017-12-22 NOTE — Telephone Encounter (Signed)
Called pt no answer. LM for pt informing him of the information below, advised pt to call back for questions or concerns. CRM created. Labs routed to [facility]. Text sent for mychart sign up.

## 2018-01-31 ENCOUNTER — Ambulatory Visit: Payer: Managed Care, Other (non HMO) | Admitting: Nurse Practitioner

## 2018-01-31 DIAGNOSIS — R0989 Other specified symptoms and signs involving the circulatory and respiratory systems: Secondary | ICD-10-CM

## 2018-02-02 ENCOUNTER — Encounter: Payer: Self-pay | Admitting: Nurse Practitioner

## 2018-02-21 ENCOUNTER — Encounter: Payer: Self-pay | Admitting: Family Medicine

## 2018-02-21 ENCOUNTER — Ambulatory Visit (INDEPENDENT_AMBULATORY_CARE_PROVIDER_SITE_OTHER): Payer: Managed Care, Other (non HMO) | Admitting: Family Medicine

## 2018-02-21 VITALS — BP 140/82 | HR 69 | Temp 98.0°F | Resp 14 | Ht 69.5 in | Wt 204.3 lb

## 2018-02-21 DIAGNOSIS — Z0000 Encounter for general adult medical examination without abnormal findings: Secondary | ICD-10-CM

## 2018-02-21 DIAGNOSIS — R7989 Other specified abnormal findings of blood chemistry: Secondary | ICD-10-CM

## 2018-02-21 DIAGNOSIS — R059 Cough, unspecified: Secondary | ICD-10-CM

## 2018-02-21 DIAGNOSIS — R945 Abnormal results of liver function studies: Secondary | ICD-10-CM

## 2018-02-21 DIAGNOSIS — R05 Cough: Secondary | ICD-10-CM

## 2018-02-21 DIAGNOSIS — L989 Disorder of the skin and subcutaneous tissue, unspecified: Secondary | ICD-10-CM | POA: Diagnosis not present

## 2018-02-21 DIAGNOSIS — Z23 Encounter for immunization: Secondary | ICD-10-CM | POA: Diagnosis not present

## 2018-02-21 DIAGNOSIS — Z7289 Other problems related to lifestyle: Secondary | ICD-10-CM

## 2018-02-21 DIAGNOSIS — I10 Essential (primary) hypertension: Secondary | ICD-10-CM

## 2018-02-21 DIAGNOSIS — E782 Mixed hyperlipidemia: Secondary | ICD-10-CM

## 2018-02-21 DIAGNOSIS — Z789 Other specified health status: Secondary | ICD-10-CM | POA: Diagnosis not present

## 2018-02-21 MED ORDER — TETANUS-DIPHTH-ACELL PERTUSSIS 5-2.5-18.5 LF-MCG/0.5 IM SUSP
0.5000 mL | Freq: Once | INTRAMUSCULAR | Status: AC
  Administered 2018-02-21: 0.5 mL via INTRAMUSCULAR

## 2018-02-21 NOTE — Assessment & Plan Note (Signed)
USPSTF grade A and B recommendations reviewed with patient; age-appropriate recommendations, preventive care, screening tests, etc discussed and encouraged; healthy living encouraged; see AVS for patient education given to patient  

## 2018-02-21 NOTE — Assessment & Plan Note (Signed)
Not at goal, but patient did not take medicine today; controlled last visit

## 2018-02-21 NOTE — Assessment & Plan Note (Signed)
Reviewed last labs with him; start Vascepa; at pharmacy; return in 6 weeks to recheck FASTING lipids

## 2018-02-21 NOTE — Progress Notes (Signed)
Patient ID: David Carlson, male   DOB: 1970/09/20, 47 y.o.   MRN: [id number]   Subjective:   David Carlson is a 47 y.o. male here for a complete physical exam  Interim issues since last visit:  Nothing; he did not get the results of the last labs; reviewed with him today; elev LFTs, high TG  USPSTF grade A and B recommendations Depression:  Depression screen [facility] 2/9 02/21/2018 12/20/2017 09/21/2017 05/19/2017 03/24/2017  Decreased Interest 0 0 0 0 0  Down, Depressed, Hopeless 0 0 0 0 0  PHQ - 2 Score 0 0 0 0 0   Hypertension: he has not taken his medicine today; he rode his bike and left his medicine; running behind and forgot; riding bike in the heat; did have some salt and vinegar chips earlier today; venison too but had seasoning salt on it; working a lot, working on the house, not super worked up today; got overheated Thursday; staying hydrated BP Readings from Last 3 Encounters:  02/21/18 140/82  12/20/17 128/78  09/21/17 118/78   Obesity: lost weight since last visit; he is watching his diet Wt Readings from Last 3 Encounters:  02/21/18 204 lb 4.8 oz (92.7 kg)  12/20/17 211 lb 9.6 oz (96 kg)  09/21/17 213 lb (96.6 kg)   BMI Readings from Last 3 Encounters:  02/21/18 29.74 kg/m  12/20/17 29.51 kg/m  09/21/17 29.71 kg/m    Immunizations: tetanus is due; given today Skin cancer: he had one on the back, one on the scalp Lung cancer:  Vape; last cigarette was not long ago, just one here or there Prostate cancer: no one in the family; start at age 60 No results found for: PSA Colorectal cancer: no one in the family, start at age 60 AAA: n/a Aspirin: n/a Diet: typical American eater; watches carbs; eats meat, not many veggies, drinks V-8 juices; picky with veggies; will eat grilled onions and grilled peppers Exercise: active job Alcohol: yes, 12-18 per week; discussed limiting Tobacco use: discussed HIV, hep B, hep C: not interested STD testing and prevention  (chl/gon/syphilis): not interested Glucose: okay Glucose  Date Value Ref Range Status  05/27/2014 96 65 - 99 mg/dL Final  01/04/2013 91 65 - 99 mg/dL Final   Glucose, Bld  Date Value Ref Range Status  12/20/2017 87 65 - 139 mg/dL Final    Comment:    .        Non-fasting reference interval .   05/19/2017 91 65 - 99 mg/dL Final    Comment:    .            Fasting reference interval .   10/15/2016 79 65 - 99 mg/dL Final   Lipids:  Lab Results  Component Value Date   CHOL 236 (H) 12/20/2017   CHOL 160 05/19/2017   CHOL 222 (H) 06/15/2016   Lab Results  Component Value Date   HDL 40 (L) 12/20/2017   HDL 43 05/19/2017   HDL 36 (L) 06/15/2016   Lab Results  Component Value Date   [facility]  12/20/2017     Comment:     . LDL cholesterol not calculated. Triglyceride levels greater than 400 mg/dL invalidate calculated LDL results. . Reference range: <100 . Desirable range <100 mg/dL for primary prevention;   <70 mg/dL for patients with CHD or diabetic patients  with > or = 2 CHD risk factors. Marland Kitchen LDL-C is now calculated using the Martin-Hopkins  calculation, which is  a validated novel method providing  better accuracy than the Friedewald equation in the  estimation of LDL-C.  Cresenciano Genre et al. Annamaria Helling. 8182;993(71): 2061-2068  (http://education.[facility].com/faq/FAQ164)    [place]  05/19/2017     Comment:     . LDL cholesterol not calculated. Triglyceride levels greater than 400 mg/dL invalidate calculated LDL results. . Reference range: <100 . Desirable range <100 mg/dL for primary prevention;   <70 mg/dL for patients with CHD or diabetic patients  with > or = 2 CHD risk factors. Marland Kitchen LDL-C is now calculated using the Martin-Hopkins  calculation, which is a validated novel method providing  better accuracy than the Friedewald equation in the  estimation of LDL-C.  Cresenciano Genre et al. Annamaria Helling. 6967;893(81): 2061-2068    (http://education.[facility].com/faq/FAQ164)    LDLCALC 113 06/15/2016   Lab Results  Component Value Date   TRIG 635 (H) 12/20/2017   TRIG 430 (H) 05/19/2017   TRIG 363 (H) 06/15/2016   Lab Results  Component Value Date   CHOLHDL 5.9 (H) 12/20/2017   CHOLHDL 3.7 05/19/2017   CHOLHDL 6.2 (H) 06/15/2016   No results found for: LDLDIRECT   Past Medical History:  Diagnosis Date  . Anxiety   . Broken shoulder   . Collar bone fracture    x 2  . Erectile dysfunction   . GERD (gastroesophageal reflux disease)   . H/O Bell's palsy   . Hyperlipidemia   . Hypertension   . Obesity   . Palpitations    a. Previously evaluted when he was incarcerated in [place] - reportedly nl stress test.  . PVC's (premature ventricular contractions)    a. 04/2013 Echo: EF 50-55%, Gr1 DD, mildly dil LA;  b. 04/2017 48hr holter: freq PVCs (5.4%), rare PACs. Single 13 beat atrial run - 143 bpm.   Past Surgical History:  Procedure Laterality Date  . CARPAL TUNNEL RELEASE Bilateral   . HERNIA REPAIR  2015  . HIP SURGERY     a. Age 76 - failure of growth plate to close - Pins inserted   Family History  Problem Relation Age of Onset  . Diabetes Mother   . Hypertension Father   . Heart disease Maternal Grandmother   . Heart attack Maternal Grandmother   . Diabetes Maternal Grandfather   . Diabetes Paternal Grandfather    Social History   Tobacco Use  . Smoking status: Former Smoker    Packs/day: 1.00    Years: 20.00    Pack years: 20.00    Types: Cigarettes    Last attempt to quit: 09/15/2015    Years since quitting: 2.4  . Smokeless tobacco: Never Used  Substance Use Topics  . Alcohol use: Yes    Alcohol/week: 0.0 oz    Comment: 3-6 cans/beer on weeknights, 6-12 beers on weekends.  . Drug use: No   Review of Systems  Constitutional: Negative for unexpected weight change.  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   Eyes: Negative for  visual disturbance.  Respiratory: Positive for cough (comes and goes in the throat, just a catch; declined seeing specialist). Negative for shortness of breath and wheezing.   Gastrointestinal: Negative for blood in stool.       Reflux; not related to the cough; no heartburn if taking medicine; going to see GI  Endocrine: Negative for polydipsia.       Stays hydrated at work, but hot  Genitourinary: Negative for hematuria.  Musculoskeletal:  Positive for arthralgias (left elbow).  Skin:       One spot on right upper back, one on right side scalp; he declined referral to derm  Allergic/Immunologic: Negative for food allergies.  Neurological: Negative for tremors and seizures.    Objective:   Vitals:   02/21/18 1550 02/21/18 1553  BP: (!) 152/88 140/82  Pulse: 69   Resp: 14   Temp: 98 F (36.7 C)   TempSrc: Oral   SpO2: 96%   Weight: 204 lb 4.8 oz (92.7 kg)   Height: 5' 9.5" (1.765 m)    Body mass index is 29.74 kg/m. Wt Readings from Last 3 Encounters:  02/21/18 204 lb 4.8 oz (92.7 kg)  12/20/17 211 lb 9.6 oz (96 kg)  09/21/17 213 lb (96.6 kg)   Physical Exam  Constitutional: He appears well-developed and well-nourished. No distress.  HENT:  Head: Normocephalic and atraumatic.  Nose: Nose normal.  Mouth/Throat: Oropharynx is clear and moist.  Eyes: EOM are normal. No scleral icterus.  Neck: No JVD present. No thyromegaly present.  Cardiovascular: Normal rate, regular rhythm and normal heart sounds.  Pulmonary/Chest: Effort normal and breath sounds normal. No respiratory distress. He has no wheezes. He has no rales.  Abdominal: Soft. Bowel sounds are normal. He exhibits no distension. There is no tenderness. There is no guarding.  Musculoskeletal: Normal range of motion. He exhibits no edema.  Lymphadenopathy:    He has no cervical adenopathy.  Neurological: He is alert. He displays normal reflexes. He exhibits normal muscle tone. Coordination normal.  Skin: Skin is  warm and dry. No rash noted. He is not diaphoretic. No erythema. No pallor.     SK on the tattoo upper RIGTH side back; superficial lesion RIGHT side scalp, no induration, scratch vs very early AK  Psychiatric: He has a normal mood and affect. His behavior is normal. Judgment and thought content normal.    Assessment/Plan:   Problem List Items Addressed This Visit      Cardiovascular and Mediastinum   BP (high blood pressure)    Not at goal, but patient did not take medicine today; controlled last visit        Other   Hypercholesterolemia with hypertriglyceridemia    Reviewed last labs with him; start [place]; at pharmacy; return in 6 weeks to recheck FASTING lipids      Annual physical exam - Primary    USPSTF grade A and B recommendations reviewed with patient; age-appropriate recommendations, preventive care, screening tests, etc discussed and encouraged; healthy living encouraged; see AVS for patient education given to patient        Other Visit Diagnoses    Alcohol use       discussed safe limits; no more than 4 drinks per occasion and no more than 14 drinks per week; if difficulty cutting back, contact me, here to help   Elevated LFTs       mild, but may be secondary to EtOH use; cut back; contact me if any difficulty; also may be related to fatty liver, high TG; recheck labs in 6 weeks   Skin lesion of scalp       patient politely declined referral to derm; will watch; protect from sun   Cough       patient declined further work-up/referral; continue anti-reflux medicine   Need for Tdap vaccination       Relevant Medications   Tdap (BOOSTRIX) injection 0.5 mL (Completed)      Meds ordered  this encounter  Medications  . Tdap (BOOSTRIX) injection 0.5 mL   No orders of the defined types were placed in this encounter.   Follow up plan: Return in about 1 year (around 02/22/2019) for complete physical; return in 6 weeks just for fasting labs (cholesterol).  An  After Visit Summary was printed and given to the patient.

## 2018-02-21 NOTE — Patient Instructions (Addendum)
Be cautious about flavored vape products Please start on the Vascepa for your triglycerides and the vitamin D  Your heart doctor is:___________________________  Your GI doctor is: _____________________________  David Carlson received the vaccine to protect against tetanus and diphtheria and pertussis today; the tetanus and diphtheria portions will provide protection up to ten years, and the pertussis component will give you protection against whooping cough for life  Try to lose another ten pounds (fat) while you build muscle  Do take your blood pressure medicine daily  Try to follow the DASH guidelines (DASH stands for Dietary Approaches to Stop Hypertension). Try to limit the sodium in your diet to no more than 1,500mg  of sodium per day. Certainly try to not exceed 2,000 mg per day at the very most. Do not add salt when cooking or at the table.  Check the sodium amount on labels when shopping, and choose items lower in sodium when given a choice. Avoid or limit foods that already contain a lot of sodium. Eat a diet rich in fruits and vegetables and whole grains, and try to lose weight if overweight or obese   Health Maintenance, Male A healthy lifestyle and preventive care is important for your health and wellness. Ask your health care provider about what schedule of regular examinations is right for you. What should I know about weight and diet? Eat a Healthy Diet  Eat plenty of vegetables, fruits, whole grains, low-fat dairy products, and lean protein.  Do not eat a lot of foods high in solid fats, added sugars, or salt.  Maintain a Healthy Weight Regular exercise can help you achieve or maintain a healthy weight. You should:  Do at least 150 minutes of exercise each week. The exercise should increase your heart rate and make you sweat (moderate-intensity exercise).  Do strength-training exercises at least twice a week.  Watch Your Levels of Cholesterol and Blood Lipids  Have your blood  tested for lipids and cholesterol every 5 years starting at 46 years of age. If you are at high risk for heart disease, you should start having your blood tested when you are 46 years old. You may need to have your cholesterol levels checked more often if: ? Your lipid or cholesterol levels are high. ? You are older than 46 years of age. ? You are at high risk for heart disease.  What should I know about cancer screening? Many types of cancers can be detected early and may often be prevented. Lung Cancer  You should be screened every year for lung cancer if: ? You are a current smoker who has smoked for at least 30 years. ? You are a former smoker who has quit within the past 15 years.  Talk to your health care provider about your screening options, when you should start screening, and how often you should be screened.  Colorectal Cancer  Routine colorectal cancer screening usually begins at 46 years of age and should be repeated every 5-10 years until you are 46 years old. You may need to be screened more often if early forms of precancerous polyps or small growths are found. Your health care provider may recommend screening at an earlier age if you have risk factors for colon cancer.  Your health care provider may recommend using home test kits to check for hidden blood in the stool.  A small camera at the end of a tube can be used to examine your colon (sigmoidoscopy or colonoscopy). This checks for the  earliest forms of colorectal cancer.  Prostate and Testicular Cancer  Depending on your age and overall health, your health care provider may do certain tests to screen for prostate and testicular cancer.  Talk to your health care provider about any symptoms or concerns you have about testicular or prostate cancer.  Skin Cancer  Check your skin from head to toe regularly.  Tell your health care provider about any new moles or changes in moles, especially if: ? There is a change in  a mole's size, shape, or color. ? You have a mole that is larger than a pencil eraser.  Always use sunscreen. Apply sunscreen liberally and repeat throughout the day.  Protect yourself by wearing long sleeves, pants, a wide-brimmed hat, and sunglasses when outside.  What should I know about heart disease, diabetes, and high blood pressure?  If you are 4-18 years of age, have your blood pressure checked every 3-5 years. If you are 33 years of age or older, have your blood pressure checked every year. You should have your blood pressure measured twice-once when you are at a hospital or clinic, and once when you are not at a hospital or clinic. Record the average of the two measurements. To check your blood pressure when you are not at a hospital or clinic, you can use: ? An automated blood pressure machine at a pharmacy. ? A home blood pressure monitor.  Talk to your health care provider about your target blood pressure.  If you are between 41-80 years old, ask your health care provider if you should take aspirin to prevent heart disease.  Have regular diabetes screenings by checking your fasting blood sugar level. ? If you are at a normal weight and have a low risk for diabetes, have this test once every three years after the age of 25. ? If you are overweight and have a high risk for diabetes, consider being tested at a younger age or more often.  A one-time screening for abdominal aortic aneurysm (AAA) by ultrasound is recommended for men aged 55-75 years who are current or former smokers. What should I know about preventing infection? Hepatitis B If you have a higher risk for hepatitis B, you should be screened for this virus. Talk with your health care provider to find out if you are at risk for hepatitis B infection. Hepatitis C Blood testing is recommended for:  Everyone born from [birth year] through 1964.  Anyone with known risk factors for hepatitis C.  Sexually Transmitted Diseases  (STDs)  You should be screened each year for STDs including gonorrhea and chlamydia if: ? You are sexually active and are younger than 46 years of age. ? You are older than 46 years of age and your health care provider tells you that you are at risk for this type of infection. ? Your sexual activity has changed since you were last screened and you are at an increased risk for chlamydia or gonorrhea. Ask your health care provider if you are at risk.  Talk with your health care provider about whether you are at high risk of being infected with HIV. Your health care provider may recommend a prescription medicine to help prevent HIV infection.  What else can I do?  Schedule regular health, dental, and eye exams.  Stay current with your vaccines (immunizations).  Do not use any tobacco products, such as cigarettes, chewing tobacco, and e-cigarettes. If you need help quitting, ask your health care provider.  Limit alcohol  intake to no more than 2 drinks per day. One drink equals 12 ounces of beer, 5 ounces of wine, or 1 ounces of hard liquor.  Do not use street drugs.  Do not share needles.  Ask your health care provider for help if you need support or information about quitting drugs.  Tell your health care provider if you often feel depressed.  Tell your health care provider if you have ever been abused or do not feel safe at home. This information is not intended to replace advice given to you by your health care provider. Make sure you discuss any questions you have with your health care provider. Document Released: 02/26/2008 Document Revised: 04/28/2016 Document Reviewed: 06/03/2015 Elsevier Interactive Patient Education  2018 [place] Choices to Lower Your Triglycerides Triglycerides are a type of fat in your blood. High levels of triglycerides can increase the risk of heart disease and stroke. If your triglyceride levels are high, the foods you eat and your eating habits  are very important. Choosing the right foods can help lower your triglycerides. What general guidelines do I need to follow?  Lose weight if you are overweight.  Limit or avoid alcohol.  Fill one half of your plate with vegetables and green salads.  Limit fruit to two servings a day. Choose fruit instead of juice.  Make one fourth of your plate whole grains. Look for the word "whole" as the first word in the ingredient list.  Fill one fourth of your plate with lean protein foods.  Enjoy fatty fish (such as salmon, mackerel, sardines, and tuna) three times a week.  Choose healthy fats.  Limit foods high in starch and sugar.  Eat more home-cooked food and less restaurant, buffet, and fast food.  Limit fried foods.  Cook foods using methods other than frying.  Limit saturated fats.  Check ingredient lists to avoid foods with partially hydrogenated oils (trans fats) in them. What foods can I eat? Grains Whole grains, such as whole wheat or whole grain breads, crackers, cereals, and pasta. Unsweetened oatmeal, bulgur, barley, quinoa, or brown rice. Corn or whole wheat flour tortillas. Vegetables Fresh or frozen vegetables (raw, steamed, roasted, or grilled). Green salads. Fruits All fresh, canned (in natural juice), or frozen fruits. Meat and Other Protein Products Ground beef (85% or leaner), grass-fed beef, or beef trimmed of fat. Skinless chicken or [place]. Ground chicken or [place]. Pork trimmed of fat. All fish and seafood. Eggs. Dried beans, peas, or lentils. Unsalted nuts or seeds. Unsalted canned or dry beans. Dairy Low-fat dairy products, such as skim or 1% milk, 2% or reduced-fat cheeses, low-fat ricotta or cottage cheese, or plain low-fat yogurt. Fats and Oils Tub margarines without trans fats. Light or reduced-fat mayonnaise and salad dressings. Avocado. Safflower, olive, or canola oils. Natural peanut or almond butter. The items listed above may not be a complete  list of recommended foods or beverages. Contact your dietitian for more options. What foods are not recommended? Grains White bread. White pasta. White rice. Cornbread. Bagels, pastries, and croissants. Crackers that contain trans fat. Vegetables White potatoes. Corn. Creamed or fried vegetables. Vegetables in a cheese sauce. Fruits Dried fruits. Canned fruit in light or heavy syrup. Fruit juice. Meat and Other Protein Products Fatty cuts of meat. Ribs, chicken wings, bacon, sausage, bologna, salami, chitterlings, fatback, hot dogs, bratwurst, and packaged luncheon meats. Dairy Whole or 2% milk, cream, half-and-half, and cream cheese. Whole-fat or sweetened yogurt. Full-fat cheeses. Nondairy creamers and whipped  toppings. Processed cheese, cheese spreads, or cheese curds. Sweets and Desserts Corn syrup, sugars, honey, and molasses. Candy. Jam and jelly. Syrup. Sweetened cereals. Cookies, pies, cakes, donuts, muffins, and ice cream. Fats and Oils Butter, stick margarine, lard, shortening, ghee, or bacon fat. Coconut, palm kernel, or palm oils. Beverages Alcohol. Sweetened drinks (such as sodas, lemonade, and fruit drinks or punches). The items listed above may not be a complete list of foods and beverages to avoid. Contact your dietitian for more information. This information is not intended to replace advice given to you by your health care provider. Make sure you discuss any questions you have with your health care provider. Document Released: 06/17/2004 Document Revised: 02/05/2016 Document Reviewed: 07/04/2013 Elsevier Interactive Patient Education  2017 [employer].

## 2018-03-03 ENCOUNTER — Other Ambulatory Visit: Payer: Self-pay | Admitting: Family Medicine

## 2018-03-03 DIAGNOSIS — K219 Gastro-esophageal reflux disease without esophagitis: Secondary | ICD-10-CM

## 2018-03-06 ENCOUNTER — Telehealth: Payer: Self-pay | Admitting: Family Medicine

## 2018-03-06 NOTE — Telephone Encounter (Signed)
Left voicemail sent on 6/21

## 2018-03-06 NOTE — Telephone Encounter (Signed)
Copied from [place] [id number]. Topic: Quick Communication - Rx Refill/Question >> Mar 06, 2018 12:16 PM Neva Seat wrote: pantoprazole (PROTONIX) 40 MG tablet  Needing refills - out  [facility] (N), [place] [address] [place] ([place]) [place] [address] Phone: [phone] Fax: [phone]

## 2018-04-10 ENCOUNTER — Other Ambulatory Visit: Payer: Self-pay

## 2018-04-10 DIAGNOSIS — I10 Essential (primary) hypertension: Secondary | ICD-10-CM

## 2018-04-10 DIAGNOSIS — E782 Mixed hyperlipidemia: Secondary | ICD-10-CM

## 2018-04-10 MED ORDER — ATORVASTATIN CALCIUM 20 MG PO TABS: 20.0000 mg | ORAL_TABLET | Freq: Every day | ORAL | 0 refills | Status: DC

## 2018-04-10 MED ORDER — LOSARTAN POTASSIUM 50 MG PO TABS: 75.0000 mg | ORAL_TABLET | Freq: Every day | ORAL | 0 refills | Status: DC

## 2018-06-26 ENCOUNTER — Ambulatory Visit: Payer: Managed Care, Other (non HMO) | Admitting: Family Medicine

## 2018-07-21 ENCOUNTER — Telehealth: Payer: Self-pay | Admitting: Family Medicine

## 2018-07-21 DIAGNOSIS — I10 Essential (primary) hypertension: Secondary | ICD-10-CM

## 2018-07-21 DIAGNOSIS — K219 Gastro-esophageal reflux disease without esophagitis: Secondary | ICD-10-CM

## 2018-07-21 MED ORDER — LOSARTAN POTASSIUM 50 MG PO TABS
75.0000 mg | ORAL_TABLET | Freq: Every day | ORAL | 0 refills | Status: DC
Start: 2018-07-21 — End: 2018-10-13

## 2018-07-21 MED ORDER — PANTOPRAZOLE SODIUM 40 MG PO TBEC: 40.0000 mg | DELAYED_RELEASE_TABLET | Freq: Every day | ORAL | 0 refills | Status: DC | PRN

## 2018-07-21 NOTE — Telephone Encounter (Signed)
Copied from [place] [id number]. Topic: Quick Communication - Rx Refill/Question >> Jul 21, 2018  2:21 PM Sheran Luz wrote: Medication: losartan (COZAAR) 50 MG tablet AND  pantoprazole (PROTONIX) 40 MG tablet   Patient is requesting refills of these medications.    Preferred Pharmacy (with phone number or street name):[facility] ([facility]), [place] [address]  [phone] (Phone) [phone] (Fax)

## 2018-07-21 NOTE — Telephone Encounter (Signed)
Please have patient schedule an appointment He canceled his last one and we need to see him I sent in one month of his losartan and protonix, but we'll want to get labs and see him prior to further refills; thank you

## 2018-07-24 NOTE — Telephone Encounter (Signed)
lvm [phone] @ 9:53 informing pt to return call to schedule appt

## 2018-09-29 ENCOUNTER — Ambulatory Visit: Payer: Managed Care, Other (non HMO) | Admitting: Nurse Practitioner

## 2018-09-29 ENCOUNTER — Ambulatory Visit
Admission: RE | Admit: 2018-09-29 | Discharge: 2018-09-29 | Disposition: A | Discharge status: Home / Self Care | Payer: Managed Care, Other (non HMO) | Attending: Nurse Practitioner | Admitting: Nurse Practitioner

## 2018-09-29 ENCOUNTER — Encounter: Payer: Self-pay | Admitting: Nurse Practitioner

## 2018-09-29 ENCOUNTER — Ambulatory Visit
Admission: RE | Admit: 2018-09-29 | Discharge: 2018-09-29 | Disposition: A | Discharge status: Home / Self Care | Payer: Managed Care, Other (non HMO) | Source: Ambulatory Visit | Attending: Nurse Practitioner | Admitting: Nurse Practitioner

## 2018-09-29 VITALS — BP 126/80 | HR 114 | Temp 98.2°F | Resp 18 | Ht 69.5 in | Wt 213.7 lb

## 2018-09-29 DIAGNOSIS — R059 Cough, unspecified: Secondary | ICD-10-CM

## 2018-09-29 DIAGNOSIS — R05 Cough: Secondary | ICD-10-CM

## 2018-09-29 DIAGNOSIS — R0602 Shortness of breath: Secondary | ICD-10-CM

## 2018-09-29 MED ORDER — PROMETHAZINE-DM 6.25-15 MG/5ML PO SYRP: 2.5000 mL | ORAL_SOLUTION | Freq: Four times a day (QID) | ORAL | 0 refills | Status: DC | PRN

## 2018-09-29 MED ORDER — DOXYCYCLINE HYCLATE 100 MG PO TABS: 100.0000 mg | ORAL_TABLET | Freq: Two times a day (BID) | ORAL | 0 refills | Status: DC

## 2018-09-29 NOTE — Progress Notes (Signed)
Name: David Carlson   MRN: [id number]    DOB: 17-May-1971   Date:09/29/2018       Progress Note  Subjective  Chief Complaint  Chief Complaint  Patient presents with  . URI    HPI  Patient was seen last month at [facility] and dx with URI and back strain states had muscle relaxers and back pain went away. States URI symptoms have not improved; notes shortness of breath initially with exertion now even at rest. Endorses fevers and chills, cough worse when laying down.   Patient Active Problem List   Diagnosis Date Noted  . Medication monitoring encounter 12/20/2017  . Annual physical exam 10/15/2016  . Vitamin D deficiency 01/13/2016  . Erectile dysfunction 01/13/2016  . Encounter for annual health examination 10/01/2015  . Fatigue 10/01/2015  . Left inguinal pain 07/10/2015  . GERD (gastroesophageal reflux disease) 03/14/2015  . Anxiety disorder 03/14/2015  . Hypercholesterolemia with hypertriglyceridemia 03/14/2015  . BP (high blood pressure) 03/14/2015  . Dermatitis, eczematoid 03/14/2015  . Headache, tension-type 03/14/2015  . Cervical pain 03/14/2015    Past Medical History:  Diagnosis Date  . Anxiety   . Broken shoulder   . Collar bone fracture    x 2  . Erectile dysfunction   . GERD (gastroesophageal reflux disease)   . H/O Bell's palsy   . Hyperlipidemia   . Hypertension   . Obesity   . Palpitations    a. Previously evaluted when he was incarcerated in [place] - reportedly nl stress test.  . PVC's (premature ventricular contractions)    a. 04/2013 Echo: EF 50-55%, Gr1 DD, mildly dil LA;  b. 04/2017 48hr holter: freq PVCs (5.4%), rare PACs. Single 13 beat atrial run - 143 bpm.    Past Surgical History:  Procedure Laterality Date  . CARPAL TUNNEL RELEASE Bilateral   . HERNIA REPAIR  2015  . HIP SURGERY     a. Age 32 - failure of growth plate to close - Pins inserted    Social History   Tobacco Use  . Smoking status: Former Smoker    Packs/day: 1.00    Years:  20.00    Pack years: 20.00    Types: Cigarettes    Last attempt to quit: 09/15/2015    Years since quitting: 3.0  . Smokeless tobacco: Never Used  Substance Use Topics  . Alcohol use: Yes    Alcohol/week: 0.0 standard drinks    Comment: 3-6 cans/beer on weeknights, 6-12 beers on weekends.     Current Outpatient Medications:  .  atorvastatin (LIPITOR) 20 MG tablet, Take 1 tablet (20 mg total) by mouth daily at 6 PM., Disp: 30 tablet, Rfl: 0 .  losartan (COZAAR) 50 MG tablet, Take 1.5 tablets (75 mg total) by mouth daily. Appointment needed please, Disp: 45 tablet, Rfl: 0 .  clonazePAM (KLONOPIN) 0.5 MG tablet, Take 1 tablet (0.5 mg total) by mouth 3 (three) times daily as needed for anxiety. (Patient not taking: Reported on 09/29/2018), Disp: 90 tablet, Rfl: 2 .  cyclobenzaprine (FLEXERIL) 10 MG tablet, Take 10 mg by mouth 3 (three) times daily as needed. for muscle spams, Disp: , Rfl:  .  doxycycline (VIBRA-TABS) 100 MG tablet, Take 1 tablet (100 mg total) by mouth 2 (two) times daily., Disp: 20 tablet, Rfl: 0 .  Icosapent Ethyl (VASCEPA) 1 g CAPS, Take 2 capsules (2 g total) by mouth 2 (two) times daily. (Patient not taking: Reported on 02/21/2018), Disp: 120 capsule, Rfl:  11 .  metoprolol tartrate (LOPRESSOR) 25 MG tablet, Take 1 tablet (25 mg total) by mouth 2 (two) times daily., Disp: 180 tablet, Rfl: 3 .  pantoprazole (PROTONIX) 40 MG tablet, Take 1 tablet (40 mg total) by mouth daily as needed. Caution:prolonged use may cause health problems; appt needed (Patient not taking: Reported on 09/29/2018), Disp: 30 tablet, Rfl: 0 .  promethazine-dextromethorphan (PROMETHAZINE-DM) 6.25-15 MG/5ML syrup, Take 2.5 mLs by mouth 4 (four) times daily as needed., Disp: 118 mL, Rfl: 0  No Known Allergies  ROS   No other specific complaints in a complete review of systems (except as listed in HPI above).  Objective  Vitals:   09/29/18 1107  BP: 126/80  Pulse: (!) 114  Resp: 18  Temp: 98.2 F  (36.8 C)  SpO2: 97%  Weight: 213 lb 11.2 oz (96.9 kg)  Height: 5' 9.5" (1.765 m)    Body mass index is 31.11 kg/m.  Nursing Note and Vital Signs reviewed.  Physical Exam Constitutional:      Appearance: Normal appearance.  HENT:     Head: Normocephalic and atraumatic.     Right Ear: Tympanic membrane, ear canal and external ear normal.     Left Ear: Tympanic membrane, ear canal and external ear normal.     Nose: No congestion.     Mouth/Throat:     Mouth: Mucous membranes are moist.     Pharynx: Posterior oropharyngeal erythema present. No oropharyngeal exudate.  Eyes:     Conjunctiva/sclera: Conjunctivae normal.     Pupils: Pupils are equal, round, and reactive to light.  Cardiovascular:     Rate and Rhythm: Regular rhythm. Tachycardia present.     Pulses: Normal pulses.  Pulmonary:     Effort: Pulmonary effort is normal.     Breath sounds: Examination of the right-upper field reveals decreased breath sounds. Examination of the left-upper field reveals decreased breath sounds. Examination of the right-middle field reveals decreased breath sounds. Examination of the left-middle field reveals decreased breath sounds. Examination of the right-lower field reveals decreased breath sounds. Examination of the left-lower field reveals decreased breath sounds. Decreased breath sounds and rhonchi (right upper lobe) present.  Musculoskeletal: Normal range of motion.  Lymphadenopathy:     Cervical: No cervical adenopathy.  Skin:    General: Skin is warm and dry.     Findings: No rash.  Neurological:     General: No focal deficit present.     Mental Status: He is alert and oriented to person, place, and time.  Psychiatric:        Mood and Affect: Mood normal.        Thought Content: Thought content normal.       No results found for this or any previous visit (from the past 48 hour(s)).  Assessment & Plan  1. Shortness of breath Likely pneumonia, will treat with fevers,  adventitious breath sounds, shortness of breath and no improvement of URI symptoms. Follow-up in 2-3 weeks for re-evaluation and recheck lipids. Discussed ER precautions.  - doxycycline (VIBRA-TABS) 100 MG tablet; Take 1 tablet (100 mg total) by mouth 2 (two) times daily.  Dispense: 20 tablet; Refill: 0 - DG Chest 2 View; Future  2. Cough - doxycycline (VIBRA-TABS) 100 MG tablet; Take 1 tablet (100 mg total) by mouth 2 (two) times daily.  Dispense: 20 tablet; Refill: 0 - DG Chest 2 View; Future - promethazine-dextromethorphan (PROMETHAZINE-DM) 6.25-15 MG/5ML syrup; Take 2.5 mLs by mouth 4 (four) times daily as  needed.  Dispense: 118 mL; Refill: 0   -Red flags and when to present for emergency care or RTC including fever >101.59F, chest pain, shortness of breath, new/worsening/un-resolving symptoms, reviewed with patient at time of visit. Follow up and care instructions discussed and provided in AVS.

## 2018-09-29 NOTE — Patient Instructions (Signed)
Please take your antibiotic as prescribed with food on your stomach to prevent nausea and upset stomach. Take the antibiotic for the entire course, even if your symptoms resolve before the course if completed. Using antibiotics inappropriately can make it harder to treat future infections. If you have any concerning side effects please stop the medication and let us know immediately. I do recommend taking a probiotic to replenish your good gut health anytime you take an antibiotic. You can get probiotics in types of yogurt like activia, or other food/drinks such as kimchi, kombucha , sauerkraut, or you can take an over the counter supplement.

## 2018-10-13 ENCOUNTER — Encounter: Payer: Self-pay | Admitting: Family Medicine

## 2018-10-13 ENCOUNTER — Ambulatory Visit: Payer: Managed Care, Other (non HMO) | Admitting: Family Medicine

## 2018-10-13 VITALS — BP 124/78 | HR 99 | Temp 97.6°F | Resp 12 | Ht 70.0 in | Wt 209.0 lb

## 2018-10-13 DIAGNOSIS — E559 Vitamin D deficiency, unspecified: Secondary | ICD-10-CM | POA: Diagnosis not present

## 2018-10-13 DIAGNOSIS — Z5181 Encounter for therapeutic drug level monitoring: Secondary | ICD-10-CM

## 2018-10-13 DIAGNOSIS — E782 Mixed hyperlipidemia: Secondary | ICD-10-CM

## 2018-10-13 DIAGNOSIS — I10 Essential (primary) hypertension: Secondary | ICD-10-CM | POA: Diagnosis not present

## 2018-10-13 DIAGNOSIS — K219 Gastro-esophageal reflux disease without esophagitis: Secondary | ICD-10-CM

## 2018-10-13 DIAGNOSIS — G4700 Insomnia, unspecified: Secondary | ICD-10-CM

## 2018-10-13 DIAGNOSIS — R1012 Left upper quadrant pain: Secondary | ICD-10-CM

## 2018-10-13 MED ORDER — METOPROLOL TARTRATE 25 MG PO TABS: 25.0000 mg | ORAL_TABLET | Freq: Two times a day (BID) | ORAL | 1 refills | Status: DC

## 2018-10-13 MED ORDER — LOSARTAN POTASSIUM 50 MG PO TABS: 50.0000 mg | ORAL_TABLET | Freq: Every day | ORAL | 0 refills | Status: DC

## 2018-10-13 MED ORDER — ATORVASTATIN CALCIUM 20 MG PO TABS: 20.0000 mg | ORAL_TABLET | Freq: Every day | ORAL | 0 refills | Status: DC

## 2018-10-13 MED ORDER — PANTOPRAZOLE SODIUM 40 MG PO TBEC: 40.0000 mg | DELAYED_RELEASE_TABLET | Freq: Every day | ORAL | 1 refills | Status: DC | PRN

## 2018-10-13 NOTE — Progress Notes (Signed)
BP 124/78   Pulse 99   Temp 97.6 F (36.4 C) (Oral)   Resp 12   Ht 5\' 10"  (1.778 m)   Wt 209 lb (94.8 kg)   SpO2 99%   BMI 29.99 kg/m    Subjective:    Patient ID: David Carlson, male    DOB: [birth year], 48 y.o.   MRN: [id number]  HPI: David Carlson is a 48 y.o. male  Chief Complaint  Patient presents with  . Follow-up    2 weeks from [place] still has cough  . Medication Refill    HPI He was sick on Dec 15th; went to [facility], acute visit then, cough then [employer] 2 weeks ago, close to pneumonia, antibiotics; no recent fevers Still coughing some, wet sensation in the upper chest when laying down Chest xray show no active CP disease No travel He does not smoke; used to smoke and vape and quit all of that  Hypertension Controlled today  High cholesterol; he has been out of cholesterol medicine He is fasting; last night, he had subway  GERD; bad; no blood in the stool; he was supposed to have the scope, but that was several years ago; takes the medicine and it's fine; nontender to the touch; any food can bother him without medicine; he has done Prilosec OTC  He asked for something for sleep; he is not sleeping well; drinks some alcohol; dozes off for sleep; after days of going through poor sleep; then he'll sleep  Lab Results  Component Value Date   CHOL 174 10/13/2018   HDL 35 (L) 10/13/2018   LDLCALC 100 (H) 10/13/2018   TRIG 273 (H) 10/13/2018   CHOLHDL 5.0 (H) 10/13/2018     Depression screen [facility] 2/9 09/29/2018 02/21/2018 12/20/2017 09/21/2017 05/19/2017  Decreased Interest 0 0 0 0 0  Down, Depressed, Hopeless 0 0 0 0 0  PHQ - 2 Score 0 0 0 0 0  Altered sleeping 0 - - - -  Tired, decreased energy 0 - - - -  Change in appetite 0 - - - -  Feeling bad or failure about yourself  0 - - - -  Trouble concentrating 0 - - - -  Moving slowly or fidgety/restless 0 - - - -  Suicidal thoughts 0 - - - -  PHQ-9 Score 0 - - - -   Fall Risk   09/29/2018 02/21/2018 12/20/2017 09/21/2017 05/19/2017  Falls in the past year? 0 No No No No    Relevant past medical, surgical, family and social history reviewed Past Medical History:  Diagnosis Date  . Anxiety   . Broken shoulder   . Collar bone fracture    x 2  . Erectile dysfunction   . GERD (gastroesophageal reflux disease)   . H/O Bell's palsy   . Hyperlipidemia   . Hypertension   . Obesity   . Palpitations    a. Previously evaluted when he was incarcerated in [place] - reportedly nl stress test.  . PVC's (premature ventricular contractions)    a. 04/2013 Echo: EF 50-55%, Gr1 DD, mildly dil LA;  b. 04/2017 48hr holter: freq PVCs (5.4%), rare PACs. Single 13 beat atrial run - 143 bpm.   Past Surgical History:  Procedure Laterality Date  . CARPAL TUNNEL RELEASE Bilateral   . HERNIA REPAIR  2015  . HIP SURGERY     a. Age 37 - failure of growth plate to close - Pins inserted  Family History  Problem Relation Age of Onset  . Diabetes Mother   . Hypertension Father   . Heart disease Maternal Grandmother   . Heart attack Maternal Grandmother   . Diabetes Maternal Grandfather   . Diabetes Paternal Grandfather    Social History   Tobacco Use  . Smoking status: Former Smoker    Packs/day: 1.00    Years: 20.00    Pack years: 20.00    Types: Cigarettes    Last attempt to quit: 09/15/2015    Years since quitting: 3.1  . Smokeless tobacco: Never Used  Substance Use Topics  . Alcohol use: Yes    Alcohol/week: 0.0 standard drinks    Comment: 3-6 cans/beer on weeknights, 6-12 beers on weekends.  . Drug use: No     Office Visit from 09/29/2018 in [facility]  AUDIT-C Score  7      Interim medical history since last visit reviewed. Allergies and medications reviewed  Review of Systems Per HPI unless specifically indicated above     Objective:    BP 124/78   Pulse 99   Temp 97.6 F (36.4 C) (Oral)   Resp 12   Ht 5\' 10"  (1.778 m)   Wt 209 lb (94.8 kg)    SpO2 99%   BMI 29.99 kg/m   Wt Readings from Last 3 Encounters:  10/13/18 209 lb (94.8 kg)  09/29/18 213 lb 11.2 oz (96.9 kg)  02/21/18 204 lb 4.8 oz (92.7 kg)    Physical Exam Constitutional:      General: He is not in acute distress.    Appearance: He is well-developed.     Comments: overweight  HENT:     Head: Normocephalic and atraumatic.  Eyes:     General: No scleral icterus. Neck:     Thyroid: No thyromegaly.  Cardiovascular:     Rate and Rhythm: Normal rate and regular rhythm.  Pulmonary:     Effort: Pulmonary effort is normal.     Breath sounds: Normal breath sounds.  Abdominal:     General: Bowel sounds are normal. There is no distension.     Palpations: Abdomen is soft.  Skin:    General: Skin is warm and dry.     Coloration: Skin is not pale.  Neurological:     Mental Status: He is alert.     Coordination: Coordination normal.  Psychiatric:        Behavior: Behavior normal.        Thought Content: Thought content normal.        Judgment: Judgment normal.        Assessment & Plan:   Problem List Items Addressed This Visit      Cardiovascular and Mediastinum   BP (high blood pressure) (Chronic)   Relevant Medications   losartan (COZAAR) 50 MG tablet   metoprolol tartrate (LOPRESSOR) 25 MG tablet   atorvastatin (LIPITOR) 20 MG tablet     Digestive   GERD (gastroesophageal reflux disease) - Primary (Chronic)    Check H pylor, cbc to r/o anemia which could be secondary to GI bleeding; refer to GI      Relevant Medications   pantoprazole (PROTONIX) 40 MG tablet   Other Relevant Orders   Ambulatory referral to Gastroenterology     Other   Vitamin D deficiency   Relevant Orders   VITAMIN D 25 Hydroxy (Vit-D Deficiency, Fractures) (Completed)   Medication monitoring encounter    Check cbc and cmp  Relevant Orders   CBC (Completed)   COMPLETE METABOLIC PANEL WITH GFR (Completed)   Hypercholesterolemia with hypertriglyceridemia     Encouraged healthier eating; check lipids today; statin to help lower LDL      Relevant Medications   losartan (COZAAR) 50 MG tablet   metoprolol tartrate (LOPRESSOR) 25 MG tablet   atorvastatin (LIPITOR) 20 MG tablet   Other Relevant Orders   Lipid panel (Completed)    Other Visit Diagnoses    Left upper quadrant pain       Relevant Orders   H. pylori breath test (Completed)   Ambulatory referral to Gastroenterology   Insomnia, unspecified type       explained I don't recommend a sleeping pill; recommended CBT-insomnia; patient declined       Follow up plan: Return in about 2 weeks (around 10/27/2018) for blood pressure recheck with CMA only.  An after-visit summary was printed and given to the patient at [place].  Please see the patient instructions which may contain other information and recommendations beyond what is mentioned above in the assessment and plan.  Meds ordered this encounter  Medications  . pantoprazole (PROTONIX) 40 MG tablet    Sig: Take 1 tablet (40 mg total) by mouth daily as needed. Caution:prolonged use may cause health problems; appt needed    Dispense:  90 tablet    Refill:  1    Please consider 90 day supplies to promote better adherence  . losartan (COZAAR) 50 MG tablet    Sig: Take 1 tablet (50 mg total) by mouth daily.    Dispense:  90 tablet    Refill:  0  . metoprolol tartrate (LOPRESSOR) 25 MG tablet    Sig: Take 1 tablet (25 mg total) by mouth 2 (two) times daily.    Dispense:  180 tablet    Refill:  1  . atorvastatin (LIPITOR) 20 MG tablet    Sig: Take 1 tablet (20 mg total) by mouth daily at 6 PM.    Dispense:  90 tablet    Refill:  0    Please provide 30-day supplies instead of 90. Thank you!    Orders Placed This Encounter  Procedures  . H. pylori breath test  . CBC  . COMPLETE METABOLIC PANEL WITH GFR  . Lipid panel  . VITAMIN D 25 Hydroxy (Vit-D Deficiency, Fractures)  . Ambulatory referral to Gastroenterology

## 2018-10-13 NOTE — Patient Instructions (Addendum)
Caution: prolonged use of proton pump inhibitors like omeprazole (Prilosec), pantoprazole (Protonix), esomeprazole (Nexium), and others like Dexilant and Aciphex may increase your risk of pneumonia, Clostridium difficile colitis, osteoporosis, anemia and other health complications Try to limit or avoid triggers like coffee, caffeinated beverages, onions, chocolate, spicy foods, peppermint, acidic foods like pizza, spaghetti sauce, and orange juice Lose weight if you are overweight or obese Try elevating the head of your bed by placing a small wedge between your mattress and box springs to keep acid in the stomach at night instead of coming up into your esophagus  We'll get the labs today We'll have you see the gastroenterologist If you develop serious pain or blood in the stool, go to the Emergency Department   Check out the information at familydoctor.org entitled "Nutrition for Weight Loss: What You Need to Know about Fad Diets" Try to lose between 0.5 to 1 pound per week by taking in fewer calories and burning off more calories You can succeed by limiting portions, limiting foods dense in calories and fat, becoming more active, and drinking 8 glasses of water a day (64 ounces) Don't skip meals, especially breakfast, as skipping meals may alter your metabolism Do not use over-the-counter weight loss pills or gimmicks that claim rapid weight loss A healthy BMI (or body mass index) is between 18.5 and 24.9 You can calculate your ideal BMI at the [place] website ClubMonetize.fr  Preventing Unhealthy Weight Gain, Adult Staying at a healthy weight is important to your overall health. When fat builds up in your body, you may become overweight or obese. Being overweight or obese increases your risk of developing certain health problems, such as heart disease, diabetes, sleeping problems, joint problems, and some types of cancer. Unhealthy weight gain is  often the result of making unhealthy food choices or not getting enough exercise. You can make changes to your lifestyle to prevent obesity and stay as healthy as possible. What nutrition changes can be made?   Eat only as much as your body needs. To do this: ? Pay attention to signs that you are hungry or full. Stop eating as soon as you feel full. ? If you feel hungry, try drinking water first before eating. Drink enough water so your urine is clear or pale yellow. ? Eat smaller portions. Pay attention to portion sizes when eating out. ? Look at serving sizes on food labels. Most foods contain more than one serving per container. ? Eat the recommended number of calories for your gender and activity level. For most active people, a daily total of 2,000 calories is appropriate. If you are trying to lose weight or are not very active, you may need to eat fewer calories. Talk with your health care provider or a diet and nutrition specialist (dietitian) about how many calories you need each day.  Choose healthy foods, such as: ? Fruits and vegetables. At each meal, try to fill at least half of your plate with fruits and vegetables. ? Whole grains, such as whole-wheat bread, brown rice, and quinoa. ? Lean meats, such as chicken or fish. ? Other healthy proteins, such as beans, eggs, or tofu. ? Healthy fats, such as nuts, seeds, fatty fish, and olive oil. ? Low-fat or fat-free dairy products.  Check food labels, and avoid food and drinks that: ? Are high in calories. ? Have added sugar. ? Are high in sodium. ? Have saturated fats or trans fats.  Cook foods in healthier ways, such as by  baking, broiling, or grilling.  Make a meal plan for the week, and shop with a grocery list to help you stay on track with your purchases. Try to avoid going to the grocery store when you are hungry.  When grocery shopping, try to shop around the outside of the store first, where the fresh foods are. Doing this  helps you to avoid prepackaged foods, which can be high in sugar, salt (sodium), and fat. What lifestyle changes can be made?   Exercise for 30 or more minutes on 5 or more days each week. Exercising may include brisk walking, yard work, biking, running, swimming, and team sports like basketball and soccer. Ask your health care provider which exercises are safe for you.  Do muscle-strengthening activities, such as lifting weights or using resistance bands, on 2 or more days a week.  Do not use any products that contain nicotine or tobacco, such as cigarettes and e-cigarettes. If you need help quitting, ask your health care provider.  Limit alcohol intake to no more than 1 drink a day for nonpregnant women and 2 drinks a day for men. One drink equals 12 oz of beer, 5 oz of wine, or 1 oz of hard liquor.  Try to get 7-9 hours of sleep each night. What other changes can be made?  Keep a food and activity journal to keep track of: ? What you ate and how many calories you had. Remember to count the calories in sauces, dressings, and side dishes. ? Whether you were active, and what exercises you did. ? Your calorie, weight, and activity goals.  Check your weight regularly. Track any changes. If you notice you have gained weight, make changes to your diet or activity routine.  Avoid taking weight-loss medicines or supplements. Talk to your health care provider before starting any new medicine or supplement.  Talk to your health care provider before trying any new diet or exercise plan. Why are these changes important? Eating healthy, staying active, and having healthy habits can help you to prevent obesity. Those changes also:  Help you manage stress and emotions.  Help you connect with friends and family.  Improve your self-esteem.  Improve your sleep.  Prevent long-term health problems. What can happen if changes are not made? Being obese or overweight can cause you to develop joint  or bone problems, which can make it hard for you to stay active or do activities you enjoy. Being obese or overweight also puts stress on your heart and lungs and can lead to health problems like diabetes, heart disease, and some cancers. Where to find more information Talk with your health care provider or a dietitian about healthy eating and healthy lifestyle choices. You may also find information from:  U.S. Department of Agriculture, MyPlate: FormerBoss.no  American Heart Association: www.heart.org  Centers for Disease Control and Prevention: http://www.wolf.info/ Summary  Staying at a healthy weight is important to your overall health. It helps you to prevent certain diseases and health problems, such as heart disease, diabetes, joint problems, sleep disorders, and some types of cancer.  Being obese or overweight can cause you to develop joint or bone problems, which can make it hard for you to stay active or do activities you enjoy.  You can prevent unhealthy weight gain by eating a healthy diet, exercising regularly, not smoking, limiting alcohol, and getting enough sleep.  Talk with your health care provider or a dietitian for guidance about healthy eating and healthy lifestyle choices. This  information is not intended to replace advice given to you by your health care provider. Make sure you discuss any questions you have with your health care provider. Document Released: 08/31/2016 Document Revised: 06/10/2017 Document Reviewed: 10/06/2016 Elsevier Interactive Patient Education  2019 [place].  Insomnia Insomnia is a sleep disorder that makes it difficult to fall asleep or stay asleep. Insomnia can cause fatigue, low energy, difficulty concentrating, mood swings, and poor performance at work or school. There are three different ways to classify insomnia:  Difficulty falling asleep.  Difficulty staying asleep.  Waking up too early in the morning. Any type of insomnia can be  long-term (chronic) or short-term (acute). Both are common. Short-term insomnia usually lasts for three months or less. Chronic insomnia occurs at least three times a week for longer than three months. What are the causes? Insomnia may be caused by another condition, situation, or substance, such as:  Anxiety.  Certain medicines.  Gastroesophageal reflux disease (GERD) or other gastrointestinal conditions.  Asthma or other breathing conditions.  Restless legs syndrome, sleep apnea, or other sleep disorders.  Chronic pain.  Menopause.  Stroke.  Abuse of alcohol, tobacco, or illegal drugs.  Mental health conditions, such as depression.  Caffeine.  Neurological disorders, such as Alzheimer's disease.  An overactive thyroid (hyperthyroidism). Sometimes, the cause of insomnia may not be known. What increases the risk? Risk factors for insomnia include:  Gender. Women are affected more often than men.  Age. Insomnia is more common as you get older.  Stress.  Lack of exercise.  Irregular work schedule or working night shifts.  Traveling between different time zones.  Certain medical and mental health conditions. What are the signs or symptoms? If you have insomnia, the main symptom is having trouble falling asleep or having trouble staying asleep. This may lead to other symptoms, such as:  Feeling fatigued or having low energy.  Feeling nervous about going to sleep.  Not feeling rested in the morning.  Having trouble concentrating.  Feeling irritable, anxious, or depressed. How is this diagnosed? This condition may be diagnosed based on:  Your symptoms and medical history. Your health care provider may ask about: ? Your sleep habits. ? Any medical conditions you have. ? Your mental health.  A physical exam. How is this treated? Treatment for insomnia depends on the cause. Treatment may focus on treating an underlying condition that is causing insomnia.  Treatment may also include:  Medicines to help you sleep.  Counseling or therapy.  Lifestyle adjustments to help you sleep better. Follow these instructions at home: Eating and drinking   Limit or avoid alcohol, caffeinated beverages, and cigarettes, especially close to bedtime. These can disrupt your sleep.  Do not eat a large meal or eat spicy foods right before bedtime. This can lead to digestive discomfort that can make it hard for you to sleep. Sleep habits   Keep a sleep diary to help you and your health care provider figure out what could be causing your insomnia. Write down: ? When you sleep. ? When you wake up during the night. ? How well you sleep. ? How rested you feel the next day. ? Any side effects of medicines you are taking. ? What you eat and drink.  Make your bedroom a dark, comfortable place where it is easy to fall asleep. ? Put up shades or blackout curtains to block light from outside. ? Use a white noise machine to block noise. ? Keep the temperature cool.  Limit screen use before bedtime. This includes: ? Watching TV. ? Using your smartphone, tablet, or computer.  Stick to a routine that includes going to bed and waking up at the same times every day and night. This can help you fall asleep faster. Consider making a quiet activity, such as reading, part of your nighttime routine.  Try to avoid taking naps during the day so that you sleep better at night.  Get out of bed if you are still awake after 15 minutes of trying to sleep. Keep the lights down, but try reading or doing a quiet activity. When you feel sleepy, go back to bed. General instructions  Take over-the-counter and prescription medicines only as told by your health care provider.  Exercise regularly, as told by your health care provider. Avoid exercise starting several hours before bedtime.  Use relaxation techniques to manage stress. Ask your health care provider to suggest some  techniques that may work well for you. These may include: ? Breathing exercises. ? Routines to release muscle tension. ? Visualizing peaceful scenes.  Make sure that you drive carefully. Avoid driving if you feel very sleepy.  Keep all follow-up visits as told by your health care provider. This is important. Contact a health care provider if:  You are tired throughout the day.  You have trouble in your daily routine due to sleepiness.  You continue to have sleep problems, or your sleep problems get worse. Get help right away if:  You have serious thoughts about hurting yourself or someone else. If you ever feel like you may hurt yourself or others, or have thoughts about taking your own life, get help right away. You can go to your nearest emergency department or call:  Your local emergency services (911 in the [place]  A suicide crisis helpline, such as the [facility] at 315-451-9277. This is open 24 hours a day. Summary  Insomnia is a sleep disorder that makes it difficult to fall asleep or stay asleep.  Insomnia can be long-term (chronic) or short-term (acute).  Treatment for insomnia depends on the cause. Treatment may focus on treating an underlying condition that is causing insomnia.  Keep a sleep diary to help you and your health care provider figure out what could be causing your insomnia. This information is not intended to replace advice given to you by your health care provider. Make sure you discuss any questions you have with your health care provider. Document Released: 08/27/2000 Document Revised: 06/09/2017 Document Reviewed: 06/09/2017 Elsevier Interactive Patient Education  2019 [employer].

## 2018-10-16 ENCOUNTER — Other Ambulatory Visit: Payer: Self-pay | Admitting: Family Medicine

## 2018-10-16 LAB — CBC
HCT: 40 % (ref 38.5–50.0)
Hemoglobin: 13.7 g/dL (ref 13.2–17.1)
MCH: 31.6 pg (ref 27.0–33.0)
MCHC: 34.3 g/dL (ref 32.0–36.0)
MCV: 92.4 fL (ref 80.0–100.0)
MPV: 11 fL (ref 7.5–12.5)
Platelets: 239 10*3/uL (ref 140–400)
RBC: 4.33 Million/uL (ref 4.20–5.80)
RDW: 11.9 % (ref 11.0–15.0)
WBC: 5.5 10*3/uL (ref 3.8–10.8)

## 2018-10-16 LAB — COMPLETE METABOLIC PANEL WITHOUT GFR
AG Ratio: 1.7 (calc) (ref 1.0–2.5)
ALT: 25 U/L (ref 9–46)
AST: 26 U/L (ref 10–40)
Albumin: 4 g/dL (ref 3.6–5.1)
Alkaline phosphatase (APISO): 47 U/L (ref 40–115)
BUN: 19 mg/dL (ref 7–25)
CO2: 25 mmol/L (ref 20–32)
Calcium: 9 mg/dL (ref 8.6–10.3)
Chloride: 106 mmol/L (ref 98–110)
Creat: 0.95 mg/dL (ref 0.60–1.35)
GFR, Est African American: 111 mL/min/{1.73_m2}
GFR, Est Non African American: 96 mL/min/{1.73_m2}
Globulin: 2.4 g/dL (ref 1.9–3.7)
Glucose, Bld: 83 mg/dL (ref 65–99)
Potassium: 4.1 mmol/L (ref 3.5–5.3)
Sodium: 139 mmol/L (ref 135–146)
Total Bilirubin: 1.1 mg/dL (ref 0.2–1.2)
Total Protein: 6.4 g/dL (ref 6.1–8.1)

## 2018-10-16 LAB — LIPID PANEL
Cholesterol: 174 mg/dL
HDL: 35 mg/dL — ABNORMAL LOW
LDL Cholesterol (Calc): 100 mg/dL — ABNORMAL HIGH
Non-HDL Cholesterol (Calc): 139 mg/dL — ABNORMAL HIGH
Total CHOL/HDL Ratio: 5 (calc) — ABNORMAL HIGH
Triglycerides: 273 mg/dL — ABNORMAL HIGH

## 2018-10-16 LAB — VITAMIN D 25 HYDROXY (VIT D DEFICIENCY, FRACTURES): Vit D, 25-Hydroxy: 19 ng/mL — ABNORMAL LOW (ref 30–100)

## 2018-10-16 LAB — H. PYLORI BREATH TEST: H. pylori Breath Test: NOT DETECTED

## 2018-10-16 MED ORDER — VITAMIN D (ERGOCALCIFEROL) 1.25 MG (50000 UNIT) PO CAPS: 50000.0000 [IU] | ORAL_CAPSULE | ORAL | 0 refills | Status: DC

## 2018-10-22 NOTE — Assessment & Plan Note (Signed)
Check cbc and cmp

## 2018-10-22 NOTE — Assessment & Plan Note (Signed)
Encouraged healthier eating; check lipids today; statin to help lower LDL

## 2018-10-22 NOTE — Assessment & Plan Note (Signed)
Check H pylor, cbc to r/o anemia which could be secondary to GI bleeding; refer to GI

## 2018-10-27 ENCOUNTER — Ambulatory Visit: Payer: Managed Care, Other (non HMO)

## 2018-11-06 ENCOUNTER — Telehealth: Payer: Self-pay

## 2018-11-06 ENCOUNTER — Ambulatory Visit (INDEPENDENT_AMBULATORY_CARE_PROVIDER_SITE_OTHER): Payer: Managed Care, Other (non HMO) | Admitting: Gastroenterology

## 2018-11-06 ENCOUNTER — Other Ambulatory Visit: Payer: Self-pay

## 2018-11-06 ENCOUNTER — Encounter: Payer: Self-pay | Admitting: Gastroenterology

## 2018-11-06 VITALS — BP 141/91 | HR 99 | Ht 70.0 in | Wt 209.2 lb

## 2018-11-06 DIAGNOSIS — K219 Gastro-esophageal reflux disease without esophagitis: Secondary | ICD-10-CM | POA: Diagnosis not present

## 2018-11-06 NOTE — Telephone Encounter (Signed)
Spoke with pt and informed him that Dr. Vicente Males requires pt to have cardiac clearance prior to the endoscopy procedure. Pt states he'll contact our office once he decides to follow up with his cardiologist, as he has not seen cardiology since 2018.

## 2018-11-06 NOTE — Progress Notes (Signed)
Jonathon Bellows MD, MRCP(U.K) [address]  [place], [place] [address]  Main: [phone]  Fax: [phone]   Primary Care Physician: Arnetha Courser, MD  Primary Gastroenterologist:  Dr. Jonathon Bellows   Chief Complaint  Patient presents with  . Gastroesophageal Reflux    HPI: David Carlson is a 47 y.o. male I have previously seen in July 2018 for acid reflux.  He has had acid reflux over 20 years described as abdominal discomfort and heartburn.  He is an ex-smoker and smoked for 20 years.  At that point of timcame for his procedure- had issues with tachycardia and bradycardia - had cardiac work up and is on a beta blocker   Today he has been referred for acid reflux and left upper quadrant pain.   Since his last - has no symptoms when he takes his PPI , when he goes off it has severe symptoms.     Current Outpatient Medications  Medication Sig Dispense Refill  . atorvastatin (LIPITOR) 20 MG tablet Take 1 tablet (20 mg total) by mouth daily at 6 PM. 90 tablet 0  . losartan (COZAAR) 50 MG tablet Take 1 tablet (50 mg total) by mouth daily. 90 tablet 0  . metoprolol tartrate (LOPRESSOR) 25 MG tablet Take 1 tablet (25 mg total) by mouth 2 (two) times daily. 180 tablet 1  . pantoprazole (PROTONIX) 40 MG tablet Take 1 tablet (40 mg total) by mouth daily as needed. Caution:prolonged use may cause health problems; appt needed 90 tablet 1  . Vitamin D, Ergocalciferol, (DRISDOL) 1.25 MG (50000 UT) CAPS capsule Take 1 capsule (50,000 Units total) by mouth every 7 (seven) days. 4 capsule 0  . cyclobenzaprine (FLEXERIL) 10 MG tablet Take 10 mg by mouth 3 (three) times daily as needed. for muscle spams    . Icosapent Ethyl (VASCEPA) 1 g CAPS Take 2 capsules (2 g total) by mouth 2 (two) times daily. (Patient not taking: Reported on 02/21/2018) 120 capsule 11  . promethazine-dextromethorphan (PROMETHAZINE-DM) 6.25-15 MG/5ML syrup Take 2.5 mLs by mouth 4 (four) times daily as  needed. (Patient not taking: Reported on 11/06/2018) 118 mL 0   No current facility-administered medications for this visit.     Allergies as of 11/06/2018  . (No Known Allergies)    ROS:  General: Negative for anorexia, weight loss, fever, chills, fatigue, weakness. ENT: Negative for hoarseness, difficulty swallowing , nasal congestion. CV: Negative for chest pain, angina, palpitations, dyspnea on exertion, peripheral edema.  Respiratory: Negative for dyspnea at rest, dyspnea on exertion, cough, sputum, wheezing.  GI: See history of present illness. GU:  Negative for dysuria, hematuria, urinary incontinence, urinary frequency, nocturnal urination.  Endo: Negative for unusual weight change.    Physical Examination:   BP (!) 141/91   Pulse 99   Ht 5\' 10"  (1.778 m)   Wt 209 lb 3.2 oz (94.9 kg)   BMI 30.02 kg/m   General: Well-nourished, well-developed in no acute distress.  Eyes: No icterus. Conjunctivae pink. Mouth: Oropharyngeal mucosa moist and pink , no lesions erythema or exudate. Lungs: Clear to auscultation bilaterally. Non-labored. Heart: Regular rate and rhythm, no murmurs rubs or gallops.  Abdomen: Bowel sounds are normal, nontender, nondistended, no hepatosplenomegaly or masses, no abdominal bruits or hernia , no rebound or guarding.   Extremities: No lower extremity edema. No clubbing or deformities. Neuro: Alert and oriented x 3.  Grossly intact. Skin: Warm and dry, no jaundice.   Psych:  Alert and cooperative, normal mood and affect.   Imaging Studies: No results found.  Assessment and Plan:   David Carlson is a 48 y.o. y/o male here to follow up for GERD.   Plan  1. EGD to screen for Barrettes esophagus 2. Continue PPI- suggest to decrease dose to 20 mg daily protonix  3. GERD patient information .    I have discussed alternative options, risks & benefits,  which include, but are not limited to, bleeding, infection, perforation,respiratory  complication & drug reaction.  The patient agrees with this plan & written consent will be obtained.       Dr Jonathon Bellows  MD,MRCP [facility]) Follow up in 6 weeks

## 2018-12-18 ENCOUNTER — Ambulatory Visit: Payer: Managed Care, Other (non HMO) | Admitting: Gastroenterology

## 2019-01-30 ENCOUNTER — Ambulatory Visit (INDEPENDENT_AMBULATORY_CARE_PROVIDER_SITE_OTHER): Payer: Managed Care, Other (non HMO) | Admitting: Family Medicine

## 2019-01-30 ENCOUNTER — Encounter: Payer: Self-pay | Admitting: Family Medicine

## 2019-01-30 VITALS — Temp 97.8°F | Wt 200.0 lb

## 2019-01-30 DIAGNOSIS — K219 Gastro-esophageal reflux disease without esophagitis: Secondary | ICD-10-CM | POA: Diagnosis not present

## 2019-01-30 DIAGNOSIS — R0602 Shortness of breath: Secondary | ICD-10-CM

## 2019-01-30 DIAGNOSIS — R1013 Epigastric pain: Secondary | ICD-10-CM

## 2019-01-30 DIAGNOSIS — J0100 Acute maxillary sinusitis, unspecified: Secondary | ICD-10-CM

## 2019-01-30 MED ORDER — FEXOFENADINE HCL 180 MG PO TABS: 180.0000 mg | ORAL_TABLET | Freq: Every day | ORAL | 1 refills | Status: DC

## 2019-01-30 MED ORDER — DOXYCYCLINE HYCLATE 100 MG PO TABS: 100.0000 mg | ORAL_TABLET | Freq: Two times a day (BID) | ORAL | 0 refills | Status: AC

## 2019-01-30 MED ORDER — ALBUTEROL SULFATE HFA 108 (90 BASE) MCG/ACT IN AERS: 2.0000 | INHALATION_SPRAY | Freq: Four times a day (QID) | RESPIRATORY_TRACT | 0 refills | Status: AC | PRN

## 2019-01-30 MED ORDER — FLUTICASONE PROPIONATE 50 MCG/ACT NA SUSP: 2.0000 | Freq: Every day | NASAL | 6 refills | Status: AC

## 2019-01-30 NOTE — Progress Notes (Signed)
Name: David Carlson   MRN: [id number]    DOB: 1971/08/13   Date:01/30/2019       Progress Note  Subjective  Chief Complaint  Chief Complaint  Patient presents with  . URI    onset months SOB, head feels like its going to pop and stopped up and cough    I connected with  Glean Salvo II  on 01/30/19 at 10:20 AM EDT by a video enabled telemedicine application and verified that I am speaking with the correct person using two identifiers.  I discussed the limitations of evaluation and management by telemedicine and the availability of in person appointments. The patient expressed understanding and agreed to proceed. Staff also discussed with the patient that there may be a patient responsible charge related to this service. Patient Location: home Provider Location: Home Additional Individuals present: None  HPI  Pt presents with concern for ongoing illness for a few months off and on.  Recently he has been getting dizzy, having significant headache, does get some pressure in the epigastric area.  Having trouble breathing when he lays down - lungs feel a bit tight, but the shortness of breath is mostly from his nasal congestion.  Tear ducts are congested and crust some mornings. No fevers.  Does have some chills and sweats. He is not taking any antihistamine or nasal sprays.  No vision changes, chest pain.  Abdominal Pain: Epigastric, better on empty stomach.  He does have GERD and sees Dr. Vicente Males - states this current pain is much different.  He denies  vomiting, diarrhea, constipation, blood in stool.  Endorses nausea when he has a coughing fit.  Patient Active Problem List   Diagnosis Date Noted  . Medication monitoring encounter 12/20/2017  . Annual physical exam 10/15/2016  . Vitamin D deficiency 01/13/2016  . Erectile dysfunction 01/13/2016  . Encounter for annual health examination 10/01/2015  . Fatigue 10/01/2015  . Left inguinal pain 07/10/2015  . GERD  (gastroesophageal reflux disease) 03/14/2015  . Anxiety disorder 03/14/2015  . Hypercholesterolemia with hypertriglyceridemia 03/14/2015  . BP (high blood pressure) 03/14/2015  . Dermatitis, eczematoid 03/14/2015  . Headache, tension-type 03/14/2015  . Cervical pain 03/14/2015    Social History   Tobacco Use  . Smoking status: Former Smoker    Packs/day: 1.00    Years: 20.00    Pack years: 20.00    Types: Cigarettes    Last attempt to quit: 09/15/2015    Years since quitting: 3.3  . Smokeless tobacco: Never Used  Substance Use Topics  . Alcohol use: Yes    Alcohol/week: 0.0 standard drinks    Comment: 3-6 cans/beer on weeknights, 6-12 beers on weekends.     Current Outpatient Medications:  .  atorvastatin (LIPITOR) 20 MG tablet, Take 1 tablet (20 mg total) by mouth daily at 6 PM., Disp: 90 tablet, Rfl: 0 .  losartan (COZAAR) 50 MG tablet, Take 1 tablet (50 mg total) by mouth daily., Disp: 90 tablet, Rfl: 0 .  pantoprazole (PROTONIX) 40 MG tablet, Take 1 tablet (40 mg total) by mouth daily as needed. Caution:prolonged use may cause health problems; appt needed, Disp: 90 tablet, Rfl: 1 .  promethazine-dextromethorphan (PROMETHAZINE-DM) 6.25-15 MG/5ML syrup, Take 2.5 mLs by mouth 4 (four) times daily as needed., Disp: 118 mL, Rfl: 0 .  cyclobenzaprine (FLEXERIL) 10 MG tablet, Take 10 mg by mouth 3 (three) times daily as needed. for muscle spams, Disp: , Rfl:  .  Icosapent Ethyl (  VASCEPA) 1 g CAPS, Take 2 capsules (2 g total) by mouth 2 (two) times daily. (Patient not taking: Reported on 02/21/2018), Disp: 120 capsule, Rfl: 11 .  metoprolol tartrate (LOPRESSOR) 25 MG tablet, Take 1 tablet (25 mg total) by mouth 2 (two) times daily., Disp: 180 tablet, Rfl: 1 .  Vitamin D, Ergocalciferol, (DRISDOL) 1.25 MG (50000 UT) CAPS capsule, Take 1 capsule (50,000 Units total) by mouth every 7 (seven) days. (Patient not taking: Reported on 01/30/2019), Disp: 4 capsule, Rfl: 0  No Known Allergies  I  personally reviewed active problem list, medication list, allergies with the patient/caregiver today.  ROS  Ten systems reviewed and is negative except as mentioned in HPI.  Objective  Virtual encounter, vitals not obtained.  Body mass index is 28.7 kg/m.  Nursing Note and Vital Signs reviewed.  Physical Exam  Constitutional: Patient appears well-developed and well-nourished. No distress.  HENT: Head: Normocephalic and atraumatic. He reports maxillary sinus tenderness and denies frontal sinus tenderness on self-palpation. Neck: Normal range of motion. Pulmonary/Chest: Effort normal. No respiratory distress. Speaking in complete sentences Neurological: Pt is alert and oriented to person, place, and time. Coordination, speech and gait are normal.  Psychiatric: Patient has a normal mood and affect. behavior is normal. Judgment and thought content normal. Abdomen: Pt palpates abdomen and denies any tenderness.  Abdomen appears soft and non-distended on video.  No results found for this or any previous visit (from the past 72 hour(s)).  Assessment & Plan  1. Acute non-recurrent maxillary sinusitis - doxycycline (VIBRA-TABS) 100 MG tablet; Take 1 tablet (100 mg total) by mouth 2 (two) times daily for 7 days.  Dispense: 14 tablet; Refill: 0 - fluticasone (FLONASE) 50 MCG/ACT nasal spray; Place 2 sprays into both nostrils daily.  Dispense: 16 g; Refill: 6 - fexofenadine (ALLEGRA ALLERGY) 180 MG tablet; Take 1 tablet (180 mg total) by mouth daily.  Dispense: 90 tablet; Refill: 1  2. Shortness of breath - We will provide albuterol inhaler PRN at for shortness of breath, and cover for possible CAP with doxycycline. - albuterol (VENTOLIN HFA) 108 (90 Base) MCG/ACT inhaler; Inhale 2 puffs into the lungs every 6 (six) hours as needed for wheezing or shortness of breath.  Dispense: 1 Inhaler; Refill: 0  3. Epigastric pain - Advised he maintain current GERD measures, trial antibiotic and  treatment for respiratory illness, and if not improving in 2 weeks, call Dr. Vicente Males to schedule a follow up.  If anything worsens, he will call our office for in person evaluation sooner. - No labs as he had negative H/ pylori in January 2020, CMP and CBC which were non-contributory.  Will defer to GI for further eval at this time.  4. Gastroesophageal reflux disease, esophagitis presence not specified - Seeing GI, see above regarding follow up recommendations.   -Red flags and when to present for emergency care or RTC including fever >101.47F, chest pain, shortness of breath, new/worsening/un-resolving symptoms, reviewed with patient at time of visit. Follow up and care instructions discussed and provided in AVS. - I discussed the assessment and treatment plan with the patient. The patient was provided an opportunity to ask questions and all were answered. The patient agreed with the plan and demonstrated an understanding of the instructions.  I provided 23 minutes of non-face-to-face time during this encounter.  Hubbard Hartshorn, FNP

## 2019-02-06 ENCOUNTER — Telehealth: Payer: Self-pay

## 2019-02-06 NOTE — Telephone Encounter (Signed)
Called patient from recall.  No answer. LMOV.  This is the 3rd attempt per recall list.  Will delete recall.

## 2019-02-26 ENCOUNTER — Encounter: Payer: Managed Care, Other (non HMO) | Admitting: Family Medicine

## 2019-02-26 ENCOUNTER — Ambulatory Visit (INDEPENDENT_AMBULATORY_CARE_PROVIDER_SITE_OTHER): Payer: Managed Care, Other (non HMO) | Admitting: Nurse Practitioner

## 2019-02-26 ENCOUNTER — Other Ambulatory Visit: Payer: Self-pay

## 2019-02-26 ENCOUNTER — Encounter: Payer: Self-pay | Admitting: Nurse Practitioner

## 2019-02-26 VITALS — BP 124/80 | HR 85 | Temp 97.6°F | Resp 12 | Ht 69.0 in | Wt 206.7 lb

## 2019-02-26 DIAGNOSIS — R0789 Other chest pain: Secondary | ICD-10-CM | POA: Diagnosis not present

## 2019-02-26 DIAGNOSIS — L739 Follicular disorder, unspecified: Secondary | ICD-10-CM

## 2019-02-26 DIAGNOSIS — Z0000 Encounter for general adult medical examination without abnormal findings: Secondary | ICD-10-CM | POA: Diagnosis not present

## 2019-02-26 DIAGNOSIS — D489 Neoplasm of uncertain behavior, unspecified: Secondary | ICD-10-CM

## 2019-02-26 DIAGNOSIS — R9431 Abnormal electrocardiogram [ECG] [EKG]: Secondary | ICD-10-CM

## 2019-02-26 MED ORDER — TRIAMCINOLONE ACETONIDE 0.1 % EX CREA: 1.0000 "application " | TOPICAL_CREAM | Freq: Two times a day (BID) | CUTANEOUS | 0 refills | Status: DC

## 2019-02-26 NOTE — Progress Notes (Signed)
Name: David Carlson   MRN: [id number]    DOB: 1970/10/29   Date:02/26/2019       Progress Note  Subjective  Chief Complaint  Chief Complaint  Patient presents with  . Annual Exam    HPI  Patient presents for annual CPE.  USPSTF grade A and B recommendations:  Diet:  Eats 1-2 meals a day, usually eats out or from a vending machine 1-2 vegetables a week Does not eat fruits Eats mostly chicken.  Drinks water and tea and V8 juice,  Exercise:  Does not have any routine exercise.    Patient additionally notes intermittent episodes of squeezing chest pain, palpitations and states feels like he has pressure that goes from around his chest up to his head that lasts a few seconds and then resolves. These intermittent episodes have been going on for years. He states it feels like his blood pressure is getting elevated. He tries to slow down and take deep breaths but it makes him more anxious. Patient has seen cardiology- Berge NP in 2018 for palpitations in the past and is taking BB, he did not continue routine follow-up with provider. Depression: phq 9 is negative Depression screen [facility] 2/9 02/26/2019 01/30/2019 09/29/2018 02/21/2018 12/20/2017  Decreased Interest 0 0 0 0 0  Down, Depressed, Hopeless 0 0 0 0 0  PHQ - 2 Score 0 0 0 0 0  Altered sleeping 0 0 0 - -  Tired, decreased energy 0 0 0 - -  Change in appetite 0 0 0 - -  Feeling bad or failure about yourself  0 0 0 - -  Trouble concentrating 0 0 0 - -  Moving slowly or fidgety/restless 0 0 0 - -  Suicidal thoughts 0 0 0 - -  PHQ-9 Score 0 0 0 - -  Difficult doing work/chores Not difficult at all Not difficult at all - - -    Hypertension:  BP Readings from Last 3 Encounters:  02/26/19 124/80  11/06/18 (!) 141/91  10/13/18 124/78    Obesity: Wt Readings from Last 3 Encounters:  02/26/19 206 lb 11.2 oz (93.8 kg)  01/30/19 200 lb (90.7 kg)  11/06/18 209 lb 3.2 oz (94.9 kg)   BMI Readings from Last 3 Encounters:   02/26/19 30.52 kg/m  01/30/19 28.70 kg/m  11/06/18 30.02 kg/m     Lipids:  Lab Results  Component Value Date   CHOL 174 10/13/2018   CHOL 236 (H) 12/20/2017   CHOL 160 05/19/2017   Lab Results  Component Value Date   HDL 35 (L) 10/13/2018   HDL 40 (L) 12/20/2017   HDL 43 05/19/2017   Lab Results  Component Value Date   LDLCALC 100 (H) 10/13/2018   [place]  12/20/2017     Comment:     . LDL cholesterol not calculated. Triglyceride levels greater than 400 mg/dL invalidate calculated LDL results. . Reference range: <100 . Desirable range <100 mg/dL for primary prevention;   <70 mg/dL for patients with CHD or diabetic patients  with > or = 2 CHD risk factors. Marland Kitchen LDL-C is now calculated using the Martin-Hopkins  calculation, which is a validated novel method providing  better accuracy than the Friedewald equation in the  estimation of LDL-C.  Cresenciano Genre et al. Annamaria Helling. 4128;786(76): 2061-2068  (http://education.[facility].com/faq/FAQ164)    [place]  05/19/2017     Comment:     . LDL cholesterol not calculated. Triglyceride levels greater than 400 mg/dL invalidate calculated LDL  results. . Reference range: <100 . Desirable range <100 mg/dL for primary prevention;   <70 mg/dL for patients with CHD or diabetic patients  with > or = 2 CHD risk factors. Marland Kitchen LDL-C is now calculated using the Martin-Hopkins  calculation, which is a validated novel method providing  better accuracy than the Friedewald equation in the  estimation of LDL-C.  Cresenciano Genre et al. Annamaria Helling. 2458;099(83): 2061-2068  (http://education.[facility].com/faq/FAQ164)    Lab Results  Component Value Date   TRIG 273 (H) 10/13/2018   TRIG 635 (H) 12/20/2017   TRIG 430 (H) 05/19/2017   Lab Results  Component Value Date   CHOLHDL 5.0 (H) 10/13/2018   CHOLHDL 5.9 (H) 12/20/2017   CHOLHDL 3.7 05/19/2017   No results found for: LDLDIRECT Glucose:  Glucose  Date Value Ref Range Status   05/27/2014 96 65 - 99 mg/dL Final  01/04/2013 91 65 - 99 mg/dL Final   Glucose, Bld  Date Value Ref Range Status  10/13/2018 83 65 - 99 mg/dL Final    Comment:    .            Fasting reference interval .   12/20/2017 87 65 - 139 mg/dL Final    Comment:    .        Non-fasting reference interval .   05/19/2017 91 65 - 99 mg/dL Final    Comment:    .            Fasting reference interval .       Office Visit from 02/26/2019 in [facility]  AUDIT-C Score  5     Patient has cut back drinking alcohol a lot, just on the weekends- drinks about 6 shots then.    Single STD testing and prevention (HIV/chl/gon/syphilis): denies Hep C: denies  Skin cancer: discussed is out in the sun a lot. Has a few spots on back that have become more itchy.  Colorectal cancer: denies changes in bowel movements of blood in stools IPSS Questionnaire (AUA-7): Over the past month.   1)  How often have you had a sensation of not emptying your bladder completely after you finish urinating?  1 - Less than 1 time in 5  2)  How often have you had to urinate again less than two hours after you finished urinating? 2 - Less than half the time  3)  How often have you found you stopped and started again several times when you urinated?  1 - Less than 1 time in 5  4) How difficult have you found it to postpone urination?  0 - Not at all  5) How often have you had a weak urinary stream?  0 - Not at all  6) How often have you had to push or strain to begin urination?  0 - Not at all  7) How many times did you most typically get up to urinate from the time you went to bed until the time you got up in the morning?  0 - None  Total score:  0-7 mildly symptomatic   8-19 moderately symptomatic   20-35 severely symptomatic    Lung cancer:  Low Dose CT Chest recommended if Age 91-80 years, 30 pack-year currently smoking OR have quit w/in 15years. Patient does not qualify.     Patient Active  Problem List   Diagnosis Date Noted  . Medication monitoring encounter 12/20/2017  . Annual physical exam 10/15/2016  . Vitamin D deficiency  01/13/2016  . Erectile dysfunction 01/13/2016  . Encounter for annual health examination 10/01/2015  . Fatigue 10/01/2015  . Left inguinal pain 07/10/2015  . GERD (gastroesophageal reflux disease) 03/14/2015  . Anxiety disorder 03/14/2015  . Hypercholesterolemia with hypertriglyceridemia 03/14/2015  . BP (high blood pressure) 03/14/2015  . Dermatitis, eczematoid 03/14/2015  . Headache, tension-type 03/14/2015  . Cervical pain 03/14/2015    Past Surgical History:  Procedure Laterality Date  . CARPAL TUNNEL RELEASE Bilateral   . HERNIA REPAIR  2015  . HIP SURGERY     a. Age 73 - failure of growth plate to close - Pins inserted    Family History  Problem Relation Age of Onset  . Diabetes Mother   . Hypertension Father   . Heart disease Maternal Grandmother   . Heart attack Maternal Grandmother   . Diabetes Maternal Grandfather   . Diabetes Paternal Grandfather     Social History   Socioeconomic History  . Marital status: Single    Spouse name: Not on file  . Number of children: 2  . Years of education: 30  . Highest education level: High school graduate  Occupational History  . Not on file  Social Needs  . Financial resource strain: Not hard at all  . Food insecurity    Worry: Never true    Inability: Never true  . Transportation needs    Medical: No    Non-medical: Not on file  Tobacco Use  . Smoking status: Former Smoker    Packs/day: 1.00    Years: 20.00    Pack years: 20.00    Types: Cigarettes    Quit date: 09/15/2015    Years since quitting: 3.4  . Smokeless tobacco: Never Used  Substance and Sexual Activity  . Alcohol use: Yes    Alcohol/week: 0.0 standard drinks    Comment: 3-6 cans/beer on weeknights, 6-12 beers on weekends.  . Drug use: No  . Sexual activity: Yes  Lifestyle  . Physical activity    Days  per week: 0 days    Minutes per session: 0 min  . Stress: Not at all  Relationships  . Social connections    Talks on phone: More than three times a week    Gets together: Once a week    Attends religious service: Never    Active member of club or organization: No    Attends meetings of clubs or organizations: Never    Relationship status: Divorced  . Intimate partner violence    Fear of current or ex partner: Patient refused    Emotionally abused: Patient refused    Physically abused: Patient refused    Forced sexual activity: Patient refused  Other Topics Concern  . Not on file  Social History Narrative   Lives in [place] with his ex-girlfriend's brother.  Works @ [employer] in [employer].  Active @ work w/o limitations but does not routinely exercise.     Current Outpatient Medications:  .  albuterol (VENTOLIN HFA) 108 (90 Base) MCG/ACT inhaler, Inhale 2 puffs into the lungs every 6 (six) hours as needed for wheezing or shortness of breath., Disp: 1 Inhaler, Rfl: 0 .  atorvastatin (LIPITOR) 20 MG tablet, Take 1 tablet (20 mg total) by mouth daily at 6 PM., Disp: 90 tablet, Rfl: 0 .  fexofenadine (ALLEGRA ALLERGY) 180 MG tablet, Take 1 tablet (180 mg total) by mouth daily., Disp: 90 tablet, Rfl: 1 .  fluticasone (FLONASE) 50 MCG/ACT nasal spray, Place 2  sprays into both nostrils daily., Disp: 16 g, Rfl: 6 .  losartan (COZAAR) 50 MG tablet, Take 1 tablet (50 mg total) by mouth daily., Disp: 90 tablet, Rfl: 0 .  metoprolol tartrate (LOPRESSOR) 25 MG tablet, Take 1 tablet (25 mg total) by mouth 2 (two) times daily., Disp: 180 tablet, Rfl: 1 .  pantoprazole (PROTONIX) 40 MG tablet, Take 1 tablet (40 mg total) by mouth daily as needed. Caution:prolonged use may cause health problems; appt needed, Disp: 90 tablet, Rfl: 1 .  cyclobenzaprine (FLEXERIL) 10 MG tablet, Take 10 mg by mouth 3 (three) times daily as needed. for muscle spams, Disp: , Rfl:  .  Icosapent Ethyl (VASCEPA) 1  g CAPS, Take 2 capsules (2 g total) by mouth 2 (two) times daily. (Patient not taking: Reported on 02/21/2018), Disp: 120 capsule, Rfl: 11 .  promethazine-dextromethorphan (PROMETHAZINE-DM) 6.25-15 MG/5ML syrup, Take 2.5 mLs by mouth 4 (four) times daily as needed. (Patient not taking: Reported on 02/26/2019), Disp: 118 mL, Rfl: 0 .  Vitamin D, Ergocalciferol, (DRISDOL) 1.25 MG (50000 UT) CAPS capsule, Take 1 capsule (50,000 Units total) by mouth every 7 (seven) days. (Patient not taking: Reported on 01/30/2019), Disp: 4 capsule, Rfl: 0  No Known Allergies   Review of Systems  Constitutional: Negative for chills, fever and malaise/fatigue.  HENT: Positive for congestion. Negative for sinus pain and sore throat.   Eyes: Negative for blurred vision.  Respiratory: Positive for cough (when he gets to hot he coughs). Negative for shortness of breath.   Cardiovascular: Negative for chest pain, palpitations and leg swelling.  Gastrointestinal: Negative for abdominal pain, blood in stool, constipation, diarrhea and nausea.  Genitourinary: Negative for dysuria and urgency.  Musculoskeletal: Negative for falls and joint pain.  Skin: Negative for rash.  Neurological: Positive for dizziness (with coughing spells). Negative for tingling and headaches.  Endo/Heme/Allergies: Negative for polydipsia.  Psychiatric/Behavioral: Negative for depression. The patient is not nervous/anxious and does not have insomnia.       Objective  Vitals:   02/26/19 1524  BP: 124/80  Pulse: 85  Resp: 12  Temp: 97.6 F (36.4 C)  TempSrc: Oral  SpO2: 95%  Weight: 206 lb 11.2 oz (93.8 kg)  Height: 5\' 9"  (1.753 m)    Body mass index is 30.52 kg/m.  Physical Exam  Constitutional: Patient appears well-developed and well-nourished. No distress.  HENT: Head: Normocephalic and atraumatic. Ears: B TMs ok, no erythema or effusion;  Eyes: Conjunctivae and EOM are normal. Pupils are equal, round, and reactive to light. No  scleral icterus.  Neck: Normal range of motion. Neck supple. No JVD present. No thyromegaly present.  Cardiovascular: elevated rate, regular rhythm and normal heart sounds.  No murmur heard. No BLE edema. Pulmonary/Chest: Effort normal and breath sounds normal. No respiratory distress. Abdominal: Soft. Bowel sounds are normal, no distension. There is no tenderness. no masses MALE GENITALIA: deferred  Musculoskeletal: Normal range of motion, no joint effusions. No gross deformities Neurological: he is alert and oriented to person, place, and time. No cranial nerve deficit. Coordination, balance, strength, speech and gait are normal.  Skin: Skin is warm and dry. Patient has numerous nevi on arms an back, patient has nevi on mid back with irregular borders. Patient also notes 2 red raised spots that are particularly itchy- redness and excoriation marks noted.  Psychiatric: Patient has a normal mood and affect. behavior is normal. Judgment and thought content normal.  No results found for this or any previous visit (  from the past 2160 hour(s)).   PHQ2/9: Depression screen [facility] 2/9 02/26/2019 01/30/2019 09/29/2018 02/21/2018 12/20/2017  Decreased Interest 0 0 0 0 0  Down, Depressed, Hopeless 0 0 0 0 0  PHQ - 2 Score 0 0 0 0 0  Altered sleeping 0 0 0 - -  Tired, decreased energy 0 0 0 - -  Change in appetite 0 0 0 - -  Feeling bad or failure about yourself  0 0 0 - -  Trouble concentrating 0 0 0 - -  Moving slowly or fidgety/restless 0 0 0 - -  Suicidal thoughts 0 0 0 - -  PHQ-9 Score 0 0 0 - -  Difficult doing work/chores Not difficult at all Not difficult at all - - -     Fall Risk: Fall Risk  01/30/2019 09/29/2018 02/21/2018 12/20/2017 09/21/2017  Falls in the past year? 0 0 No No No  Number falls in past yr: 0 - - - -  Injury with Fall? 0 - - - -      Functional Status Survey: Is the patient deaf or have difficulty hearing?: Yes Does the patient have difficulty seeing, even when wearing  glasses/contacts?: No Does the patient have difficulty concentrating, remembering, or making decisions?: No Does the patient have difficulty dressing or bathing?: No Does the patient have difficulty doing errands alone such as visiting a doctor's office or shopping?: No    Assessment & Plan  1. General medical exam Discussed improved diet and exercise.  - EKG 12-Lead: sinus tachycardia and  - CBC with Differential - COMPLETE METABOLIC PANEL WITH GFR - TSH - Lipid Profile  2. Neoplasm of uncertain behavior - Ambulatory referral to Dermatology  3. Folliculitis - triamcinolone cream (KENALOG) 0.1 %; Apply 1 application topically 2 (two) times daily.  Dispense: 30 g; Refill: 0  4. Chest pressure Referring to cardiology if episodes occur and do not quickly resolve with rest instructed to call 911. EKG is sinus tachy with T- wave changes- as these symptoms have been ongoing for months and have completely resolved do not believe he needs to go to the ER presently.  - EKG 12-Lead - CBC with Differential - COMPLETE METABOLIC PANEL WITH GFR  -Prostate cancer screening and PSA options (with potential risks and benefits of testing vs not testing) were discussed along with recent recs/guidelines. -USPSTF grade A and B recommendations reviewed with patient; age-appropriate recommendations, preventive care, screening tests, etc discussed and encouraged; healthy living encouraged; see AVS for patient education given to patient -Discussed importance of 150 minutes of physical activity weekly, eat two servings of fish weekly, eat one serving of tree nuts ( cashews, pistachios, pecans, almonds.Marland Kitchen) every other day, eat 6 servings of fruit/vegetables daily and drink plenty of water and avoid sweet beverages.

## 2019-02-27 LAB — CBC WITH DIFFERENTIAL/PLATELET
Absolute Monocytes: 429 cells/uL (ref 200–950)
Basophils Absolute: 52 cells/uL (ref 0–200)
Basophils Relative: 0.9 %
Eosinophils Absolute: 81 cells/uL (ref 15–500)
Eosinophils Relative: 1.4 %
HCT: 40.3 % (ref 38.5–50.0)
Hemoglobin: 12.9 g/dL — ABNORMAL LOW (ref 13.2–17.1)
Lymphs Abs: 1467 cells/uL (ref 850–3900)
MCH: 28.8 pg (ref 27.0–33.0)
MCHC: 32 g/dL (ref 32.0–36.0)
MCV: 90 fL (ref 80.0–100.0)
MPV: 11 fL (ref 7.5–12.5)
Monocytes Relative: 7.4 %
Neutro Abs: 3770 cells/uL (ref 1500–7800)
Neutrophils Relative %: 65 %
Platelets: 260 10*3/uL (ref 140–400)
RBC: 4.48 10*6/uL (ref 4.20–5.80)
RDW: 13.4 % (ref 11.0–15.0)
Total Lymphocyte: 25.3 %
WBC: 5.8 10*3/uL (ref 3.8–10.8)

## 2019-02-27 LAB — COMPLETE METABOLIC PANEL WITH GFR
AG Ratio: 1.7 (calc) (ref 1.0–2.5)
ALT: 19 U/L (ref 9–46)
AST: 19 U/L (ref 10–40)
Albumin: 4.1 g/dL (ref 3.6–5.1)
Alkaline phosphatase (APISO): 62 U/L (ref 36–130)
BUN: 14 mg/dL (ref 7–25)
CO2: 25 mmol/L (ref 20–32)
Calcium: 9 mg/dL (ref 8.6–10.3)
Chloride: 107 mmol/L (ref 98–110)
Creat: 1.33 mg/dL (ref 0.60–1.35)
GFR, Est African American: 73 mL/min/{1.73_m2} (ref >=60)
GFR, Est Non African American: 63 mL/min/{1.73_m2} (ref >=60)
Globulin: 2.4 g/dL (calc) (ref 1.9–3.7)
Glucose, Bld: 77 mg/dL (ref 65–99)
Potassium: 3.9 mmol/L (ref 3.5–5.3)
Sodium: 143 mmol/L (ref 135–146)
Total Bilirubin: 1.2 mg/dL (ref 0.2–1.2)
Total Protein: 6.5 g/dL (ref 6.1–8.1)

## 2019-02-27 LAB — LIPID PANEL
Cholesterol: 137 mg/dL (ref <200)
HDL: 31 mg/dL — ABNORMAL LOW (ref >=40)
LDL Cholesterol (Calc): 80 mg/dL (calc)
Non-HDL Cholesterol (Calc): 106 mg/dL (calc) (ref <130)
Total CHOL/HDL Ratio: 4.4 (calc) (ref <5.0)
Triglycerides: 161 mg/dL — ABNORMAL HIGH (ref <150)

## 2019-02-27 LAB — TSH: TSH: 0.82 mIU/L (ref 0.40–4.50)

## 2019-03-20 ENCOUNTER — Other Ambulatory Visit: Payer: Self-pay | Admitting: Family Medicine

## 2019-03-20 DIAGNOSIS — I10 Essential (primary) hypertension: Secondary | ICD-10-CM

## 2019-03-22 ENCOUNTER — Telehealth: Payer: Self-pay | Admitting: Nurse Practitioner

## 2019-03-22 ENCOUNTER — Ambulatory Visit: Payer: Managed Care, Other (non HMO) | Admitting: Nurse Practitioner

## 2019-03-22 NOTE — Telephone Encounter (Signed)

## 2019-03-23 ENCOUNTER — Ambulatory Visit
Admission: RE | Admit: 2019-03-23 | Discharge: 2019-03-23 | Disposition: A | Discharge status: Home / Self Care | Payer: Managed Care, Other (non HMO) | Source: Ambulatory Visit | Attending: Nurse Practitioner | Admitting: Nurse Practitioner

## 2019-03-23 ENCOUNTER — Inpatient Hospital Stay
Admission: EM | Admit: 2019-03-23 | Discharge: 2019-03-28 | DRG: 175 | Disposition: A | Discharge status: Home / Self Care | Payer: Managed Care, Other (non HMO) | Attending: Internal Medicine | Admitting: Internal Medicine

## 2019-03-23 ENCOUNTER — Ambulatory Visit (INDEPENDENT_AMBULATORY_CARE_PROVIDER_SITE_OTHER): Payer: Managed Care, Other (non HMO) | Admitting: Nurse Practitioner

## 2019-03-23 ENCOUNTER — Encounter: Payer: Self-pay | Admitting: Nurse Practitioner

## 2019-03-23 ENCOUNTER — Ambulatory Visit (INDEPENDENT_AMBULATORY_CARE_PROVIDER_SITE_OTHER): Payer: Managed Care, Other (non HMO)

## 2019-03-23 ENCOUNTER — Telehealth: Payer: Self-pay | Admitting: Nurse Practitioner

## 2019-03-23 ENCOUNTER — Other Ambulatory Visit: Payer: Self-pay

## 2019-03-23 ENCOUNTER — Encounter: Payer: Self-pay | Admitting: *Deleted

## 2019-03-23 VITALS — BP 132/84 | HR 108 | Ht 70.0 in | Wt 208.1 lb

## 2019-03-23 DIAGNOSIS — Z6831 Body mass index (BMI) 31.0-31.9, adult: Secondary | ICD-10-CM | POA: Diagnosis not present

## 2019-03-23 DIAGNOSIS — I2699 Other pulmonary embolism without acute cor pulmonale: Secondary | ICD-10-CM

## 2019-03-23 DIAGNOSIS — M7989 Other specified soft tissue disorders: Secondary | ICD-10-CM | POA: Diagnosis not present

## 2019-03-23 DIAGNOSIS — N179 Acute kidney failure, unspecified: Secondary | ICD-10-CM

## 2019-03-23 DIAGNOSIS — K219 Gastro-esophageal reflux disease without esophagitis: Secondary | ICD-10-CM | POA: Diagnosis present

## 2019-03-23 DIAGNOSIS — I493 Ventricular premature depolarization: Secondary | ICD-10-CM

## 2019-03-23 DIAGNOSIS — I428 Other cardiomyopathies: Secondary | ICD-10-CM | POA: Diagnosis present

## 2019-03-23 DIAGNOSIS — Z87891 Personal history of nicotine dependence: Secondary | ICD-10-CM | POA: Diagnosis not present

## 2019-03-23 DIAGNOSIS — R778 Other specified abnormalities of plasma proteins: Secondary | ICD-10-CM

## 2019-03-23 DIAGNOSIS — I361 Nonrheumatic tricuspid (valve) insufficiency: Secondary | ICD-10-CM | POA: Diagnosis not present

## 2019-03-23 DIAGNOSIS — R17 Unspecified jaundice: Secondary | ICD-10-CM

## 2019-03-23 DIAGNOSIS — R000 Tachycardia, unspecified: Secondary | ICD-10-CM

## 2019-03-23 DIAGNOSIS — I340 Nonrheumatic mitral (valve) insufficiency: Secondary | ICD-10-CM | POA: Diagnosis not present

## 2019-03-23 DIAGNOSIS — I10 Essential (primary) hypertension: Secondary | ICD-10-CM | POA: Diagnosis present

## 2019-03-23 DIAGNOSIS — Z1159 Encounter for screening for other viral diseases: Secondary | ICD-10-CM

## 2019-03-23 DIAGNOSIS — K8070 Calculus of gallbladder and bile duct without cholecystitis without obstruction: Secondary | ICD-10-CM | POA: Diagnosis present

## 2019-03-23 DIAGNOSIS — R002 Palpitations: Secondary | ICD-10-CM | POA: Diagnosis not present

## 2019-03-23 DIAGNOSIS — R52 Pain, unspecified: Secondary | ICD-10-CM

## 2019-03-23 DIAGNOSIS — I313 Pericardial effusion (noninflammatory): Secondary | ICD-10-CM | POA: Diagnosis present

## 2019-03-23 DIAGNOSIS — R7989 Other specified abnormal findings of blood chemistry: Secondary | ICD-10-CM | POA: Diagnosis not present

## 2019-03-23 DIAGNOSIS — I2510 Atherosclerotic heart disease of native coronary artery without angina pectoris: Secondary | ICD-10-CM | POA: Diagnosis present

## 2019-03-23 DIAGNOSIS — I82501 Chronic embolism and thrombosis of unspecified deep veins of right lower extremity: Secondary | ICD-10-CM | POA: Diagnosis present

## 2019-03-23 DIAGNOSIS — E785 Hyperlipidemia, unspecified: Secondary | ICD-10-CM | POA: Diagnosis present

## 2019-03-23 DIAGNOSIS — Z8249 Family history of ischemic heart disease and other diseases of the circulatory system: Secondary | ICD-10-CM | POA: Diagnosis not present

## 2019-03-23 DIAGNOSIS — Z833 Family history of diabetes mellitus: Secondary | ICD-10-CM

## 2019-03-23 DIAGNOSIS — I5021 Acute systolic (congestive) heart failure: Secondary | ICD-10-CM | POA: Diagnosis present

## 2019-03-23 DIAGNOSIS — I2609 Other pulmonary embolism with acute cor pulmonale: Secondary | ICD-10-CM

## 2019-03-23 DIAGNOSIS — E669 Obesity, unspecified: Secondary | ICD-10-CM | POA: Diagnosis present

## 2019-03-23 DIAGNOSIS — I081 Rheumatic disorders of both mitral and tricuspid valves: Secondary | ICD-10-CM | POA: Diagnosis present

## 2019-03-23 DIAGNOSIS — I429 Cardiomyopathy, unspecified: Secondary | ICD-10-CM | POA: Diagnosis not present

## 2019-03-23 DIAGNOSIS — R0602 Shortness of breath: Secondary | ICD-10-CM | POA: Insufficient documentation

## 2019-03-23 DIAGNOSIS — I420 Dilated cardiomyopathy: Secondary | ICD-10-CM | POA: Diagnosis not present

## 2019-03-23 LAB — COMPREHENSIVE METABOLIC PANEL
ALT: 180 U/L — ABNORMAL HIGH (ref 0–44)
AST: 87 U/L — ABNORMAL HIGH (ref 15–41)
Albumin: 3.5 g/dL (ref 3.5–5.0)
Alkaline Phosphatase: 67 U/L (ref 38–126)
Anion gap: 10 (ref 5–15)
BUN: 30 mg/dL — ABNORMAL HIGH (ref 6–20)
CO2: 23 mmol/L (ref 22–32)
Calcium: 8.8 mg/dL — ABNORMAL LOW (ref 8.9–10.3)
Chloride: 103 mmol/L (ref 98–111)
Creatinine, Ser: 2.24 mg/dL — ABNORMAL HIGH (ref 0.61–1.24)
GFR calc Af Amer: 39 mL/min — ABNORMAL LOW (ref >60)
GFR calc non Af Amer: 34 mL/min — ABNORMAL LOW (ref >60)
Glucose, Bld: 93 mg/dL (ref 70–99)
Potassium: 3.5 mmol/L (ref 3.5–5.1)
Sodium: 136 mmol/L (ref 135–145)
Total Bilirubin: 2.8 mg/dL — ABNORMAL HIGH (ref 0.3–1.2)
Total Protein: 7.1 g/dL (ref 6.5–8.1)

## 2019-03-23 LAB — CBC WITH DIFFERENTIAL/PLATELET
Abs Immature Granulocytes: 0.05 10*3/uL (ref 0.00–0.07)
Basophils Absolute: 0 10*3/uL (ref 0.0–0.1)
Basophils Relative: 0 %
Eosinophils Absolute: 0 10*3/uL (ref 0.0–0.5)
Eosinophils Relative: 0 %
HCT: 41.1 % (ref 39.0–52.0)
Hemoglobin: 12.8 g/dL — ABNORMAL LOW (ref 13.0–17.0)
Immature Granulocytes: 1 %
Lymphocytes Relative: 13 %
Lymphs Abs: 1.1 10*3/uL (ref 0.7–4.0)
MCH: 28.2 pg (ref 26.0–34.0)
MCHC: 31.1 g/dL (ref 30.0–36.0)
MCV: 90.5 fL (ref 80.0–100.0)
Monocytes Absolute: 0.9 10*3/uL (ref 0.1–1.0)
Monocytes Relative: 10 %
Neutro Abs: 6.5 10*3/uL (ref 1.7–7.7)
Neutrophils Relative %: 76 %
Platelets: 235 10*3/uL (ref 150–400)
RBC: 4.54 MIL/uL (ref 4.22–5.81)
RDW: 15.8 % — ABNORMAL HIGH (ref 11.5–15.5)
WBC: 8.6 10*3/uL (ref 4.0–10.5)
nRBC: 0 % (ref 0.0–0.2)

## 2019-03-23 LAB — APTT: aPTT: 35 seconds (ref 24–36)

## 2019-03-23 LAB — PROTIME-INR
INR: 1.4 — ABNORMAL HIGH (ref 0.8–1.2)
Prothrombin Time: 17 seconds — ABNORMAL HIGH (ref 11.4–15.2)

## 2019-03-23 LAB — HEPARIN LEVEL (UNFRACTIONATED): Heparin Unfractionated: 0.2 IU/mL — ABNORMAL LOW (ref 0.30–0.70)

## 2019-03-23 LAB — ANTITHROMBIN III: AntiThromb III Func: 83 % (ref 75–120)

## 2019-03-23 LAB — SARS CORONAVIRUS 2 BY RT PCR (HOSPITAL ORDER, PERFORMED IN ~~LOC~~ HOSPITAL LAB): SARS Coronavirus 2: NEGATIVE

## 2019-03-23 MED ORDER — ONDANSETRON HCL 4 MG/2ML IJ SOLN: 4.0000 mg | Freq: Four times a day (QID) | INTRAMUSCULAR | Status: DC | PRN

## 2019-03-23 MED ORDER — ALBUTEROL SULFATE (2.5 MG/3ML) 0.083% IN NEBU: 3.0000 mL | INHALATION_SOLUTION | Freq: Four times a day (QID) | RESPIRATORY_TRACT | Status: DC | PRN

## 2019-03-23 MED ORDER — PANTOPRAZOLE SODIUM 40 MG PO TBEC
40.0000 mg | DELAYED_RELEASE_TABLET | Freq: Every day | ORAL | Status: DC | PRN
  Administered 2019-03-26: 40 mg via ORAL
  Filled 2019-03-23 (×2): qty 1

## 2019-03-23 MED ORDER — OXYCODONE HCL 5 MG PO TABS
5.0000 mg | ORAL_TABLET | ORAL | Status: DC | PRN
  Administered 2019-03-23 – 2019-03-27 (×5): 5 mg via ORAL
  Filled 2019-03-23 (×5): qty 1

## 2019-03-23 MED ORDER — ATORVASTATIN CALCIUM 20 MG PO TABS
20.0000 mg | ORAL_TABLET | Freq: Every day | ORAL | Status: DC
  Administered 2019-03-23 – 2019-03-27 (×5): 20 mg via ORAL
  Filled 2019-03-23 (×5): qty 1

## 2019-03-23 MED ORDER — HEPARIN BOLUS VIA INFUSION
5000.0000 [IU] | Freq: Once | INTRAVENOUS | Status: AC
  Administered 2019-03-23: 5000 [IU] via INTRAVENOUS
  Filled 2019-03-23: qty 5000

## 2019-03-23 MED ORDER — SODIUM CHLORIDE 0.9 % IV SOLN
INTRAVENOUS | Status: DC
  Administered 2019-03-23 – 2019-03-25 (×4): via INTRAVENOUS

## 2019-03-23 MED ORDER — ONDANSETRON HCL 4 MG PO TABS: 4.0000 mg | ORAL_TABLET | Freq: Four times a day (QID) | ORAL | Status: DC | PRN

## 2019-03-23 MED ORDER — METOPROLOL TARTRATE 25 MG PO TABS
25.0000 mg | ORAL_TABLET | Freq: Two times a day (BID) | ORAL | Status: DC
  Administered 2019-03-23 – 2019-03-24 (×2): 25 mg via ORAL
  Filled 2019-03-23 (×2): qty 1

## 2019-03-23 MED ORDER — FLUTICASONE PROPIONATE 50 MCG/ACT NA SUSP
2.0000 | Freq: Every day | NASAL | Status: DC
  Administered 2019-03-24 – 2019-03-28 (×4): 2 via NASAL
  Filled 2019-03-23 (×2): qty 16

## 2019-03-23 MED ORDER — HEPARIN BOLUS VIA INFUSION
1400.0000 [IU] | Freq: Once | INTRAVENOUS | Status: AC
  Administered 2019-03-23: 1400 [IU] via INTRAVENOUS
  Filled 2019-03-23: qty 1400

## 2019-03-23 MED ORDER — IOHEXOL 350 MG/ML SOLN
75.0000 mL | Freq: Once | INTRAVENOUS | Status: AC | PRN
  Administered 2019-03-23: 75 mL via INTRAVENOUS

## 2019-03-23 MED ORDER — HEPARIN (PORCINE) 25000 UT/250ML-% IV SOLN
2150.0000 [IU]/h | INTRAVENOUS | Status: DC
  Administered 2019-03-23: 16:00:00 1550 [IU]/h via INTRAVENOUS
  Administered 2019-03-24 – 2019-03-25 (×4): 1950 [IU]/h via INTRAVENOUS
  Administered 2019-03-26 – 2019-03-27 (×2): 2150 [IU]/h via INTRAVENOUS
  Filled 2019-03-23 (×7): qty 250

## 2019-03-23 MED ORDER — SODIUM CHLORIDE 0.9 % IV SOLN
1000.0000 mL | Freq: Once | INTRAVENOUS | Status: AC
  Administered 2019-03-23: 14:00:00 1000 mL via INTRAVENOUS

## 2019-03-23 NOTE — H&P (Signed)
[facility] NAME: David Carlson    MR#:  [id number]  DATE OF BIRTH:  Dec 26, 1970  DATE OF ADMISSION:  03/23/2019  PRIMARY CARE PHYSICIAN: [facility]  REQUESTING/REFERRING PHYSICIAN: Dr. Lavonia Drafts  CHIEF COMPLAINT:   Chief Complaint  Patient presents with  . Pulmonary Embolisum    HISTORY OF PRESENT ILLNESS:  David Carlson  is a 48 y.o. male coming in with shortness of breath going on for 6 months.  He states he has been very short of breath especially when he is doing things.  He has left-sided chest pain feeling like almost acid reflux and more with taking a deep breath and walking around.  He is anxious about his breathing.  He has had 2 rounds of antibiotics without any help.  He has been feeling very fatigued and very short of breath.  He saw Toma Deiters today and was sent for CAT scan of the chest which was positive for pulmonary embolism and possible pulmonary infarct.  Patient was sent into the ER.  Hospitalist services contacted for admission.  Of note he was taking some etodolac for knee pain a few weeks ago.  PAST MEDICAL HISTORY:   Past Medical History:  Diagnosis Date  . Anxiety   . Broken shoulder   . Collar bone fracture    x 2  . Erectile dysfunction   . GERD (gastroesophageal reflux disease)   . H/O Bell's palsy   . Hyperlipidemia   . Hypertension   . Obesity   . Palpitations    a. Previously evaluted when he was incarcerated in [place] - reportedly nl stress test.  . PVC's (premature ventricular contractions)    a. 04/2013 Echo: EF 50-55%, Gr1 DD, mildly dil LA;  b. 04/2017 48hr holter: freq PVCs (5.4%), rare PACs. Single 13 beat atrial run - 143 bpm.    PAST SURGICAL HISTORY:   Past Surgical History:  Procedure Laterality Date  . CARPAL TUNNEL RELEASE Bilateral   . HERNIA REPAIR  2015  . HIP SURGERY     a. Age 47 - failure of growth plate to close - Pins inserted     SOCIAL HISTORY:   Social History   Tobacco Use  . Smoking status: Former Smoker    Packs/day: 1.00    Years: 20.00    Pack years: 20.00    Types: Cigarettes    Quit date: 09/15/2015    Years since quitting: 3.5  . Smokeless tobacco: Never Used  Substance Use Topics  . Alcohol use: Yes    Alcohol/week: 0.0 standard drinks    Comment: 3-6 cans/beer on weeknights, 6-12 beers on weekends.    FAMILY HISTORY:   Family History  Problem Relation Age of Onset  . Diabetes Mother   . Hypertension Father   . Heart disease Maternal Grandmother   . Heart attack Maternal Grandmother   . Diabetes Maternal Grandfather   . Diabetes Paternal Grandfather     DRUG ALLERGIES:  No Known Allergies  REVIEW OF SYSTEMS:  CONSTITUTIONAL: No fever, chills or sweats.  Positive for fatigue.  EYES: No blurred or double vision.  EARS, NOSE, AND THROAT: No tinnitus or ear pain. No sore throat RESPIRATORY: Positive for cough, positive for shortness of breath.  No wheezing or hemoptysis.  CARDIOVASCULAR: Positive for chest pain, no orthopnea, edema.  GASTROINTESTINAL: Some nausea.no vomiting, diarrhea or abdominal pain. No blood in bowel movements GENITOURINARY: No dysuria, hematuria.  ENDOCRINE:  No polyuria, nocturia,  HEMATOLOGY: No anemia, easy bruising or bleeding SKIN: No rash or lesion. MUSCULOSKELETAL: Some knee pain but that went away NEUROLOGIC: No tingling, numbness, weakness.  PSYCHIATRY: Positive for anxiety  MEDICATIONS AT HOME:   Prior to Admission medications   Medication Sig Start Date End Date Taking? Authorizing Provider  albuterol (VENTOLIN HFA) 108 (90 Base) MCG/ACT inhaler Inhale 2 puffs into the lungs every 6 (six) hours as needed for wheezing or shortness of breath. 01/30/19  Yes Hubbard Hartshorn, FNP  atorvastatin (LIPITOR) 20 MG tablet Take 1 tablet (20 mg total) by mouth daily at 6 PM. 10/13/18  Yes Lada, Satira Anis, MD  cyclobenzaprine (FLEXERIL) 10 MG tablet Take 10 mg  by mouth 3 (three) times daily as needed. for muscle spams 08/27/18  Yes [provider]  fluticasone (FLONASE) 50 MCG/ACT nasal spray Place 2 sprays into both nostrils daily. 01/30/19  Yes Hubbard Hartshorn, FNP  losartan (COZAAR) 50 MG tablet Take 1 tablet by mouth once daily 03/20/19  Yes Poulose, Bethel Born, NP  metoprolol tartrate (LOPRESSOR) 25 MG tablet Take 1 tablet (25 mg total) by mouth 2 (two) times daily. 10/13/18 03/23/19 Yes Lada, Satira Anis, MD  pantoprazole (PROTONIX) 40 MG tablet Take 1 tablet (40 mg total) by mouth daily as needed. Caution:prolonged use may cause health problems; appt needed Patient taking differently: Take 40 mg by mouth daily as needed (GERD symptoms). Caution:prolonged use may cause health problems; appt needed 10/13/18  Yes Lada, Satira Anis, MD      VITAL SIGNS:  Blood pressure (!) 131/106, pulse (!) 106, temperature (!) 97.5 F (36.4 C), temperature source Oral, resp. rate (!) 22, height 5\' 10"  (1.778 m), weight 94.3 kg, SpO2 98 %.  PHYSICAL EXAMINATION:  GENERAL:  48 y.o.-year-old patient lying in the bed with no acute distress.  EYES: Pupils equal, round, reactive to light and accommodation. No scleral icterus. Extraocular muscles intact.  HEENT: Head atraumatic, normocephalic. Oropharynx and nasopharynx clear.  NECK:  Supple, no jugular venous distention. No thyroid enlargement, no tenderness.  LUNGS: Decreased breath sounds bilaterally, no wheezing, rales,rhonchi or crepitation. No use of accessory muscles of respiration.  CARDIOVASCULAR: S1, S2 tachycardic. No murmurs, rubs, or gallops.  ABDOMEN: Soft, nontender, nondistended. Bowel sounds present. No organomegaly or mass.  EXTREMITIES: No pedal edema, cyanosis, or clubbing.  NEUROLOGIC: Cranial nerves II through XII are intact. Muscle strength 5/5 in all extremities. Sensation intact. Gait not checked.  PSYCHIATRIC: The patient is alert and oriented x 3.  SKIN: No rash, lesion, or ulcer.    LABORATORY PANEL:   CBC Recent Labs  Lab 03/23/19 1231  WBC 8.6  HGB 12.8*  HCT 41.1  PLT 235   ------------------------------------------------------------------------------------------------------------------  Chemistries  Recent Labs  Lab 03/23/19 1231  NA 136  K 3.5  CL 103  CO2 23  GLUCOSE 93  BUN 30*  CREATININE 2.24*  CALCIUM 8.8*  AST 87*  ALT 180*  ALKPHOS 67  BILITOT 2.8*   ------------------------------------------------------------------------------------------------------------------    RADIOLOGY:  Ct Angio Chest Pe W Or Wo Contrast  Addendum Date: 03/23/2019   ADDENDUM REPORT: 03/23/2019 12:20 ADDENDUM: These results were called by telephone at the time of interpretation on 03/23/2019 at 12:11 pm to Dr. Murray Hodgkins , who verbally acknowledged these results. Electronically Signed   By: Eddie Candle M.D.   On: 03/23/2019 12:20   Result Date: 03/23/2019 CLINICAL DATA:  Shortness of breath, tachycardia EXAM: CT ANGIOGRAPHY CHEST WITH CONTRAST TECHNIQUE:  Multidetector CT imaging of the chest was performed using the standard protocol during bolus administration of intravenous contrast. Multiplanar CT image reconstructions and MIPs were obtained to evaluate the vascular anatomy. CONTRAST:  52mL OMNIPAQUE IOHEXOL 350 MG/ML SOLN COMPARISON:  None. FINDINGS: Cardiovascular: Satisfactory opacification of the pulmonary arteries to the segmental level. Positive examination for pulmonary embolism with multifocal occlusive segmental to subsegmental embolus present bilaterally, most notably in the bilateral lower lobes (series 5, image 145). Cardiomegaly, with enlarged right ventricle although preserved RV LV ratio, less than 0.8. No pericardial effusion. Mediastinum/Nodes: No enlarged mediastinal, hilar, or axillary lymph nodes. Thyroid gland, trachea, and esophagus demonstrate no significant findings. Lungs/Pleura: Small bilateral pleural effusions and mild, diffuse  bilateral interlobular septal thickening. There is ground-glass and consolidative opacity of left lung base and to a lesser extent the lateral lateral segment right middle lobe and lateral segment right lower lobe, distal to embolus and a concerning for developing pulmonary infarction (series 6, image 59). Upper Abdomen: No acute abnormality. Musculoskeletal: No chest wall abnormality. No acute or significant osseous findings. Review of the MIP images confirms the above findings. IMPRESSION: 1. Positive examination for pulmonary embolism with multifocal occlusive segmental to subsegmental embolus present bilaterally, most notably in the bilateral lower lobes (series 5, image 145). 2. There is ground-glass and consolidative opacity of left lung base and to a lesser extent the lateral lateral segment right middle lobe and lateral segment right lower lobe, distal to embolus and a concerning for developing pulmonary infarction (series 6, image 59). 3. Pleural effusions and diffuse bilateral interlobular septal thickening, likely edema. 4. Cardiomegaly, with enlarged right ventricle although preserved RV LV ratio, less than 0.8. Correlate with clinical and echocardiographic evidence of right heart strain. These results were called by telephone at the time of interpretation, final communication will be documented in addendum. Electronically Signed: By: Eddie Candle M.D. On: 03/23/2019 12:06   Vas [place] Lower Extremity Venous (dvt)  Result Date: 03/23/2019  Lower Venous Study Other Indications: Swelling and mild tenderness of the left calf for 6 weeks.                    Dyspnea for 6 months, progressing 6 weeks ago. Risk Factors: None identified. Comparison Study: None Performing Technologist: Pilar Jarvis RDMS, RVT, RDCS  Examination Guidelines: A complete evaluation includes B-mode imaging, spectral Doppler, color Doppler, and power Doppler as needed of all accessible portions of each vessel. Bilateral testing is  considered an integral part of a complete examination. Limited examinations for reoccurring indications may be performed as noted.  +---------+---------------+---------+-----------+----------+-------+ RIGHT    CompressibilityPhasicitySpontaneityPropertiesSummary +---------+---------------+---------+-----------+----------+-------+ CFV      Full           Yes      Yes                          +---------+---------------+---------+-----------+----------+-------+ SFJ      Full           Yes      Yes                          +---------+---------------+---------+-----------+----------+-------+ FV Prox  Full           Yes      Yes                          +---------+---------------+---------+-----------+----------+-------+ FV Mid  Full           Yes      Yes                          +---------+---------------+---------+-----------+----------+-------+ FV DistalFull           Yes      Yes                          +---------+---------------+---------+-----------+----------+-------+ PFV      Full           Yes      Yes                          +---------+---------------+---------+-----------+----------+-------+ POP      Full           Yes      Yes                          +---------+---------------+---------+-----------+----------+-------+ PTV      Full           Yes      Yes                          +---------+---------------+---------+-----------+----------+-------+ PERO     None           No       No         retracted Chronic +---------+---------------+---------+-----------+----------+-------+ Soleal   Full           Yes      Yes                          +---------+---------------+---------+-----------+----------+-------+ Gastroc  Full           Yes      Yes                          +---------+---------------+---------+-----------+----------+-------+ GSV      Full           Yes      Yes                           +---------+---------------+---------+-----------+----------+-------+ SSV      Full           Yes      Yes                          +---------+---------------+---------+-----------+----------+-------+ Both peroneal veins appear diminutive and atrophied versus dilated. Both veins contain highly echogenic material.  +---------+---------------+---------+-----------+----------+-------+ LEFT     CompressibilityPhasicitySpontaneityPropertiesSummary +---------+---------------+---------+-----------+----------+-------+ CFV      Full           Yes      Yes                          +---------+---------------+---------+-----------+----------+-------+ SFJ      Full           Yes      Yes                          +---------+---------------+---------+-----------+----------+-------+ FV Prox  Full  Yes      Yes                          +---------+---------------+---------+-----------+----------+-------+ FV Mid   Full           Yes      Yes                          +---------+---------------+---------+-----------+----------+-------+ FV DistalFull           Yes      Yes                          +---------+---------------+---------+-----------+----------+-------+ PFV      Full           Yes      Yes                          +---------+---------------+---------+-----------+----------+-------+ POP      Full           Yes      Yes                          +---------+---------------+---------+-----------+----------+-------+ PTV      Full           Yes      Yes                          +---------+---------------+---------+-----------+----------+-------+ PERO     Full           Yes      Yes                          +---------+---------------+---------+-----------+----------+-------+ Soleal   Full           Yes      Yes                          +---------+---------------+---------+-----------+----------+-------+ Gastroc  Full           Yes      Yes                           +---------+---------------+---------+-----------+----------+-------+ GSV      Full           Yes      Yes                          +---------+---------------+---------+-----------+----------+-------+ SSV      Full           Yes      Yes                          +---------+---------------+---------+-----------+----------+-------+    Summary: Right: No reflux was noted in the common femoral vein. Findings consistent with chronic deep vein thrombosis involving the right peroneal veins. All other veins visualized appear fully compressible and demonstrate appropriate Doppler characteristics. Left: No evidence of deep vein thrombosis in the lower extremity. No indirect evidence of obstruction proximal to the inguinal ligament.  *See table(s) above for measurements and observations.    Preliminary     EKG:   Sinus tachycardia 106  bpm  IMPRESSION AND PLAN:   1.  Pulmonary embolism multifocal with possibility of pulmonary infarct.  Hypercoagulable work-up sent off.  Heparin drip ordered.  Patient has a chronic DVT right lower extremity. 2.  Acute kidney injury likely on chronic kidney disease stage III.  IV fluid hydration since the patient received IV contrast for CT scan of the chest 3.  Hypertension.  Hold losartan and give metoprolol. 4.  GERD on Protonix 5.  Knee pain has gone away hold off any further anti-inflammatories  All the records are reviewed and case discussed with ED provider. Management plans discussed with the patient, and he is agreement.  Patient does not want me to call any family members.  CODE STATUS: full code  TOTAL TIME TAKING CARE OF THIS PATIENT: 45 minutes.    Loletha Grayer M.D on 03/23/2019 at 2:54 PM  Between 7am to 6pm - Pager - [phone]  After 6pm call admission pager [phone]  [facility]  [phone]  CC: Primary care physician; Arnetha Courser, MD

## 2019-03-23 NOTE — ED Triage Notes (Signed)
First nurse note- had outpatient CT and positive for PE. Ambulated over from CT, unlabored.

## 2019-03-23 NOTE — Progress Notes (Signed)
ANTICOAGULATION CONSULT NOTE - Initial Consult  Pharmacy Consult for Heparin Indication: pulmonary embolus  No Known Allergies  Patient Measurements: Height: 5\' 10"  (177.8 cm) Weight: 208 lb (94.3 kg) IBW/kg (Calculated) : 73 Heparin Dosing Weight: 92.2 kg  Vital Signs: Temp: 97.9 F (36.6 C) (07/10 1917) Temp Source: Oral (07/10 1556) BP: 128/93 (07/10 1917) Pulse Rate: 111 (07/10 1917)  Labs: Recent Labs    03/23/19 1231 03/23/19 2235  HGB 12.8*  --   HCT 41.1  --   PLT 235  --   APTT 35  --   LABPROT 17.0*  --   INR 1.4*  --   HEPARINUNFRC  --  0.20*  CREATININE 2.24*  --     Estimated Creatinine Clearance: 47 mL/min (A) (by C-G formula based on SCr of 2.24 mg/dL (H)).   Assessment: 48 yo male here with PE. No oral anticoagulants noted on PTA med list.    7/10 Heparin infusion started @ 1550 units/hr.   Goal of Therapy:  Heparin level 0.3-0.7 units/ml Monitor platelets by anticoagulation protocol: Yes   Plan:  7/10 @ 2235 HL: 0.20. Level is subtherapeutic.  Will order heparin bolus 1400 units IV x1  And increase heparin infusion to  1750 units/hr. Recheck Heparin level in 6h after infusion rate change Continue to check HL and CBC daily while on heparin per protocol. CBC in AM  Pernell Dupre, PharmD, BCPS Clinical Pharmacist 03/23/2019 11:12 PM

## 2019-03-23 NOTE — ED Notes (Signed)
Per handoff by Jacques Navy pt has been hypertensive entire ED visit - will ask provider if he would like to address prior to admission to floor

## 2019-03-23 NOTE — ED Notes (Signed)
Discussed BP reading with Dr Corky Downs and he states that he does not feel these BP's need to be addressed

## 2019-03-23 NOTE — ED Notes (Signed)
Pharmacy delivered heparin

## 2019-03-23 NOTE — Care Management (Signed)
ED RNCM following if needed for anticoagulant authorization if patient discharges from ED to home. EDRN updated.

## 2019-03-23 NOTE — ED Provider Notes (Signed)
[facility] Emergency Department Provider Note   ____________________________________________    I have reviewed the triage vital signs and the nursing notes.   HISTORY  Chief Complaint Pulmonary Embolisum     HPI David Carlson is a 48 y.o. male who was sent to the emergency department for evaluation of pulmonary emboli noted on CT scan.  Patient reports over the last 2 weeks he has had chest discomfort and severe shortness of breath with exhaustion.  Denies fevers or chills.  Had outpatient work-up which led to CT scan which demonstrated PEs, no history of PE in the past.  No recent trauma, no recent surgery, no known clotting disorders.  Past Medical History:  Diagnosis Date  . Anxiety   . Broken shoulder   . Collar bone fracture    x 2  . Erectile dysfunction   . GERD (gastroesophageal reflux disease)   . H/O Bell's palsy   . Hyperlipidemia   . Hypertension   . Obesity   . Palpitations    a. Previously evaluted when he was incarcerated in [place] - reportedly nl stress test.  . PVC's (premature ventricular contractions)    a. 04/2013 Echo: EF 50-55%, Gr1 DD, mildly dil LA;  b. 04/2017 48hr holter: freq PVCs (5.4%), rare PACs. Single 13 beat atrial run - 143 bpm.    Patient Active Problem List   Diagnosis Date Noted  . Pulmonary embolism ([place]) 03/23/2019  . Medication monitoring encounter 12/20/2017  . Annual physical exam 10/15/2016  . Vitamin D deficiency 01/13/2016  . Erectile dysfunction 01/13/2016  . Encounter for annual health examination 10/01/2015  . Fatigue 10/01/2015  . Left inguinal pain 07/10/2015  . GERD (gastroesophageal reflux disease) 03/14/2015  . Anxiety disorder 03/14/2015  . Hypercholesterolemia with hypertriglyceridemia 03/14/2015  . BP (high blood pressure) 03/14/2015  . Dermatitis, eczematoid 03/14/2015  . Headache, tension-type 03/14/2015  . Cervical pain 03/14/2015    Past Surgical History:  Procedure  Laterality Date  . CARPAL TUNNEL RELEASE Bilateral   . HERNIA REPAIR  2015  . HIP SURGERY     a. Age 67 - failure of growth plate to close - Pins inserted    Prior to Admission medications   Medication Sig Start Date End Date Taking? Authorizing Provider  albuterol (VENTOLIN HFA) 108 (90 Base) MCG/ACT inhaler Inhale 2 puffs into the lungs every 6 (six) hours as needed for wheezing or shortness of breath. 01/30/19  Yes Hubbard Hartshorn, FNP  atorvastatin (LIPITOR) 20 MG tablet Take 1 tablet (20 mg total) by mouth daily at 6 PM. 10/13/18  Yes Lada, Satira Anis, MD  cyclobenzaprine (FLEXERIL) 10 MG tablet Take 10 mg by mouth 3 (three) times daily as needed. for muscle spams 08/27/18  Yes [provider]  fluticasone (FLONASE) 50 MCG/ACT nasal spray Place 2 sprays into both nostrils daily. 01/30/19  Yes Hubbard Hartshorn, FNP  losartan (COZAAR) 50 MG tablet Take 1 tablet by mouth once daily 03/20/19  Yes Poulose, Bethel Born, NP  metoprolol tartrate (LOPRESSOR) 25 MG tablet Take 1 tablet (25 mg total) by mouth 2 (two) times daily. 10/13/18 03/23/19 Yes Lada, Satira Anis, MD  pantoprazole (PROTONIX) 40 MG tablet Take 1 tablet (40 mg total) by mouth daily as needed. Caution:prolonged use may cause health problems; appt needed Patient taking differently: Take 40 mg by mouth daily as needed (GERD symptoms). Caution:prolonged use may cause health problems; appt needed 10/13/18  Yes Lada, Satira Anis, MD  fexofenadine (ALLEGRA ALLERGY) 180 MG tablet Take 1 tablet (180 mg total) by mouth daily. Patient not taking: Reported on 03/23/2019 01/30/19   Hubbard Hartshorn, FNP  Icosapent Ethyl (VASCEPA) 1 g CAPS Take 2 capsules (2 g total) by mouth 2 (two) times daily. Patient not taking: Reported on 03/23/2019 12/21/17   Arnetha Courser, MD  triamcinolone cream (KENALOG) 0.1 % Apply 1 application topically 2 (two) times daily. Patient not taking: Reported on 03/23/2019 02/26/19   Fredderick Severance, NP     Allergies Patient  has no known allergies.  Family History  Problem Relation Age of Onset  . Diabetes Mother   . Hypertension Father   . Heart disease Maternal Grandmother   . Heart attack Maternal Grandmother   . Diabetes Maternal Grandfather   . Diabetes Paternal Grandfather     Social History Social History   Tobacco Use  . Smoking status: Former Smoker    Packs/day: 1.00    Years: 20.00    Pack years: 20.00    Types: Cigarettes    Quit date: 09/15/2015    Years since quitting: 3.5  . Smokeless tobacco: Never Used  Substance Use Topics  . Alcohol use: Yes    Alcohol/week: 0.0 standard drinks    Comment: 3-6 cans/beer on weeknights, 6-12 beers on weekends.  . Drug use: No    Review of Systems  Constitutional: No fever/chills Eyes: No visual changes.  ENT: No sore throat. Cardiovascular: Occasional pleurisy Respiratory: As above Gastrointestinal: No abdominal pain.  No nausea, no vomiting.   Genitourinary: Negative for dysuria. Musculoskeletal: Negative for back pain. Skin: Negative for rash. Neurological: Negative for headaches or weakness   ____________________________________________   PHYSICAL EXAM:  VITAL SIGNS: ED Triage Vitals  Enc Vitals Group     BP 03/23/19 1242 (!) 131/100     Pulse Rate 03/23/19 1242 (!) 107     Resp 03/23/19 1242 11     Temp 03/23/19 1242 (!) 97.5 F (36.4 C)     Temp Source 03/23/19 1242 Oral     SpO2 --      Weight 03/23/19 1244 94.3 kg (208 lb)     Height 03/23/19 1244 1.778 m (5\' 10" )     Head Circumference --      Peak Flow --      Pain Score 03/23/19 1243 5     Pain Loc --      Pain Edu? --      Excl. in [place]? --     Constitutional: Alert and oriented.   Nose: No congestion/rhinnorhea. Mouth/Throat: Mucous membranes are moist.    Cardiovascular: Tachycardia, regular rhythm. Grossly normal heart sounds.  Good peripheral circulation. Respiratory: Normal respiratory effort.  No retractions. Lungs CTAB. Gastrointestinal: Soft and  nontender. No distention.  No CVA tenderness.  Musculoskeletal: No lower extremity tenderness nor edema.  Warm and well perfused Neurologic:  Normal speech and language. No gross focal neurologic deficits are appreciated.  Skin:  Skin is warm, dry and intact. No rash noted. Psychiatric: Mood and affect are normal. Speech and behavior are normal.  ____________________________________________   LABS (all labs ordered are listed, but only abnormal results are displayed)  Labs Reviewed  CBC WITH DIFFERENTIAL/PLATELET - Abnormal; Notable for the following components:      Result Value   Hemoglobin 12.8 (*)    RDW 15.8 (*)    All other components within normal limits  COMPREHENSIVE METABOLIC PANEL - Abnormal; Notable for the following  components:   BUN 30 (*)    Creatinine, Ser 2.24 (*)    Calcium 8.8 (*)    AST 87 (*)    ALT 180 (*)    Total Bilirubin 2.8 (*)    GFR calc non Af Amer 34 (*)    GFR calc Af Amer 39 (*)    All other components within normal limits  PROTIME-INR - Abnormal; Notable for the following components:   Prothrombin Time 17.0 (*)    INR 1.4 (*)    All other components within normal limits  SARS CORONAVIRUS 2 (HOSPITAL ORDER, [place] [facility])  APTT  HEPARIN LEVEL (UNFRACTIONATED)   ____________________________________________  EKG  ED ECG REPORT I, Lavonia Drafts, the attending physician, personally viewed and interpreted this ECG.  Date: 03/23/2019  Rhythm: Tachycardia QRS Axis: normal Intervals: normal ST/T Wave abnormalities: normal Narrative Interpretation: no evidence of acute ischemia  ____________________________________________  RADIOLOGY  Reviewed CT angiography ____________________________________________   PROCEDURES  Procedure(s) performed: No  Procedures   Critical Care performed: yes  CRITICAL CARE Performed by: Lavonia Drafts   Total critical care time: 30 minutes  Critical care time was  exclusive of separately billable procedures and treating other patients.  Critical care was necessary to treat or prevent imminent or life-threatening deterioration.  Critical care was time spent personally by me on the following activities: development of treatment plan with patient and/or surrogate as well as nursing, discussions with consultants, evaluation of patient's response to treatment, examination of patient, obtaining history from patient or surrogate, ordering and performing treatments and interventions, ordering and review of laboratory studies, ordering and review of radiographic studies, pulse oximetry and re-evaluation of patient's condition.  ____________________________________________   INITIAL IMPRESSION / ASSESSMENT AND PLAN / ED COURSE  Pertinent labs & imaging results that were available during my care of the patient were reviewed by me and considered in my medical decision making (see chart for details).  Patient presents with bilateral PEs with concern of pulmonary infarction, tachycardic, possible right heart strain.  We will start the patient on heparin and admit to the hospitalist service  Also has evidence of acute kidney injury    ____________________________________________   FINAL CLINICAL IMPRESSION(S) / ED DIAGNOSES  Final diagnoses:  Acute pulmonary embolism with acute cor pulmonale, unspecified pulmonary embolism type ([place])  Acute kidney injury ([place])        Note:  This document was prepared using Dragon voice recognition software and may include unintentional dictation errors.   Lavonia Drafts, MD 03/23/19 1434

## 2019-03-23 NOTE — Progress Notes (Signed)
ANTICOAGULATION CONSULT NOTE - Initial Consult  Pharmacy Consult for Heparin Indication: pulmonary embolus  No Known Allergies  Patient Measurements: Height: 5\' 10"  (177.8 cm) Weight: 208 lb (94.3 kg) IBW/kg (Calculated) : 73 Heparin Dosing Weight: 92.2 kg  Vital Signs: Temp: 97.5 F (36.4 C) (07/10 1242) Temp Source: Oral (07/10 1242) BP: 131/100 (07/10 1242) Pulse Rate: 107 (07/10 1242)  Labs: Recent Labs    03/23/19 1231  HGB 12.8*  HCT 41.1  PLT 235  APTT 35  LABPROT 17.0*  INR 1.4*  CREATININE 2.24*    Estimated Creatinine Clearance: 47 mL/min (A) (by C-G formula based on SCr of 2.24 mg/dL (H)).   Assessment: 48 yo male here with PE. No oral anticoagulants noted on PTA med list.   Goal of Therapy:  Heparin level 0.3-0.7 units/ml Monitor platelets by anticoagulation protocol: Yes   Plan:  Heparin bolus 5000 units IV x1 then heparin drip 1550 units/hr (=15.5 ml/hr) Heparin level in 6h after start of drip CBC in AM   David Carlson L 03/23/2019,2:02 PM

## 2019-03-23 NOTE — ED Notes (Signed)
Heparin not loaded in pyxis - called pharmacy to send medication

## 2019-03-23 NOTE — ED Triage Notes (Signed)
Pt sent to ED from CT positive for multiple PEs. Cardiologist sent pt to CT after months of chest pain and SOB that have been worsening. Pt is alert and oriented x 4. RR is even and unlabored at this time.

## 2019-03-23 NOTE — Patient Instructions (Addendum)
Medication Instructions:  - Your physician recommends that you continue on your current medications as directed. Please refer to the Current Medication list given to you today.  If you need a refill on your cardiac medications before your next appointment, please call your pharmacy.   Lab work: - none ordered  If you have labs (blood work) drawn today and your tests are completely normal, you will receive your results only by: Marland Kitchen MyChart Message (if you have MyChart) OR . A paper copy in the mail If you have any lab test that is abnormal or we need to change your treatment, we will call you to review the results.  Testing/Procedures: - Your physician has requested that you have a lower venous duplex (done today). This test is an ultrasound of the veins in the legw. It looks at venous blood flow that carries blood from the heart to the legw. Allow one hour for a Lower Venous exam. There are no restrictions or special instructions.  - Non-Cardiac CT Angiography (CTA), is a special type of CT scan that uses a computer to produce multi-dimensional views of major blood vessels throughout the body. In CT angiography, a contrast material is injected through an IV to help visualize the blood vessels- CT of the Chest to look for a pulmonary embolus  - Please proceed to the [facility] at this time, 1st desk on the right to check in. - You may have to wait a bit as you are being worked in to the schedule  - Your physician has requested that you have an echocardiogram (1-2 weeks). Echocardiography is a painless test that uses sound waves to create images of your heart. It provides your doctor with information about the size and shape of your heart and how well your heart's chambers and valves are working. This procedure takes approximately one hour. There are no restrictions for this procedure.   Follow-Up: At [facility], you and your health needs are our priority.  As part of our continuing  mission to provide you with exceptional heart care, we have created designated Provider Care Teams.  These Care Teams include your primary Cardiologist (physician) and Advanced Practice Providers (APPs -  Physician Assistants and Nurse Practitioners) who all work together to provide you with the care you need, when you need it. . in 10-14 days with Dr. Arley Phenix, NP (after echo is done)  Any Other Special Instructions Will Be Listed Below (If Applicable). - N/A

## 2019-03-23 NOTE — Progress Notes (Signed)
Office Visit    Patient Name: David Carlson Date of Encounter: 03/23/2019  Primary Care Provider:  Arnetha Courser, MD Primary Cardiologist:  Ida Rogue, MD  Chief Complaint    48 year old male with history of palpitations, hypertension, hyperlipidemia, obesity, GERD, anxiety, PVCs, and erectile dysfunction, who presents for follow-up of a 6-week history of dyspnea and left lower extremity swelling.  Past Medical History    Past Medical History:  Diagnosis Date   Anxiety    Broken shoulder    Collar bone fracture    x 2   Erectile dysfunction    GERD (gastroesophageal reflux disease)    H/O Bell's palsy    Hyperlipidemia    Hypertension    Obesity    Palpitations    a. Previously evaluted when he was incarcerated in [place] - reportedly nl stress test.   PVC's (premature ventricular contractions)    a. 04/2013 Echo: EF 50-55%, Gr1 DD, mildly dil LA;  b. 04/2017 48hr holter: freq PVCs (5.4%), rare PACs. Single 13 beat atrial run - 143 bpm.   Past Surgical History:  Procedure Laterality Date   CARPAL TUNNEL RELEASE Bilateral    HERNIA REPAIR  2015   HIP SURGERY     a. Age 53 - failure of growth plate to close - Pins inserted    Allergies  No Known Allergies  History of Present Illness    48 year old male with the above past medical history including palpitations, hypertension, hyperlipidemia, GERD, anxiety, obesity, and erectile dysfunction.  He has a history of palpitations that dates back to the age of 29.  He underwent stress testing while incarcerated in [place] in 2008 and this was reportedly normal.  He was told he was having PVCs.  After losing some weight, he had resolution of palpitations but then in 2018, he noted recurrent palpitations.  Echocardiography at that time showed normal LV function with diastolic dysfunction.  48-hour Holter monitoring showed frequent PVCs accounting for 5.4% of total beats.  He was placed on beta-blocker  therapy at that time with improvement in palpitations.  He was last seen in cardiology clinic in October 2018.  Since then, he is quit drinking and smoking.  He says that palpitations have been under control on beta-blocker therapy.  Over the winter, he had a respiratory illness that was treated as a cold and eventually he did receive antibiotics.  Following that, he says he has had some dyspnea on exertion and then was out of work for at least the month of April in the setting of the COVID-19 pandemic.  He says he did a lot of laying around during that time period.  Since returning to work, he has noted dyspnea with somewhat minimal activity.  About 6 weeks ago or so, he also started to notice left calf swelling above his sock line.  There was no pain or tenderness.  Over the past 6 weeks, his dyspnea has progressed and he says about a week ago, he can only walk about 30 feet prior to having to take a break.  He has occasionally had chest pressure when lifting a large 50 pound plate off of his equipment at work but otherwise does not experience chest pain/pressure with exertion.  He was seen by primary care on June 15 and was noted to be in sinus tachycardia with new anterolateral T wave inversion and he was referred back to cardiology.  Today, he says he thinks his dyspnea is slightly  improved.  His left lower extremity swelling has also improved though he says last week his left leg was really swollen.  He denies PND, orthopnea, dizziness, syncope, or early satiety.  Home Medications    Prior to Admission medications   Medication Sig Start Date End Date Taking? Authorizing Provider  albuterol (VENTOLIN HFA) 108 (90 Base) MCG/ACT inhaler Inhale 2 puffs into the lungs every 6 (six) hours as needed for wheezing or shortness of breath. 01/30/19   Hubbard Hartshorn, FNP  atorvastatin (LIPITOR) 20 MG tablet Take 1 tablet (20 mg total) by mouth daily at 6 PM. 10/13/18   Lada, Satira Anis, MD  cyclobenzaprine  (FLEXERIL) 10 MG tablet Take 10 mg by mouth 3 (three) times daily as needed. for muscle spams 08/27/18   [provider]  fexofenadine (ALLEGRA ALLERGY) 180 MG tablet Take 1 tablet (180 mg total) by mouth daily. 01/30/19   Hubbard Hartshorn, FNP  fluticasone (FLONASE) 50 MCG/ACT nasal spray Place 2 sprays into both nostrils daily. 01/30/19   Hubbard Hartshorn, FNP  Icosapent Ethyl (VASCEPA) 1 g CAPS Take 2 capsules (2 g total) by mouth 2 (two) times daily. Patient not taking: Reported on 02/21/2018 12/21/17   Arnetha Courser, MD  losartan (COZAAR) 50 MG tablet Take 1 tablet by mouth once daily 03/20/19   Poulose, Bethel Born, NP  metoprolol tartrate (LOPRESSOR) 25 MG tablet Take 1 tablet (25 mg total) by mouth 2 (two) times daily. 10/13/18 02/26/19  Arnetha Courser, MD  pantoprazole (PROTONIX) 40 MG tablet Take 1 tablet (40 mg total) by mouth daily as needed. Caution:prolonged use may cause health problems; appt needed 10/13/18   Arnetha Courser, MD  triamcinolone cream (KENALOG) 0.1 % Apply 1 application topically 2 (two) times daily. 02/26/19   Poulose, Bethel Born, NP  Vitamin D, Ergocalciferol, (DRISDOL) 1.25 MG (50000 UT) CAPS capsule Take 1 capsule (50,000 Units total) by mouth every 7 (seven) days. Patient not taking: Reported on 01/30/2019 10/16/18   Arnetha Courser, MD    Review of Systems    Left lower extremity swelling, intermittent chest pressure with certain activities, dyspnea on exertion as outlined above.  He denies palpitations, PND, orthopnea, dizziness, syncope, or early satiety.  All other systems reviewed and are otherwise negative except as noted above.  Physical Exam    VS:  BP 132/84 (BP Location: Left Arm, Patient Position: Sitting, Cuff Size: Normal)    Pulse (!) 108    Ht 5\' 10"  (1.778 m)    Wt 208 lb 1.9 oz (94.4 kg)    BMI 29.86 kg/m  , BMI Body mass index is 29.86 kg/m. GEN: Well nourished, well developed, in no acute distress. HEENT: normal. Neck: Supple, no JVD, carotid  bruits, or masses. Cardiac: RRR, no murmurs, rubs, or gallops. No clubbing, cyanosis, edema.  Radials/DP/PT 2+ and equal bilaterally.  Respiratory:  Respirations regular and unlabored, clear to auscultation bilaterally. GI: Soft, nontender, nondistended, BS + x 4. MS: no deformity or atrophy. Skin: warm and dry, no rash. Neuro:  Strength and sensation are intact. Psych: Normal affect.  Accessory Clinical Findings    ECG personally reviewed by me today -sinus tachycardia, 108, anterolateral T wave inversion which is new since October 2018.  Lab Results  Component Value Date   WBC 5.8 02/26/2019   HGB 12.9 (L) 02/26/2019   HCT 40.3 02/26/2019   MCV 90.0 02/26/2019   PLT 260 02/26/2019   Lab Results  Component Value  Date   CREATININE 1.33 02/26/2019   BUN 14 02/26/2019   NA 143 02/26/2019   K 3.9 02/26/2019   CL 107 02/26/2019   CO2 25 02/26/2019   Lab Results  Component Value Date   ALT 19 02/26/2019   AST 19 02/26/2019   ALKPHOS 48 10/15/2016   BILITOT 1.2 02/26/2019   Lab Results  Component Value Date   CHOL 137 02/26/2019   HDL 31 (L) 02/26/2019   LDLCALC 80 02/26/2019   TRIG 161 (H) 02/26/2019   CHOLHDL 4.4 02/26/2019      Assessment & Plan    1.  Dyspnea on exertion: Patient had a respiratory illness over the winter and says it took a while to recover and feels as though perhaps he never fully recovered.  He has been having some degree of dyspnea on exertion since then but this is really worsened over the past 4 to 6 weeks.  He also reports significant left lower extremity swelling beginning about a month ago though overall, this has improved.  He never had any pain or tenderness in the area.  Though he denies any prolonged travel, he says that when he was out of work in the setting of COVID-19 for much of April, he did do a fair amount of laying around.  He is not swollen today.  Also notable that he has had sinus tachycardia on ECGs dating back to at least June  15.  I am concerned about the possibility of a left lower extremity DVT and potentially pulmonary embolus.  He is hemodynamically stable today and in no respiratory distress with normal pulse oximetry on room air.  **While in clinic, we obtained a LE u/s to r/o DVT.  This did not show anything on the left, but there did appear to be a chronic clot on the right.  I then sent him for STAT CTA of the chest to r/o PE and I just received a call from radiology that this has show multifocal occlusive segmental to subsegmental bilat lower lobe emboli w/ concern for developing pulm infarction.  In that setting, we have contacted the patient and advised that he present to the ED now (currenly still at [facility] radiology) for anticoagulation and further w/u, and possibly vascular eval for TPA.  2.  Left lower extremity swelling: This began about a month or so ago.  He denies any history of tenderness.  The left lower extremity is normal-appearing today.  No palpable cord.  Findings as above.  3.  Chest pressure/abnormal ECG: Patient reports about a 4 to 63-month history of intermittent chest pressure that occurs specifically when lifting about a 50 pound plate off of some of his equipment at work.  This does not appear to occur with other activities.  Suspect PE is major issue.  Will plan to f/u echo in setting of newly discovered PE, but no plan for ischemic eval @ this time.  4.  Palpitations/PVCs: Quiescent on beta-blocker.  5.  Hyperlipidemia: Recent LDL of 80 on statin therapy.  6.  Mild renal insufficiency: Labs in June showed mild creatinine elevation of 1.33.  Encouraged him to increase hydration.  7.  History of tobacco abuse: No longer smoking.  I congratulated him on this.  8.  Disposition: Pt to go to ED for admission, anticoagulation.  Murray Hodgkins, NP 03/23/2019, 8:33 AM

## 2019-03-23 NOTE — ED Notes (Signed)
ED TO INPATIENT HANDOFF REPORT  ED Nurse Name and Phone #: Helene Kelp [phone]  S Name/Age/Gender David Carlson 48 y.o. male Room/Bed: ED05A/ED05A  Code Status   Code Status: Not on file  Home/SNF/Other Home Patient oriented to: self, place, time and situation Is this baseline? Yes   Triage Complete: Triage complete  Chief Complaint pe  Triage Note First nurse note- had outpatient CT and positive for PE. Ambulated over from CT, unlabored.   Pt sent to ED from CT positive for multiple PEs. Cardiologist sent pt to CT after months of chest pain and SOB that have been worsening. Pt is alert and oriented x 4. RR is even and unlabored at this time.    Allergies No Known Allergies  Level of Care/Admitting Diagnosis ED Disposition    ED Disposition Condition [facility] Area: [place] [[id number]]  Level of Care: Med-Surg [16]  Covid Evaluation: Confirmed COVID Negative  Diagnosis: Pulmonary embolism [facility]) [[id number]]  Admitting Physician: Loletha Grayer [[id number]]  Attending Physician: Loletha Grayer [phone]  Estimated length of stay: past midnight tomorrow  Certification:: I certify this patient will need inpatient services for at least 2 midnights  PT Class (Do Not Modify): Inpatient [101]  PT Acc Code (Do Not Modify): Private [1]       B Medical/Surgery History Past Medical History:  Diagnosis Date  . Anxiety   . Broken shoulder   . Collar bone fracture    x 2  . Erectile dysfunction   . GERD (gastroesophageal reflux disease)   . H/O Bell's palsy   . Hyperlipidemia   . Hypertension   . Obesity   . Palpitations    a. Previously evaluted when he was incarcerated in [place] - reportedly nl stress test.  . PVC's (premature ventricular contractions)    a. 04/2013 Echo: EF 50-55%, Gr1 DD, mildly dil LA;  b. 04/2017 48hr holter: freq PVCs (5.4%), rare PACs. Single 13 beat atrial run - 143 bpm.   Past Surgical History:  Procedure  Laterality Date  . CARPAL TUNNEL RELEASE Bilateral   . HERNIA REPAIR  2015  . HIP SURGERY     a. Age 68 - failure of growth plate to close - Pins inserted     A IV Location/Drains/Wounds Patient Lines/Drains/Airways Status   Active Line/Drains/Airways    Name:   Placement date:   Placement time:   Site:   Days:   Peripheral IV 03/23/19 Right Forearm   03/23/19    1256    Forearm   less than 1   Peripheral IV 03/23/19 Right Antecubital   03/23/19    1355    Antecubital   less than 1          Intake/Output Last 24 hours No intake or output data in the 24 hours ending 03/23/19 1441  Labs/Imaging Results for orders placed or performed during the hospital encounter of 03/23/19 (from the past 48 hour(s))  CBC with Differential     Status: Abnormal   Collection Time: 03/23/19 12:31 PM  Result Value Ref Range   WBC 8.6 4.0 - 10.5 K/uL   RBC 4.54 4.22 - 5.81 MIL/uL   Hemoglobin 12.8 (L) 13.0 - 17.0 g/dL   HCT 41.1 39.0 - 52.0 %   MCV 90.5 80.0 - 100.0 fL   MCH 28.2 26.0 - 34.0 pg   MCHC 31.1 30.0 - 36.0 g/dL   RDW 15.8 (H) 11.5 - 15.5 %  Platelets 235 150 - 400 K/uL   nRBC 0.0 0.0 - 0.2 %   Neutrophils Relative % 76 %   Neutro Abs 6.5 1.7 - 7.7 K/uL   Lymphocytes Relative 13 %   Lymphs Abs 1.1 0.7 - 4.0 K/uL   Monocytes Relative 10 %   Monocytes Absolute 0.9 0.1 - 1.0 K/uL   Eosinophils Relative 0 %   Eosinophils Absolute 0.0 0.0 - 0.5 K/uL   Basophils Relative 0 %   Basophils Absolute 0.0 0.0 - 0.1 K/uL   Immature Granulocytes 1 %   Abs Immature Granulocytes 0.05 0.00 - 0.07 K/uL    Comment: Performed at [facility], [address] [place], [place] [address]  Comprehensive metabolic panel     Status: Abnormal   Collection Time: 03/23/19 12:31 PM  Result Value Ref Range   Sodium 136 135 - 145 mmol/L   Potassium 3.5 3.5 - 5.1 mmol/L   Chloride 103 98 - 111 mmol/L   CO2 23 22 - 32 mmol/L   Glucose, Bld 93 70 - 99 mg/dL   BUN 30 (H) 6 - 20 mg/dL    Creatinine, Ser 2.24 (H) 0.61 - 1.24 mg/dL   Calcium 8.8 (L) 8.9 - 10.3 mg/dL   Total Protein 7.1 6.5 - 8.1 g/dL   Albumin 3.5 3.5 - 5.0 g/dL   AST 87 (H) 15 - 41 U/L   ALT 180 (H) 0 - 44 U/L   Alkaline Phosphatase 67 38 - 126 U/L   Total Bilirubin 2.8 (H) 0.3 - 1.2 mg/dL   GFR calc non Af Amer 34 (L) >60 mL/min   GFR calc Af Amer 39 (L) >60 mL/min   Anion gap 10 5 - 15    Comment: Performed at [facility], [address] [place], [place] [address]  APTT     Status: None   Collection Time: 03/23/19 12:31 PM  Result Value Ref Range   aPTT 35 24 - 36 seconds    Comment: Performed at [facility], [address] [place], [place] [address]  Protime-INR     Status: Abnormal   Collection Time: 03/23/19 12:31 PM  Result Value Ref Range   Prothrombin Time 17.0 (H) 11.4 - 15.2 seconds   INR 1.4 (H) 0.8 - 1.2    Comment: (NOTE) INR goal varies based on device and disease states. Performed at [facility], [address] [place], [place] [address]   SARS Coronavirus 2 (CEPHEID - Performed in [facility] hospital lab), Hosp Order     Status: None   Collection Time: 03/23/19 12:49 PM   Specimen: Nasopharyngeal Swab  Result Value Ref Range   SARS Coronavirus 2 NEGATIVE NEGATIVE    Comment: (NOTE) If result is NEGATIVE SARS-CoV-2 target nucleic acids are NOT DETECTED. The SARS-CoV-2 RNA is generally detectable in upper and lower  respiratory specimens during the acute phase of infection. The lowest  concentration of SARS-CoV-2 viral copies this assay can detect is 250  copies / mL. A negative result does not preclude SARS-CoV-2 infection  and should not be used as the sole basis for treatment or other  patient management decisions.  A negative result may occur with  improper specimen collection / handling, submission of specimen other  than nasopharyngeal swab, presence of viral mutation(s) within the  areas targeted by this assay, and inadequate number  of viral copies  (<250 copies / mL). A negative result must be combined with clinical  observations,  patient history, and epidemiological information. If result is POSITIVE SARS-CoV-2 target nucleic acids are DETECTED. The SARS-CoV-2 RNA is generally detectable in upper and lower  respiratory specimens dur ing the acute phase of infection.  Positive  results are indicative of active infection with SARS-CoV-2.  Clinical  correlation with patient history and other diagnostic information is  necessary to determine patient infection status.  Positive results do  not rule out bacterial infection or co-infection with other viruses. If result is PRESUMPTIVE POSTIVE SARS-CoV-2 nucleic acids MAY BE PRESENT.   A presumptive positive result was obtained on the submitted specimen  and confirmed on repeat testing.  While 2019 novel coronavirus  (SARS-CoV-2) nucleic acids may be present in the submitted sample  additional confirmatory testing may be necessary for epidemiological  and / or clinical management purposes  to differentiate between  SARS-CoV-2 and other Sarbecovirus currently known to infect humans.  If clinically indicated additional testing with an alternate test  methodology [phone]) is advised. The SARS-CoV-2 RNA is generally  detectable in upper and lower respiratory sp ecimens during the acute  phase of infection. The expected result is Negative. Fact Sheet for Patients:  StrictlyIdeas.no Fact Sheet for Healthcare Providers: BankingDealers.co.za This test is not yet approved or cleared by the [place] FDA and has been authorized for detection and/or diagnosis of SARS-CoV-2 by FDA under an Emergency Use Authorization (EUA).  This EUA will remain in effect (meaning this test can be used) for the duration of the COVID-19 declaration under Section 564(b)(1) of the Act, 21 U.S.C. section 360bbb-3(b)(1), unless the authorization is  terminated or revoked sooner. Performed at [facility], [address] [place] [address]    Ct Angio Chest Pe W Or Wo Contrast  Addendum Date: 03/23/2019   ADDENDUM REPORT: 03/23/2019 12:20 ADDENDUM: These results were called by telephone at the time of interpretation on 03/23/2019 at 12:11 pm to Dr. Murray Hodgkins , who verbally acknowledged these results. Electronically Signed   By: Eddie Candle M.D.   On: 03/23/2019 12:20   Result Date: 03/23/2019 CLINICAL DATA:  Shortness of breath, tachycardia EXAM: CT ANGIOGRAPHY CHEST WITH CONTRAST TECHNIQUE: Multidetector CT imaging of the chest was performed using the standard protocol during bolus administration of intravenous contrast. Multiplanar CT image reconstructions and MIPs were obtained to evaluate the vascular anatomy. CONTRAST:  22mL OMNIPAQUE IOHEXOL 350 MG/ML SOLN COMPARISON:  None. FINDINGS: Cardiovascular: Satisfactory opacification of the pulmonary arteries to the segmental level. Positive examination for pulmonary embolism with multifocal occlusive segmental to subsegmental embolus present bilaterally, most notably in the bilateral lower lobes (series 5, image 145). Cardiomegaly, with enlarged right ventricle although preserved RV LV ratio, less than 0.8. No pericardial effusion. Mediastinum/Nodes: No enlarged mediastinal, hilar, or axillary lymph nodes. Thyroid gland, trachea, and esophagus demonstrate no significant findings. Lungs/Pleura: Small bilateral pleural effusions and mild, diffuse bilateral interlobular septal thickening. There is ground-glass and consolidative opacity of left lung base and to a lesser extent the lateral lateral segment right middle lobe and lateral segment right lower lobe, distal to embolus and a concerning for developing pulmonary infarction (series 6, image 59). Upper Abdomen: No acute abnormality. Musculoskeletal: No chest wall abnormality. No acute or significant osseous findings. Review  of the MIP images confirms the above findings. IMPRESSION: 1. Positive examination for pulmonary embolism with multifocal occlusive segmental to subsegmental embolus present bilaterally, most notably in the bilateral lower lobes (series 5, image 145). 2. There is ground-glass and consolidative opacity of left  lung base and to a lesser extent the lateral lateral segment right middle lobe and lateral segment right lower lobe, distal to embolus and a concerning for developing pulmonary infarction (series 6, image 59). 3. Pleural effusions and diffuse bilateral interlobular septal thickening, likely edema. 4. Cardiomegaly, with enlarged right ventricle although preserved RV LV ratio, less than 0.8. Correlate with clinical and echocardiographic evidence of right heart strain. These results were called by telephone at the time of interpretation, final communication will be documented in addendum. Electronically Signed: By: Eddie Candle M.D. On: 03/23/2019 12:06   Vas [place] Lower Extremity Venous (dvt)  Result Date: 03/23/2019  Lower Venous Study Other Indications: Swelling and mild tenderness of the left calf for 6 weeks.                    Dyspnea for 6 months, progressing 6 weeks ago. Risk Factors: None identified. Comparison Study: None Performing Technologist: Pilar Jarvis RDMS, RVT, RDCS  Examination Guidelines: A complete evaluation includes B-mode imaging, spectral Doppler, color Doppler, and power Doppler as needed of all accessible portions of each vessel. Bilateral testing is considered an integral part of a complete examination. Limited examinations for reoccurring indications may be performed as noted.  +---------+---------------+---------+-----------+----------+-------+ RIGHT    CompressibilityPhasicitySpontaneityPropertiesSummary +---------+---------------+---------+-----------+----------+-------+ CFV      Full           Yes      Yes                           +---------+---------------+---------+-----------+----------+-------+ SFJ      Full           Yes      Yes                          +---------+---------------+---------+-----------+----------+-------+ FV Prox  Full           Yes      Yes                          +---------+---------------+---------+-----------+----------+-------+ FV Mid   Full           Yes      Yes                          +---------+---------------+---------+-----------+----------+-------+ FV DistalFull           Yes      Yes                          +---------+---------------+---------+-----------+----------+-------+ PFV      Full           Yes      Yes                          +---------+---------------+---------+-----------+----------+-------+ POP      Full           Yes      Yes                          +---------+---------------+---------+-----------+----------+-------+ PTV      Full           Yes      Yes                          +---------+---------------+---------+-----------+----------+-------+  PERO     None           No       No         retracted Chronic +---------+---------------+---------+-----------+----------+-------+ Soleal   Full           Yes      Yes                          +---------+---------------+---------+-----------+----------+-------+ Gastroc  Full           Yes      Yes                          +---------+---------------+---------+-----------+----------+-------+ GSV      Full           Yes      Yes                          +---------+---------------+---------+-----------+----------+-------+ SSV      Full           Yes      Yes                          +---------+---------------+---------+-----------+----------+-------+ Both peroneal veins appear diminutive and atrophied versus dilated. Both veins contain highly echogenic material.  +---------+---------------+---------+-----------+----------+-------+ LEFT      CompressibilityPhasicitySpontaneityPropertiesSummary +---------+---------------+---------+-----------+----------+-------+ CFV      Full           Yes      Yes                          +---------+---------------+---------+-----------+----------+-------+ SFJ      Full           Yes      Yes                          +---------+---------------+---------+-----------+----------+-------+ FV Prox  Full           Yes      Yes                          +---------+---------------+---------+-----------+----------+-------+ FV Mid   Full           Yes      Yes                          +---------+---------------+---------+-----------+----------+-------+ FV DistalFull           Yes      Yes                          +---------+---------------+---------+-----------+----------+-------+ PFV      Full           Yes      Yes                          +---------+---------------+---------+-----------+----------+-------+ POP      Full           Yes      Yes                          +---------+---------------+---------+-----------+----------+-------+ PTV      Full  Yes      Yes                          +---------+---------------+---------+-----------+----------+-------+ PERO     Full           Yes      Yes                          +---------+---------------+---------+-----------+----------+-------+ Soleal   Full           Yes      Yes                          +---------+---------------+---------+-----------+----------+-------+ Gastroc  Full           Yes      Yes                          +---------+---------------+---------+-----------+----------+-------+ GSV      Full           Yes      Yes                          +---------+---------------+---------+-----------+----------+-------+ SSV      Full           Yes      Yes                          +---------+---------------+---------+-----------+----------+-------+    Summary: Right: No  reflux was noted in the common femoral vein. Findings consistent with chronic deep vein thrombosis involving the right peroneal veins. All other veins visualized appear fully compressible and demonstrate appropriate Doppler characteristics. Left: No evidence of deep vein thrombosis in the lower extremity. No indirect evidence of obstruction proximal to the inguinal ligament.  *See table(s) above for measurements and observations.    Preliminary     Pending Labs Unresulted Labs (From admission, onward)    Start     Ordered   03/24/19 0500  CBC  Tomorrow morning,   STAT     03/23/19 1413   03/23/19 2100  Heparin level (unfractionated)  Once-Timed,   STAT     03/23/19 1413   03/23/19 1437  Antithrombin III  (Hypercoagulable Panel, Comprehensive (PNL))  Once,   STAT     03/23/19 1436   03/23/19 1437  Protein C activity  (Hypercoagulable Panel, Comprehensive (PNL))  Once,   STAT     03/23/19 1436   03/23/19 1437  Protein C, total  (Hypercoagulable Panel, Comprehensive (PNL))  Once,   STAT     03/23/19 1436   03/23/19 1437  Protein S activity  (Hypercoagulable Panel, Comprehensive (PNL))  Once,   STAT     03/23/19 1436   03/23/19 1437  Protein S, total  (Hypercoagulable Panel, Comprehensive (PNL))  Once,   STAT     03/23/19 1436   03/23/19 1437  Lupus anticoagulant panel  (Hypercoagulable Panel, Comprehensive (PNL))  Once,   STAT     03/23/19 1436   03/23/19 1437  Beta-2-glycoprotein i abs, IgG/M/A  (Hypercoagulable Panel, Comprehensive (PNL))  Once,   STAT     03/23/19 1436   03/23/19 1437  Homocysteine, serum  (Hypercoagulable Panel, Comprehensive (PNL))  Once,   STAT     03/23/19 1436   03/23/19 1437  Factor 5 leiden  (Hypercoagulable Panel, Comprehensive (PNL))  Once,   STAT     03/23/19 1436   03/23/19 1437  Prothrombin gene mutation  (Hypercoagulable Panel, Comprehensive (PNL))  Once,   STAT     03/23/19 1436   03/23/19 1437  Cardiolipin antibodies, IgG, IgM, IgA  (Hypercoagulable  Panel, Comprehensive (PNL))  Once,   STAT     03/23/19 1436          Vitals/Pain Today's Vitals   03/23/19 1244 03/23/19 1300 03/23/19 1330 03/23/19 1400  BP:  (!) 123/109 (!) 128/108 (!) 131/106  Pulse:  (!) 103 (!) 105 (!) 106  Resp:  18  (!) 22  Temp:      TempSrc:      SpO2:  95% 98% 98%  Weight: 94.3 kg     Height: 5\' 10"  (1.778 m)     PainSc:        Isolation Precautions No active isolations  Medications Medications  heparin bolus via infusion 5,000 Units (has no administration in time range)    Followed by  heparin ADULT infusion 100 units/mL (25000 units/219mL sodium chloride 0.45%) (has no administration in time range)  0.9 %  sodium chloride infusion (1,000 mLs Intravenous New Bag/Given 03/23/19 1350)    Mobility walks Low fall risk   Focused Assessments see previous assessment   R Recommendations: See Admitting Provider Note  Report given to:   Additional Notes:

## 2019-03-23 NOTE — ED Notes (Signed)
Report given to [facility] and she was informed that admitting provider wanted labs drawn prior to starting heparin - lab called to meet pt on the floor for lab collection

## 2019-03-23 NOTE — Telephone Encounter (Signed)
Patient seen in clinic today by Ignacia Bayley, NP. He was tachycardiac/ SOB while in the office. LE venous duplex done and this was negative for DVT. He was sent for a STAT CTA of the chest to rule out PE.  Call received from radiologist to Ignacia Bayley, NP. Patient is positive for multiple PE's.  Per Gerald Stabs, NP, patient will need to go to the ER for further evaluation and treatment of his PE's.  I have called and spoken with the patient who is still in the radiology area at [facility]. I have advised him of his CT results and to please proceed to the ER for further evaluation/ treatment. The patient voices understanding and is agreeable.

## 2019-03-23 NOTE — ED Notes (Signed)
Pt given something to drink per Dr. Corky Downs okay.

## 2019-03-24 LAB — HEPARIN LEVEL (UNFRACTIONATED)
Heparin Unfractionated: 0.24 IU/mL — ABNORMAL LOW (ref 0.30–0.70)
Heparin Unfractionated: 0.39 IU/mL (ref 0.30–0.70)
Heparin Unfractionated: 0.4 IU/mL (ref 0.30–0.70)

## 2019-03-24 LAB — CBC
HCT: 38.4 % — ABNORMAL LOW (ref 39.0–52.0)
Hemoglobin: 12 g/dL — ABNORMAL LOW (ref 13.0–17.0)
MCH: 28.1 pg (ref 26.0–34.0)
MCHC: 31.3 g/dL (ref 30.0–36.0)
MCV: 89.9 fL (ref 80.0–100.0)
Platelets: 249 10*3/uL (ref 150–400)
RBC: 4.27 MIL/uL (ref 4.22–5.81)
RDW: 16.1 % — ABNORMAL HIGH (ref 11.5–15.5)
WBC: 6.3 10*3/uL (ref 4.0–10.5)
nRBC: 0 % (ref 0.0–0.2)

## 2019-03-24 LAB — BASIC METABOLIC PANEL
Anion gap: 12 (ref 5–15)
BUN: 33 mg/dL — ABNORMAL HIGH (ref 6–20)
CO2: 18 mmol/L — ABNORMAL LOW (ref 22–32)
Calcium: 8.2 mg/dL — ABNORMAL LOW (ref 8.9–10.3)
Chloride: 107 mmol/L (ref 98–111)
Creatinine, Ser: 1.8 mg/dL — ABNORMAL HIGH (ref 0.61–1.24)
GFR calc Af Amer: 51 mL/min — ABNORMAL LOW (ref >60)
GFR calc non Af Amer: 44 mL/min — ABNORMAL LOW (ref >60)
Glucose, Bld: 126 mg/dL — ABNORMAL HIGH (ref 70–99)
Potassium: 4.1 mmol/L (ref 3.5–5.1)
Sodium: 137 mmol/L (ref 135–145)

## 2019-03-24 LAB — HEMOGLOBIN A1C
Hgb A1c MFr Bld: 5.5 % (ref 4.8–5.6)
Mean Plasma Glucose: 111.15 mg/dL

## 2019-03-24 LAB — LUPUS ANTICOAGULANT PANEL
DRVVT: 39 s (ref 0.0–47.0)
PTT Lupus Anticoagulant: 38.6 s (ref 0.0–51.9)

## 2019-03-24 LAB — PROTEIN S ACTIVITY: Protein S Activity: 48 % — ABNORMAL LOW (ref 63–140)

## 2019-03-24 LAB — PROTEIN C ACTIVITY: Protein C Activity: 43 % — ABNORMAL LOW (ref 73–180)

## 2019-03-24 LAB — HIV ANTIBODY (ROUTINE TESTING W REFLEX): HIV Screen 4th Generation wRfx: NONREACTIVE

## 2019-03-24 LAB — PROTEIN S, TOTAL: Protein S Ag, Total: 77 % (ref 60–150)

## 2019-03-24 LAB — HOMOCYSTEINE: Homocysteine: 13.9 umol/L (ref 0.0–14.5)

## 2019-03-24 MED ORDER — LOSARTAN POTASSIUM 50 MG PO TABS: 50.0000 mg | ORAL_TABLET | Freq: Every day | ORAL | Status: DC

## 2019-03-24 MED ORDER — HEPARIN BOLUS VIA INFUSION
1400.0000 [IU] | Freq: Once | INTRAVENOUS | Status: AC
  Administered 2019-03-24: 06:00:00 1400 [IU] via INTRAVENOUS
  Filled 2019-03-24: qty 1400

## 2019-03-24 MED ORDER — METOPROLOL TARTRATE 25 MG PO TABS
25.0000 mg | ORAL_TABLET | Freq: Once | ORAL | Status: AC
  Administered 2019-03-24: 25 mg via ORAL
  Filled 2019-03-24: qty 1

## 2019-03-24 MED ORDER — METOPROLOL TARTRATE 50 MG PO TABS
50.0000 mg | ORAL_TABLET | Freq: Two times a day (BID) | ORAL | Status: DC
  Administered 2019-03-24 – 2019-03-27 (×6): 50 mg via ORAL
  Filled 2019-03-24 (×6): qty 1

## 2019-03-24 NOTE — Progress Notes (Signed)
[place] [facility] [place] NAME: David Carlson    MR#:  [id number]  DATE OF BIRTH:  1970/12/12  SUBJECTIVE:  CHIEF COMPLAINT:   Chief Complaint  Patient presents with  . Pulmonary Embolisum   Chest pain on deep breathing.  Still have shortness of breath and dry cough. REVIEW OF SYSTEMS:  Review of Systems  Constitutional: Positive for malaise/fatigue. Negative for chills and fever.  HENT: Negative for sore throat.   Eyes: Negative for blurred vision and double vision.  Respiratory: Positive for cough. Negative for hemoptysis, sputum production, shortness of breath, wheezing and stridor.   Cardiovascular: Positive for chest pain. Negative for palpitations, orthopnea and leg swelling.  Gastrointestinal: Negative for abdominal pain, blood in stool, diarrhea, melena, nausea and vomiting.  Genitourinary: Negative for dysuria, flank pain and hematuria.  Musculoskeletal: Negative for back pain and joint pain.  Skin: Negative for rash.  Neurological: Negative for dizziness, sensory change, focal weakness, seizures, loss of consciousness, weakness and headaches.  Endo/Heme/Allergies: Negative for polydipsia.  Psychiatric/Behavioral: Negative for depression. The patient is not nervous/anxious.     DRUG ALLERGIES:  No Known Allergies VITALS:  Blood pressure (!) 134/109, pulse (!) 109, temperature 98.3 F (36.8 C), temperature source Oral, resp. rate (!) 21, height 5\' 10"  (1.778 m), weight 94.3 kg, SpO2 100 %. PHYSICAL EXAMINATION:  Physical Exam Constitutional:      General: He is not in acute distress. HENT:     Head: Normocephalic.     Mouth/Throat:     Mouth: Mucous membranes are moist.  Eyes:     General: No scleral icterus.    Conjunctiva/sclera: Conjunctivae normal.     Pupils: Pupils are equal, round, and reactive to light.  Neck:     Musculoskeletal: Normal range of motion and neck supple.     Vascular: No JVD.     Trachea: No  tracheal deviation.  Cardiovascular:     Rate and Rhythm: Normal rate and regular rhythm.     Heart sounds: Normal heart sounds. No murmur. No gallop.   Pulmonary:     Effort: Pulmonary effort is normal. No respiratory distress.     Breath sounds: Normal breath sounds. No wheezing or rales.  Abdominal:     General: Bowel sounds are normal. There is no distension.     Palpations: Abdomen is soft.     Tenderness: There is no abdominal tenderness. There is no rebound.  Musculoskeletal: Normal range of motion.        General: No tenderness.     Right lower leg: No edema.     Left lower leg: No edema.  Skin:    Findings: No erythema or rash.  Neurological:     General: No focal deficit present.     Mental Status: He is alert and oriented to person, place, and time.     Cranial Nerves: No cranial nerve deficit.  Psychiatric:        Mood and Affect: Mood normal.    LABORATORY PANEL:  Male CBC Recent Labs  Lab 03/24/19 0530  WBC 6.3  HGB 12.0*  HCT 38.4*  PLT 249   ------------------------------------------------------------------------------------------------------------------ Chemistries  Recent Labs  Lab 03/23/19 1231 03/24/19 0530  NA 136 137  K 3.5 4.1  CL 103 107  CO2 23 18*  GLUCOSE 93 126*  BUN 30* 33*  CREATININE 2.24* 1.80*  CALCIUM 8.8* 8.2*  AST 87*  --   ALT 180*  --  ALKPHOS 67  --   BILITOT 2.8*  --    RADIOLOGY:  No results found. ASSESSMENT AND PLAN:   1.  Pulmonary embolism multifocal with possibility of pulmonary infarct.  Hypercoagulable work-up sent off.  Continue Heparin drip.  Patient has a chronic DVT right lower extremity per venous duplex.  Follow-up echocardiograph. 2.  Acute kidney injury likely on chronic kidney disease stage III.  Hold losartan. Continue IV fluid hydration since the patient received IV contrast for CT scan of the chest 3.  Hypertension.  Hold losartan and continue metoprolol. 4.  GERD on Protonix 5.  Knee pain  has gone away hold off any further anti-inflammatories  All the records are reviewed and case discussed with Care Management/Social Worker. Management plans discussed with the patient, family and they are in agreement.  CODE STATUS: Full Code  TOTAL TIME TAKING CARE OF THIS PATIENT: 32 minutes.   More than 50% of the time was spent in counseling/coordination of care: YES  POSSIBLE D/C IN 2 DAYS, DEPENDING ON CLINICAL CONDITION.   Demetrios Loll M.D on 03/24/2019 at 2:45 PM  Between 7am to 6pm - Pager - [phone]  After 6pm go to [email or web address] - [facility]

## 2019-03-24 NOTE — Progress Notes (Signed)
ANTICOAGULATION CONSULT NOTE - Initial Consult  Pharmacy Consult for Heparin Indication: pulmonary embolus  No Known Allergies  Patient Measurements: Height: 5\' 10"  (177.8 cm) Weight: 208 lb (94.3 kg) IBW/kg (Calculated) : 73 Heparin Dosing Weight: 92.2 kg  Vital Signs: Temp: 98.3 F (36.8 C) (07/11 1038) Temp Source: Oral (07/11 1038) BP: 134/109 (07/11 1201) Pulse Rate: 109 (07/11 1201)  Labs: Recent Labs    03/23/19 1231 03/23/19 2235 03/24/19 0530 03/24/19 1353  HGB 12.8*  --  12.0*  --   HCT 41.1  --  38.4*  --   PLT 235  --  249  --   APTT 35  --   --   --   LABPROT 17.0*  --   --   --   INR 1.4*  --   --   --   HEPARINUNFRC  --  0.20* 0.24* 0.39  CREATININE 2.24*  --  1.80*  --     Estimated Creatinine Clearance: 58.5 mL/min (A) (by C-G formula based on SCr of 1.8 mg/dL (H)).   Assessment: 48 yo male here with PE. No oral anticoagulants noted on PTA med list.    7/10 Heparin infusion started @ 1550 units/hr.  7/10 @ 2235 HL: 0.20. Level is subtherapeutic.infusion increased to 1750 units/hr 7/11 @0530  HL 0.24 1400 unit IV x 1 and infusion increased to 1950 units/hr.  7/11 @1353  HL 0.39.   Goal of Therapy:  Heparin level 0.3-0.7 units/ml Monitor platelets by anticoagulation protocol: Yes   Plan:  Heparin level is therapeutic.  Will continue infusion at 1950units/hr. Recheck Heparin level in 6h.  Continue to check HL and CBC daily while on heparin per protocol. CBC in AM  Oswald Hillock, PharmD, BCPS Clinical Pharmacist 03/24/2019 2:30 PM

## 2019-03-24 NOTE — Progress Notes (Signed)
ANTICOAGULATION CONSULT NOTE - Initial Consult  Pharmacy Consult for Heparin Indication: pulmonary embolus  No Known Allergies  Patient Measurements: Height: 5\' 10"  (177.8 cm) Weight: 208 lb (94.3 kg) IBW/kg (Calculated) : 73 Heparin Dosing Weight: 92.2 kg  Vital Signs: Temp: 99.4 F (37.4 C) (07/11 1958) Temp Source: Oral (07/11 1958) BP: 130/101 (07/11 1958) Pulse Rate: 102 (07/11 1958)  Labs: Recent Labs    03/23/19 1231  03/24/19 0530 03/24/19 1353 03/24/19 1953  HGB 12.8*  --  12.0*  --   --   HCT 41.1  --  38.4*  --   --   PLT 235  --  249  --   --   APTT 35  --   --   --   --   LABPROT 17.0*  --   --   --   --   INR 1.4*  --   --   --   --   HEPARINUNFRC  --    < > 0.24* 0.39 0.40  CREATININE 2.24*  --  1.80*  --   --    < > = values in this interval not displayed.    Estimated Creatinine Clearance: 58.5 mL/min (A) (by C-G formula based on SCr of 1.8 mg/dL (H)).   Assessment: 48 yo male here with PE. No oral anticoagulants noted on PTA med list.    7/10 Heparin infusion started @ 1550 units/hr.  7/10 @ 2235 HL: 0.20. Level is subtherapeutic.infusion increased to 1750 units/hr 7/11 @0530  HL 0.24 1400 unit IV x 1 and infusion increased to 1950 units/hr.  7/11 @1353  HL 0.39.  7/11 @1953  HL 0.40  Goal of Therapy:  Heparin level 0.3-0.7 units/ml Monitor platelets by anticoagulation protocol: Yes   Plan:  Heparin level is therapeutic.  Will continue infusion at 1950units/hr. Recheck Heparin level in 6h.  Continue to check HL and CBC daily while on heparin per protocol. CBC in AM  Pearla Dubonnet, PharmD Clinical Pharmacist 03/24/2019 8:37 PM

## 2019-03-24 NOTE — Progress Notes (Signed)
ANTICOAGULATION CONSULT NOTE - Initial Consult  Pharmacy Consult for Heparin Indication: pulmonary embolus  No Known Allergies  Patient Measurements: Height: 5\' 10"  (177.8 cm) Weight: 208 lb (94.3 kg) IBW/kg (Calculated) : 73 Heparin Dosing Weight: 92.2 kg  Vital Signs: Temp: 98.4 F (36.9 C) (07/11 0419) BP: 125/106 (07/11 0420) Pulse Rate: 106 (07/11 0420)  Labs: Recent Labs    03/23/19 1231 03/23/19 2235 03/24/19 0530  HGB 12.8*  --  12.0*  HCT 41.1  --  38.4*  PLT 235  --  249  APTT 35  --   --   LABPROT 17.0*  --   --   INR 1.4*  --   --   HEPARINUNFRC  --  0.20* 0.24*  CREATININE 2.24*  --  1.80*    Estimated Creatinine Clearance: 58.5 mL/min (A) (by C-G formula based on SCr of 1.8 mg/dL (H)).   Assessment: 48 yo male here with PE. No oral anticoagulants noted on PTA med list.    7/10 Heparin infusion started @ 1550 units/hr.  7/10 @ 2235 HL: 0.20. Level is subtherapeutic.infusion increased to 1750 units/hr  Goal of Therapy:  Heparin level 0.3-0.7 units/ml Monitor platelets by anticoagulation protocol: Yes   Plan:  7/11 @ 0530 HL: 0.24. Level is subtherapeutic.  Will order heparin bolus 1400 units IV x1  And increase heparin infusion to  1950units/hr. Recheck Heparin level in 6h after infusion rate change Continue to check HL and CBC daily while on heparin per protocol. CBC in AM  Pernell Dupre, PharmD, BCPS Clinical Pharmacist 03/24/2019 6:15 AM

## 2019-03-25 ENCOUNTER — Inpatient Hospital Stay (HOSPITAL_COMMUNITY)
Admit: 2019-03-25 | Discharge: 2019-03-25 | Disposition: A | Discharge status: Home / Self Care | Payer: Managed Care, Other (non HMO) | Attending: Internal Medicine | Admitting: Internal Medicine

## 2019-03-25 DIAGNOSIS — I361 Nonrheumatic tricuspid (valve) insufficiency: Secondary | ICD-10-CM

## 2019-03-25 DIAGNOSIS — I340 Nonrheumatic mitral (valve) insufficiency: Secondary | ICD-10-CM

## 2019-03-25 LAB — BASIC METABOLIC PANEL
Anion gap: 11 (ref 5–15)
BUN: 32 mg/dL — ABNORMAL HIGH (ref 6–20)
CO2: 21 mmol/L — ABNORMAL LOW (ref 22–32)
Calcium: 8.4 mg/dL — ABNORMAL LOW (ref 8.9–10.3)
Chloride: 105 mmol/L (ref 98–111)
Creatinine, Ser: 1.58 mg/dL — ABNORMAL HIGH (ref 0.61–1.24)
GFR calc Af Amer: 59 mL/min — ABNORMAL LOW (ref >60)
GFR calc non Af Amer: 51 mL/min — ABNORMAL LOW (ref >60)
Glucose, Bld: 87 mg/dL (ref 70–99)
Potassium: 4.7 mmol/L (ref 3.5–5.1)
Sodium: 137 mmol/L (ref 135–145)

## 2019-03-25 LAB — CBC
HCT: 43.2 % (ref 39.0–52.0)
Hemoglobin: 12.9 g/dL — ABNORMAL LOW (ref 13.0–17.0)
MCH: 27.6 pg (ref 26.0–34.0)
MCHC: 29.9 g/dL — ABNORMAL LOW (ref 30.0–36.0)
MCV: 92.3 fL (ref 80.0–100.0)
Platelets: 291 10*3/uL (ref 150–400)
RBC: 4.68 MIL/uL (ref 4.22–5.81)
RDW: 16 % — ABNORMAL HIGH (ref 11.5–15.5)
WBC: 7.6 10*3/uL (ref 4.0–10.5)
nRBC: 0 % (ref 0.0–0.2)

## 2019-03-25 LAB — ECHOCARDIOGRAM COMPLETE
Height: 70 in
Weight: 3428.59 oz

## 2019-03-25 LAB — HEPARIN LEVEL (UNFRACTIONATED): Heparin Unfractionated: 0.37 IU/mL (ref 0.30–0.70)

## 2019-03-25 LAB — MAGNESIUM: Magnesium: 1.7 mg/dL (ref 1.7–2.4)

## 2019-03-25 MED ORDER — HYDRALAZINE HCL 20 MG/ML IJ SOLN: 10.0000 mg | Freq: Four times a day (QID) | INTRAMUSCULAR | Status: DC | PRN

## 2019-03-25 NOTE — Progress Notes (Signed)
[facility] [place] NAME: David Carlson    MR#:  [id number]  DATE OF BIRTH:  09-Mar-1971  SUBJECTIVE:  CHIEF COMPLAINT:   Chief Complaint  Patient presents with  . Pulmonary Embolisum   Patient has better chest pain and shortness of breath. REVIEW OF SYSTEMS:  Review of Systems  Constitutional: Positive for malaise/fatigue. Negative for chills and fever.  HENT: Negative for sore throat.   Eyes: Negative for blurred vision and double vision.  Respiratory: Positive for cough. Negative for hemoptysis, sputum production, shortness of breath, wheezing and stridor.   Cardiovascular: Positive for chest pain. Negative for palpitations, orthopnea and leg swelling.  Gastrointestinal: Negative for abdominal pain, blood in stool, diarrhea, melena, nausea and vomiting.  Genitourinary: Negative for dysuria, flank pain and hematuria.  Musculoskeletal: Negative for back pain and joint pain.  Skin: Negative for rash.  Neurological: Negative for dizziness, sensory change, focal weakness, seizures, loss of consciousness, weakness and headaches.  Endo/Heme/Allergies: Negative for polydipsia.  Psychiatric/Behavioral: Negative for depression. The patient is not nervous/anxious.     DRUG ALLERGIES:  No Known Allergies VITALS:  Blood pressure (!) 116/92, pulse 88, temperature 97.9 F (36.6 C), temperature source Oral, resp. rate 18, height 5\' 10"  (1.778 m), weight 97.2 kg, SpO2 95 %. PHYSICAL EXAMINATION:  Physical Exam Constitutional:      General: He is not in acute distress. HENT:     Head: Normocephalic.     Mouth/Throat:     Mouth: Mucous membranes are moist.  Eyes:     General: No scleral icterus.    Conjunctiva/sclera: Conjunctivae normal.     Pupils: Pupils are equal, round, and reactive to light.  Neck:     Musculoskeletal: Normal range of motion and neck supple.     Vascular: No JVD.     Trachea: No tracheal deviation.   Cardiovascular:     Rate and Rhythm: Normal rate and regular rhythm.     Heart sounds: Normal heart sounds. No murmur. No gallop.   Pulmonary:     Effort: Pulmonary effort is normal. No respiratory distress.     Breath sounds: Normal breath sounds. No wheezing or rales.  Abdominal:     General: Bowel sounds are normal. There is no distension.     Palpations: Abdomen is soft.     Tenderness: There is no abdominal tenderness. There is no rebound.  Musculoskeletal: Normal range of motion.        General: No tenderness.     Right lower leg: No edema.     Left lower leg: No edema.  Skin:    Findings: No erythema or rash.  Neurological:     General: No focal deficit present.     Mental Status: He is alert and oriented to person, place, and time.     Cranial Nerves: No cranial nerve deficit.  Psychiatric:        Mood and Affect: Mood normal.    LABORATORY PANEL:  Male CBC Recent Labs  Lab 03/25/19 0502  WBC 7.6  HGB 12.9*  HCT 43.2  PLT 291   ------------------------------------------------------------------------------------------------------------------ Chemistries  Recent Labs  Lab 03/23/19 1231  03/25/19 0502  NA 136   < > 137  K 3.5   < > 4.7  CL 103   < > 105  CO2 23   < > 21*  GLUCOSE 93   < > 87  BUN 30*   < > 32*  CREATININE 2.24*   < > 1.58*  CALCIUM 8.8*   < > 8.4*  MG  --   --  1.7  AST 87*  --   --   ALT 180*  --   --   ALKPHOS 67  --   --   BILITOT 2.8*  --   --    < > = values in this interval not displayed.   RADIOLOGY:  No results found. ASSESSMENT AND PLAN:   1.  Pulmonary embolism multifocal with possibility of pulmonary infarct.  Hypercoagulable work-up sent off.  Continue Heparin drip.  Patient has a chronic DVT right lower extremity per venous duplex.  Follow-up echocardiograph.  I discussed with patient about oral anticoagulation options and side effects.  He prefers to take Eliquis or Xarelto. 2.  Acute kidney injury likely on chronic  kidney disease stage III.  Hold losartan. Improving with IV fluid hydration since the patient received IV contrast for CT scan of the chest 3.  Hypertension.  Hold losartan and continue metoprolol. 4.  GERD on Protonix 5.  Knee pain has gone away hold off any further anti-inflammatories  All the records are reviewed and case discussed with Care Management/Social Worker. Management plans discussed with the patient, family and they are in agreement.  CODE STATUS: Full Code  TOTAL TIME TAKING CARE OF THIS PATIENT: 28 minutes.   More than 50% of the time was spent in counseling/coordination of care: YES  POSSIBLE D/C IN 2 DAYS, DEPENDING ON CLINICAL CONDITION.   Demetrios Loll M.D on 03/25/2019 at 11:24 AM  Between 7am to 6pm - Pager - [phone]  After 6pm go to [email or web address] - [facility]

## 2019-03-25 NOTE — Progress Notes (Signed)
ANTICOAGULATION CONSULT NOTE - Initial Consult  Pharmacy Consult for Heparin Indication: pulmonary embolus  No Known Allergies  Patient Measurements: Height: 5\' 10"  (177.8 cm) Weight: 208 lb (94.3 kg) IBW/kg (Calculated) : 73 Heparin Dosing Weight: 92.2 kg  Vital Signs: Temp: 98 F (36.7 C) (07/12 0526) Temp Source: Oral (07/12 0526) BP: 133/105 (07/12 0526) Pulse Rate: 91 (07/12 0526)  Labs: Recent Labs    03/23/19 1231  03/24/19 0530 03/24/19 1353 03/24/19 1953 03/25/19 0502  HGB 12.8*  --  12.0*  --   --  12.9*  HCT 41.1  --  38.4*  --   --  43.2  PLT 235  --  249  --   --  291  APTT 35  --   --   --   --   --   LABPROT 17.0*  --   --   --   --   --   INR 1.4*  --   --   --   --   --   HEPARINUNFRC  --    < > 0.24* 0.39 0.40 0.37  CREATININE 2.24*  --  1.80*  --   --   --    < > = values in this interval not displayed.    Estimated Creatinine Clearance: 58.5 mL/min (A) (by C-G formula based on SCr of 1.8 mg/dL (H)).   Assessment: 48 yo male here with PE. No oral anticoagulants noted on PTA med list.    7/10 Heparin infusion started @ 1550 units/hr.  7/10 @ 2235 HL: 0.20. Level is subtherapeutic.infusion increased to 1750 units/hr 7/11 @0530  HL 0.24 1400 unit IV x 1 and infusion increased to 1950 units/hr.  7/11 @1353  HL 0.39.  7/11 @1953  HL 0.40  Goal of Therapy:  Heparin level 0.3-0.7 units/ml Monitor platelets by anticoagulation protocol: Yes   Plan:  7/12 @ 0502 Heparin level 0.37. Level reamins therapeutic.  Will continue infusion at 1950units/hr. Continue to check HL and CBC daily while on heparin per protocol. CBC in AM  Pernell Dupre, PharmD Clinical Pharmacist 03/25/2019 5:40 AM

## 2019-03-25 NOTE — Progress Notes (Signed)
*  PRELIMINARY RESULTS* Echocardiogram 2D Echocardiogram has been performed.  David Carlson 03/25/2019, 10:41 AM

## 2019-03-26 DIAGNOSIS — R778 Other specified abnormalities of plasma proteins: Secondary | ICD-10-CM

## 2019-03-26 DIAGNOSIS — I5021 Acute systolic (congestive) heart failure: Secondary | ICD-10-CM

## 2019-03-26 DIAGNOSIS — I2699 Other pulmonary embolism without acute cor pulmonale: Secondary | ICD-10-CM

## 2019-03-26 DIAGNOSIS — R7989 Other specified abnormal findings of blood chemistry: Secondary | ICD-10-CM

## 2019-03-26 LAB — CBC
HCT: 37.4 % — ABNORMAL LOW (ref 39.0–52.0)
Hemoglobin: 11.5 g/dL — ABNORMAL LOW (ref 13.0–17.0)
MCH: 27 pg (ref 26.0–34.0)
MCHC: 30.7 g/dL (ref 30.0–36.0)
MCV: 87.8 fL (ref 80.0–100.0)
Platelets: 280 10*3/uL (ref 150–400)
RBC: 4.26 MIL/uL (ref 4.22–5.81)
RDW: 16 % — ABNORMAL HIGH (ref 11.5–15.5)
WBC: 6.2 10*3/uL (ref 4.0–10.5)
nRBC: 0 % (ref 0.0–0.2)

## 2019-03-26 LAB — LIPID PANEL
Cholesterol: 69 mg/dL (ref 0–200)
HDL: 12 mg/dL — ABNORMAL LOW (ref >40)
LDL Cholesterol: 46 mg/dL (ref 0–99)
Total CHOL/HDL Ratio: 5.8 RATIO
Triglycerides: 53 mg/dL (ref <150)
VLDL: 11 mg/dL (ref 0–40)

## 2019-03-26 LAB — BASIC METABOLIC PANEL
Anion gap: 9 (ref 5–15)
BUN: 25 mg/dL — ABNORMAL HIGH (ref 6–20)
CO2: 19 mmol/L — ABNORMAL LOW (ref 22–32)
Calcium: 7.9 mg/dL — ABNORMAL LOW (ref 8.9–10.3)
Chloride: 105 mmol/L (ref 98–111)
Creatinine, Ser: 1.09 mg/dL (ref 0.61–1.24)
GFR calc Af Amer: >60 mL/min (ref >60)
GFR calc non Af Amer: >60 mL/min (ref >60)
Glucose, Bld: 93 mg/dL (ref 70–99)
Potassium: 4.1 mmol/L (ref 3.5–5.1)
Sodium: 133 mmol/L — ABNORMAL LOW (ref 135–145)

## 2019-03-26 LAB — CARDIOLIPIN ANTIBODIES, IGG, IGM, IGA
Anticardiolipin IgA: <9 APL U/mL (ref 0–11)
Anticardiolipin IgG: <9 GPL U/mL (ref 0–14)
Anticardiolipin IgM: <9 MPL U/mL (ref 0–12)

## 2019-03-26 LAB — HEPARIN LEVEL (UNFRACTIONATED)
Heparin Unfractionated: 0.23 IU/mL — ABNORMAL LOW (ref 0.30–0.70)
Heparin Unfractionated: 0.34 IU/mL (ref 0.30–0.70)
Heparin Unfractionated: 0.38 IU/mL (ref 0.30–0.70)

## 2019-03-26 LAB — BILIRUBIN, TOTAL: Total Bilirubin: 2.2 mg/dL — ABNORMAL HIGH (ref 0.3–1.2)

## 2019-03-26 LAB — PROTEIN C, TOTAL: Protein C, Total: 39 % — ABNORMAL LOW (ref 60–150)

## 2019-03-26 MED ORDER — SODIUM CHLORIDE 0.9 % IV SOLN: 250.0000 mL | INTRAVENOUS | Status: DC | PRN

## 2019-03-26 MED ORDER — ASPIRIN 81 MG PO CHEW
81.0000 mg | CHEWABLE_TABLET | ORAL | Status: AC
  Administered 2019-03-27: 06:00:00 81 mg via ORAL
  Filled 2019-03-26: qty 1

## 2019-03-26 MED ORDER — HEPARIN BOLUS VIA INFUSION
1400.0000 [IU] | Freq: Once | INTRAVENOUS | Status: AC
  Administered 2019-03-26: 06:00:00 1400 [IU] via INTRAVENOUS
  Filled 2019-03-26: qty 1400

## 2019-03-26 MED ORDER — SODIUM CHLORIDE 0.9% FLUSH: 3.0000 mL | INTRAVENOUS | Status: DC | PRN

## 2019-03-26 MED ORDER — SODIUM CHLORIDE 0.9 % IV SOLN
INTRAVENOUS | Status: DC
  Administered 2019-03-27 (×2): via INTRAVENOUS

## 2019-03-26 NOTE — Progress Notes (Signed)
ANTICOAGULATION CONSULT NOTE   Pharmacy Consult for Heparin Indication: pulmonary embolus  No Known Allergies  Patient Measurements: Height: 5\' 10"  (177.8 cm) Weight: 214 lb 4.6 oz (97.2 kg) IBW/kg (Calculated) : 73 Heparin Dosing Weight: 92.2 kg  Vital Signs: Temp: 97.7 F (36.5 C) (07/13 1217) Temp Source: Oral (07/13 1217) BP: 119/79 (07/13 1217) Pulse Rate: 91 (07/13 1217)  Labs: Recent Labs    03/24/19 0530  03/25/19 0502 03/26/19 0454 03/26/19 1222 03/26/19 1809  HGB 12.0*  --  12.9* 11.5*  --   --   HCT 38.4*  --  43.2 37.4*  --   --   PLT 249  --  291 280  --   --   HEPARINUNFRC 0.24*   < > 0.37 0.23* 0.34 0.38  CREATININE 1.80*  --  1.58*  --  1.09  --    < > = values in this interval not displayed.    Estimated Creatinine Clearance: 98 mL/min (by C-G formula based on SCr of 1.09 mg/dL).   Assessment: 48 yo male here with PE. No oral anticoagulants noted on PTA med list.    7/10 Heparin infusion started @ 1550 units/hr.  7/10 @ 2235 HL: 0.20. Level is subtherapeutic.infusion increased to 1750 units/hr 7/11 @0530  HL 0.24 1400 unit IV x 1 and infusion increased to 1950 units/hr.  7/11 @1353  HL 0.39.  7/11 @1953  HL 0.40 7/12 @ 0502 HL 0.37 7/13 @ 0454 Heparin level 0.23. increase infusion rate to 2150units/hr. 7/13 @ 1222 HL 0.34   Goal of Therapy:  Heparin level 0.3-0.7 units/ml Monitor platelets by anticoagulation protocol: Yes   Plan:  7/13 1809 HL 0.38 therapeutic. HL now therapeutic x 2. Continue heparin drip at 2150 units/hr. Will order HL and CBC with morning labs.   Tawnya Crook, PharmD Clinical Pharmacist 03/26/2019 7:34 PM

## 2019-03-26 NOTE — H&P (View-Only) (Signed)
Cardiology Consultation:   Patient ID: David Carlson MRN: [id number]; DOB: May 23, 1971  Admit date: 03/23/2019 Date of Consult: 03/26/2019  Primary Care Provider: Arnetha Courser, MD Primary Cardiologist: Ida Rogue, MD  Primary Electrophysiologist:  None    Patient Profile:   David Carlson is a 48 y.o. male with a hx of palpitations, hypertension, hyperlipidemia, obesity, GERD, anxiety, PVCs, and erectile dysfunction, who is being seen today for the evaluation of HFrEF / cardiomyopathy with EF 25% at the request of Dr. Bridgett Larsson.  History of Present Illness:   Mr. Celaya is a 48 year old male with PMH as above.  He has a history of palpitations that dates back to age 76.  He underwent stress testing while incarcerated in [place] in 2008 and this was normal.  He was told that he was having PVCs.  After losing some weight, he had resolutions of palpitations.  In 2018, he noted recurrent palpitations.  Echo showed normal LV function with diastolic dysfunction.  48-hour Holter monitoring showed frequent PVCs accounting for 5.4% of total beats.  He was treated with beta-blocker therapy at that time with improvement in his symptoms.    Since that time, he has quit smoking and drinking and his palpitations have remained under control with beta-blocker.  He reportedly had some dyspnea on exertion after a respiratory illness this past winter and for which he received abx.  In the setting of the COVID-19 pandemic, he reportedly did a lot of lying around during the month of April 2020. He returned to work and continued to note dyspnea with minimal activity.  In early June, he noted left calf swelling above his sock line without pain or tenderness.  Since that time, his dyspnea has progressed. He was seen by primary care on February 26, 2019 and noted to be in sinus tachycardia with anterolateral T wave inversion and was referred back to cardiology.  Approximately 2 weeks ago, his dyspnea had  progressed so he was only able to walk 30 feet before taking a break.  He also noted intermittent chest pressure for approximately 4-6 weeks that occurred specifically when lifting a 50lb plate off of some of his equipment at work but not with otheractivities. He was seen by [facility] cardiology for follow-up of a 6-week history of dyspnea and left lower extremity swelling on 03/24/2019. At that time, he felt his dyspnea was slightly improved though his left leg remained swollen. No PND, orthopnea, dizziness, syncope, or early satiety. He was not in respiratory distress at that time with normal pulse ox on room air.  Concern was for DVT / PE, though no palpable cord was felt on exam. While in the clinic, LE [place] was obtained to r/o DVT and did not show anything on the L side but did show chronic clot on the right lower extremity. He was then sent for STAT CTA of the chest to r/o PE. This showed multifocal occlusive segmental to subsegmental bilateral lower lobe emboli with concern for developing pulmonary infarction. The patient was advised to present to the ED form [facility] radiology for anticoagulation and further workup, possibly vascular evaluation for TPA. In the ED, BP 131/100, HR 107.  Labs significant for creatinine 2.24, BUN 30, AST 87, ALT 180.   At the time of cardiology consultation, no current CP. He reported that, at its worst, his CP was rated 8/10 but was currently 0/10; however, he stated it may increase if he was asked to pick something up. He  further described his chest pain as non-radiating chest pressure that occurred when lifting objects, even when lifting as light as 2lbs. He stated this chest pressure "moved like a wave that spread from head to chest and made [his] head feel like it would explode." He also reported recent orthopnea over the last few weeks, stating he was unable to get sleep now for weeks upon end. He noted continued SOB/DOE that prevented him from walking even short distances. He  stated his left sided LEE had improved; however, now he felt right sided pain along his IT band that was somewhat tender to palpation and had recently started as of this AM 7/13. He stated that, over the last year, he had frequent coughing episodes that felt better after he produced clear phlegm but otherwise denied any nausea and denied emesis. He also denied any recent syncope, though he did note occasional bilateral upper extremity numbness and weakness with his chest pressure. He continued to report quitting smoking and drinking and denied any illegal drug use.   Heart Pathway Score:     Past Medical History:  Diagnosis Date   Anxiety    Broken shoulder    Collar bone fracture    x 2   Erectile dysfunction    GERD (gastroesophageal reflux disease)    H/O Bell's palsy    Hyperlipidemia    Hypertension    Obesity    Palpitations    a. Previously evaluted when he was incarcerated in [place] - reportedly nl stress test.   PVC's (premature ventricular contractions)    a. 04/2013 Echo: EF 50-55%, Gr1 DD, mildly dil LA;  b. 04/2017 48hr holter: freq PVCs (5.4%), rare PACs. Single 13 beat atrial run - 143 bpm.    Past Surgical History:  Procedure Laterality Date   CARPAL TUNNEL RELEASE Bilateral    HERNIA REPAIR  2015   HIP SURGERY     a. Age 65 - failure of growth plate to close - Pins inserted     Home Medications:  Prior to Admission medications   Medication Sig Start Date End Date Taking? Authorizing Provider  albuterol (VENTOLIN HFA) 108 (90 Base) MCG/ACT inhaler Inhale 2 puffs into the lungs every 6 (six) hours as needed for wheezing or shortness of breath. 01/30/19  Yes Hubbard Hartshorn, FNP  atorvastatin (LIPITOR) 20 MG tablet Take 1 tablet (20 mg total) by mouth daily at 6 PM. 10/13/18  Yes Lada, Satira Anis, MD  cyclobenzaprine (FLEXERIL) 10 MG tablet Take 10 mg by mouth 3 (three) times daily as needed. for muscle spams 08/27/18  Yes [provider]  fluticasone  (FLONASE) 50 MCG/ACT nasal spray Place 2 sprays into both nostrils daily. 01/30/19  Yes Hubbard Hartshorn, FNP  losartan (COZAAR) 50 MG tablet Take 1 tablet by mouth once daily 03/20/19  Yes Poulose, Bethel Born, NP  metoprolol tartrate (LOPRESSOR) 25 MG tablet Take 1 tablet (25 mg total) by mouth 2 (two) times daily. 10/13/18 03/23/19 Yes Lada, Satira Anis, MD  pantoprazole (PROTONIX) 40 MG tablet Take 1 tablet (40 mg total) by mouth daily as needed. Caution:prolonged use may cause health problems; appt needed Patient taking differently: Take 40 mg by mouth daily as needed (GERD symptoms). Caution:prolonged use may cause health problems; appt needed 10/13/18  Yes Lada, Satira Anis, MD    Inpatient Medications: Scheduled Meds:  atorvastatin  20 mg Oral q1800   fluticasone  2 spray Each Nare Daily   metoprolol tartrate  50 mg  Oral BID   Continuous Infusions:  heparin 2,150 Units/hr (03/26/19 0548)   PRN Meds: albuterol, hydrALAZINE, ondansetron **OR** ondansetron (ZOFRAN) IV, oxyCODONE, pantoprazole  Allergies:   No Known Allergies  Social History:   Social History   Socioeconomic History   Marital status: Single    Spouse name: Not on file   Number of children: 2   Years of education: 12   Highest education level: High school graduate  Occupational History   Not on file  Social Needs   Financial resource strain: Not hard at all   Food insecurity    Worry: Never true    Inability: Never true   Transportation needs    Medical: No    Non-medical: Not on file  Tobacco Use   Smoking status: Former Smoker    Packs/day: 1.00    Years: 20.00    Pack years: 20.00    Types: Cigarettes    Quit date: 09/15/2015    Years since quitting: 3.5   Smokeless tobacco: Never Used  Substance and Sexual Activity   Alcohol use: Yes    Alcohol/week: 0.0 standard drinks    Comment: 3-6 cans/beer on weeknights, 6-12 beers on weekends.   Drug use: No   Sexual activity: Yes  Lifestyle    Physical activity    Days per week: 0 days    Minutes per session: 0 min   Stress: Not at all  Relationships   Social connections    Talks on phone: More than three times a week    Gets together: Once a week    Attends religious service: Never    Active member of club or organization: No    Attends meetings of clubs or organizations: Never    Relationship status: Divorced   Intimate partner violence    Fear of current or ex partner: Patient refused    Emotionally abused: Patient refused    Physically abused: Patient refused    Forced sexual activity: Patient refused  Other Topics Concern   Not on file  Social History Narrative   Lives in [place] with his ex-girlfriend's brother.  Works @ [employer] in [employer].  Active @ work w/o limitations but does not routinely exercise.    Family History:    Family History  Problem Relation Age of Onset   Diabetes Mother    Hypertension Father    Heart disease Maternal Grandmother    Heart attack Maternal Grandmother    Diabetes Maternal Grandfather    Diabetes Paternal Grandfather      ROS:  Please see the history of present illness.  Review of Systems  Constitutional: Positive for malaise/fatigue.  HENT:       Multiple recent colds  Respiratory: Positive for cough, sputum production and shortness of breath. Negative for hemoptysis.        Clear sputum with coughing episodes  Cardiovascular: Positive for chest pain, orthopnea and leg swelling. Negative for palpitations.       Chest pressure with lifting objects. Not current. 8/10 at its worst. LEE, improving. Palpitations, improved with BB. Recent orthopnea.  Gastrointestinal: Negative for abdominal pain, blood in stool, constipation, diarrhea, melena, nausea and vomiting.  Genitourinary: Negative for dysuria, frequency, hematuria and urgency.  Neurological: Positive for tingling, weakness and headaches. Negative for loss of consciousness.       Following  lifting objects, felt chest pressure then head pressure / headache. Also felt bilateral UE numbness and tingling  Psychiatric/Behavioral: The patient has insomnia.  Insomnia d/t inability to be comfortable  All other systems reviewed and are negative.   All other ROS reviewed and negative.     Physical Exam/Data:   Vitals:   03/25/19 1121 03/25/19 1553 03/25/19 2115 03/26/19 0456  BP: (!) 116/92 111/88 122/89 (!) 126/92  Pulse: 88 89 94 87  Resp: 18 18  18   Temp: 97.9 F (36.6 C) 97.7 F (36.5 C) 97.9 F (36.6 C) 97.8 F (36.6 C)  TempSrc: Oral Oral Oral Oral  SpO2: 95% 95% 95% 98%  Weight:      Height:        Intake/Output Summary (Last 24 hours) at 03/26/2019 0804 Last data filed at 03/26/2019 0300 Gross per 24 hour  Intake 409.28 ml  Output --  Net 409.28 ml   Last 3 Weights 03/25/2019 03/23/2019 03/23/2019  Weight (lbs) 214 lb 4.6 oz 208 lb 208 lb 1.9 oz  Weight (kg) 97.2 kg 94.348 kg 94.403 kg     Body mass index is 30.75 kg/m.  General:  Well nourished, well developed, in no acute distress. Eating frosted flakes. HEENT: normal Neck: No JVD Vascular: No carotid bruits; radial pulses 1+ bilaterally Cardiac:  normal S1, S2; tachycardic but regular; no murmur  Lungs:  clear to auscultation bilaterally, no wheezing, rhonchi or rales  Abd: soft, nontender, no hepatomegaly  Ext: no lower extremity edema Musculoskeletal:  No deformities Skin: warm and dry  Neuro:  No focal abnormalities noted Psych:  Normal affect   EKG:  The EKG was personally reviewed and demonstrates:  No new tracings since office visit EKG - ST, 108bpm, anterolateral TWI new since 06/2017 Telemetry:  Telemetry was personally reviewed and demonstrates:  SR, PVCs, rate 80-100s bpm, 3PVCs / NSVT at 15:58:47 on 7/12   Relevant CV Studies: 03/25/2019 TTE  1. The left ventricle has severely reduced systolic function, with an ejection fraction of 20-25%. The cavity size was mildly dilated. Left  ventricular diastolic Doppler parameters are consistent with restrictive filling. Left ventrical global  hypokinesis without regional wall motion abnormalities.  2. The right ventricle has mildly reduced systolic function. The cavity was normal. There is no increase in right ventricular wall thickness. Right ventricular systolic pressure is mild to moderately elevated with an estimated pressure of 42.5 mmHg.  3. Mitral valve regurgitation is mild to moderate.  4. Tricuspid valve regurgitation is mild-moderate.  5. Left atrial size was moderately dilated.  6. Right atrial size was mildly dilated.  7. Small pericardial effusion outside the LV free wall.  Laboratory Data:  High Sensitivity Troponin:  No results for input(s): TROPONINIHS in the last 720 hours.   Cardiac EnzymesNo results for input(s): TROPONINI in the last 168 hours. No results for input(s): TROPIPOC in the last 168 hours.  Chemistry Recent Labs  Lab 03/23/19 1231 03/24/19 0530 03/25/19 0502  NA 136 137 137  K 3.5 4.1 4.7  CL 103 107 105  CO2 23 18* 21*  GLUCOSE 93 126* 87  BUN 30* 33* 32*  CREATININE 2.24* 1.80* 1.58*  CALCIUM 8.8* 8.2* 8.4*  GFRNONAA 34* 44* 51*  GFRAA 39* 51* 59*  ANIONGAP 10 12 11     Recent Labs  Lab 03/23/19 1231 03/26/19 0454  PROT 7.1  --   ALBUMIN 3.5  --   AST 87*  --   ALT 180*  --   ALKPHOS 67  --   BILITOT 2.8* 2.2*   Hematology Recent Labs  Lab 03/24/19 0530 03/25/19 0502 03/26/19 9373  WBC 6.3 7.6 6.2  RBC 4.27 4.68 4.26  HGB 12.0* 12.9* 11.5*  HCT 38.4* 43.2 37.4*  MCV 89.9 92.3 87.8  MCH 28.1 27.6 27.0  MCHC 31.3 29.9* 30.7  RDW 16.1* 16.0* 16.0*  PLT 249 291 280   BNPNo results for input(s): BNP, PROBNP in the last 168 hours.  DDimer No results for input(s): DDIMER in the last 168 hours.   Radiology/Studies:  Ct Angio Chest Pe W Or Wo Contrast  Addendum Date: 03/23/2019   ADDENDUM REPORT: 03/23/2019 12:20 ADDENDUM: These results were called by telephone at  the time of interpretation on 03/23/2019 at 12:11 pm to Dr. Murray Hodgkins , who verbally acknowledged these results. Electronically Signed   By: Eddie Candle M.D.   On: 03/23/2019 12:20   Result Date: 03/23/2019 CLINICAL DATA:  Shortness of breath, tachycardia EXAM: CT ANGIOGRAPHY CHEST WITH CONTRAST TECHNIQUE: Multidetector CT imaging of the chest was performed using the standard protocol during bolus administration of intravenous contrast. Multiplanar CT image reconstructions and MIPs were obtained to evaluate the vascular anatomy. CONTRAST:  34mL OMNIPAQUE IOHEXOL 350 MG/ML SOLN COMPARISON:  None. FINDINGS: Cardiovascular: Satisfactory opacification of the pulmonary arteries to the segmental level. Positive examination for pulmonary embolism with multifocal occlusive segmental to subsegmental embolus present bilaterally, most notably in the bilateral lower lobes (series 5, image 145). Cardiomegaly, with enlarged right ventricle although preserved RV LV ratio, less than 0.8. No pericardial effusion. Mediastinum/Nodes: No enlarged mediastinal, hilar, or axillary lymph nodes. Thyroid gland, trachea, and esophagus demonstrate no significant findings. Lungs/Pleura: Small bilateral pleural effusions and mild, diffuse bilateral interlobular septal thickening. There is ground-glass and consolidative opacity of left lung base and to a lesser extent the lateral lateral segment right middle lobe and lateral segment right lower lobe, distal to embolus and a concerning for developing pulmonary infarction (series 6, image 59). Upper Abdomen: No acute abnormality. Musculoskeletal: No chest wall abnormality. No acute or significant osseous findings. Review of the MIP images confirms the above findings. IMPRESSION: 1. Positive examination for pulmonary embolism with multifocal occlusive segmental to subsegmental embolus present bilaterally, most notably in the bilateral lower lobes (series 5, image 145). 2. There is  ground-glass and consolidative opacity of left lung base and to a lesser extent the lateral lateral segment right middle lobe and lateral segment right lower lobe, distal to embolus and a concerning for developing pulmonary infarction (series 6, image 59). 3. Pleural effusions and diffuse bilateral interlobular septal thickening, likely edema. 4. Cardiomegaly, with enlarged right ventricle although preserved RV LV ratio, less than 0.8. Correlate with clinical and echocardiographic evidence of right heart strain. These results were called by telephone at the time of interpretation, final communication will be documented in addendum. Electronically Signed: By: Eddie Candle M.D. On: 03/23/2019 12:06   Vas [place] Lower Extremity Venous (dvt)  Result Date: 03/24/2019  Lower Venous Study Other Indications: Swelling and mild tenderness of the left calf for 6 weeks.                    Dyspnea for 6 months, progressing 6 weeks ago. Risk Factors: None identified. Comparison Study: None Performing Technologist: Pilar Jarvis RDMS, RVT, RDCS  Examination Guidelines: A complete evaluation includes B-mode imaging, spectral Doppler, color Doppler, and power Doppler as needed of all accessible portions of each vessel. Bilateral testing is considered an integral part of a complete examination. Limited examinations for reoccurring indications may be performed as noted.  +---------+---------------+---------+-----------+----------+-------+  RIGHT  Compressibility Phasicity Spontaneity Properties Summary  +---------+---------------+---------+-----------+----------+-------+  CFV       Full            Yes       Yes                             +---------+---------------+---------+-----------+----------+-------+  SFJ       Full            Yes       Yes                             +---------+---------------+---------+-----------+----------+-------+  FV Prox   Full            Yes       Yes                              +---------+---------------+---------+-----------+----------+-------+  FV Mid    Full            Yes       Yes                             +---------+---------------+---------+-----------+----------+-------+  FV Distal Full            Yes       Yes                             +---------+---------------+---------+-----------+----------+-------+  PFV       Full            Yes       Yes                             +---------+---------------+---------+-----------+----------+-------+  POP       Full            Yes       Yes                             +---------+---------------+---------+-----------+----------+-------+  PTV       Full            Yes       Yes                             +---------+---------------+---------+-----------+----------+-------+  PERO      None            No        No          retracted  Chronic  +---------+---------------+---------+-----------+----------+-------+  Soleal    Full            Yes       Yes                             +---------+---------------+---------+-----------+----------+-------+  Gastroc   Full            Yes       Yes                             +---------+---------------+---------+-----------+----------+-------+  GSV       Full            Yes       Yes                             +---------+---------------+---------+-----------+----------+-------+  SSV       Full            Yes       Yes                             +---------+---------------+---------+-----------+----------+-------+ Both peroneal veins appear diminutive and atrophied as opposed to dilated. Both veins contain highly echogenic material.  +---------+---------------+---------+-----------+----------+-------+  LEFT      Compressibility Phasicity Spontaneity Properties Summary  +---------+---------------+---------+-----------+----------+-------+  CFV       Full            Yes       Yes                             +---------+---------------+---------+-----------+----------+-------+  SFJ       Full            Yes        Yes                             +---------+---------------+---------+-----------+----------+-------+  FV Prox   Full            Yes       Yes                             +---------+---------------+---------+-----------+----------+-------+  FV Mid    Full            Yes       Yes                             +---------+---------------+---------+-----------+----------+-------+  FV Distal Full            Yes       Yes                             +---------+---------------+---------+-----------+----------+-------+  PFV       Full            Yes       Yes                             +---------+---------------+---------+-----------+----------+-------+  POP       Full            Yes       Yes                             +---------+---------------+---------+-----------+----------+-------+  PTV       Full            Yes       Yes                             +---------+---------------+---------+-----------+----------+-------+  PERO  Full            Yes       Yes                             +---------+---------------+---------+-----------+----------+-------+  Soleal    Full            Yes       Yes                             +---------+---------------+---------+-----------+----------+-------+  Gastroc   Full            Yes       Yes                             +---------+---------------+---------+-----------+----------+-------+  GSV       Full            Yes       Yes                             +---------+---------------+---------+-----------+----------+-------+  SSV       Full            Yes       Yes                             +---------+---------------+---------+-----------+----------+-------+   Summary: Right: No reflux was noted in the common femoral vein. Findings consistent with chronic deep vein thrombosis involving the right peroneal veins. All other veins visualized appear fully compressible and demonstrate appropriate Doppler characteristics.  Left: No evidence of deep vein thrombosis in the lower  extremity. No indirect evidence of obstruction proximal to the inguinal ligament.  *See table(s) above for measurements and observations. Electronically signed by Ida Rogue MD on 03/24/2019 at 8:58:58 AM.    Final     Assessment and Plan:   Cardiomyopathy, Acute on chronic HFrEF (EF 20-25%) --No SOB/DOE when lying comfortably in bed. Does report 4-6 weeks of intermittent CP, specifically with lifting objects, even light objects.  --Echo as above shows severely reduced EF 22-97%, diastolic doppler shows restrictive filling, LV global hypokinesis without RWMA. RV reduced systolic function. RVSP 42.106mmHg. Mild to moderate MR. Mild to moderate TR. Moderate LAE. Mild RAE. Small pericardial effusion.  --Not volume overloaded on exam.  --Plan for further ischemic evaluation, given chest pressure, severely reduced EF of 20-25% and LV global hypokinesis, and risk factors for CAD including past smoker, HTN, alcohol use, and HLD.  --Pending renal function / updated BMET, will tentatively plan for left heart cardiac catheterization to take place tomorrow 7/14. No plan for right heart catheterization, given bilateral pulmonary embolisms on CTA. Baseline Cr appears ~0.90-0.95 with most recent Cr yesterday 7/12 was 1.58 and pending today's labs.  --Continue heparin. Given plan for cardiac cath, do not start po anticoagulation for bilateral pulmonary embolisms (as below) yet. --NPO after midnight in preparation for possible cardiac catheterization.  --Risks and benefits of cardiac catheterization have been discussed with the patient.  These include bleeding, infection, kidney damage, stroke, heart attack, death.  The patient understands these risks and is willing to proceed. --Following catheterization, patient will likely need guideline directed therapy to include ASA, BB, statin, Entresto, and spironolactone with further recommendations following  cath results. Of note, PTA medications included Lipitor 20mg   daily, lopressor 25mg  BID, and losartan 50mg  daily. Will order baseline lipid panel and recommend high intensity statin at Lipitor 80mg  daily. Given low EF, recommend transition losartan to Entresto as tolerated per guidelines. He will also need to follow-up in the office within two weeks of discharge, as well as close monitoring of renal function. He will also need follow-up echo to monitor EF.   Bilateral Pulmonary Embolism  --Dyspnea on exertion x6 weeks with associated chest pressure and LLE edema x4-6 weeks without tenderness and no palpable cord on exam. --Bilateral PE as above in CTA. Will need discharged on po anticoagulation and after cardiac cath with either Eliquis or Xarelto. Continue heparin for now in preparation for cath. Remainder as above.   Chest pressure --No current chest pressure. As above, chest pressure with lifting even light objects and severe DOE in setting of bilateral PE. --Several risk factors for cardiac etiology, including past history of smoking. Echo concerning with EF 20-25%, LV global hypokinesis. EKG with new anterolateral TWI. --Hgb A1C 5.5. Pending updated LDL with previous LDL in 2017 113. --Plan for further ischemic workup with [facility], tentatively scheduled for 7/14.  --As above, will likely need guideline directed therapy to include ASA, BB, high intensity statin, Entresto (transition from ARB to [place]), and spironolactone with further recommendations pending LHC. Continue heparin. NPO after midnight.  Remainder as above.  HTN  - Continue medical management. Titrate BB as HR allows for BP control. Holding ARB in preparation for cath. Recommend transition from ARB to [facility] as tolerated d/t low EF.  PVCs/ Palpitations --Seen on telemetry. Sx controlled with BB. Continue.   HLD  --Continue statin. Recommend increased dose for high intensity statin. Follow-up labs as above.  AOCKD --Daily BMET  H/o tobacco abuse --Continued cessation  advised.    For questions or updates, please contact [place] Please consult [email or web address] for contact info under     Signed, Arvil Chaco, PA-C  03/26/2019 8:04 AM

## 2019-03-26 NOTE — Consult Note (Signed)
Cardiology Consultation:   Patient ID: David Carlson MRN: [id number]; DOB: 05/25/1971  Admit date: 03/23/2019 Date of Consult: 03/26/2019  Primary Care Provider: Arnetha Courser, MD Primary Cardiologist: Ida Rogue, MD  Primary Electrophysiologist:  None    Patient Profile:   NIEL PERETTI II is a 48 y.o. male with a hx of palpitations, hypertension, hyperlipidemia, obesity, GERD, anxiety, PVCs, and erectile dysfunction, who is being seen today for the evaluation of HFrEF / cardiomyopathy with EF 25% at the request of Dr. Bridgett Larsson.  History of Present Illness:   Mr. Ohanesian is a 48 year old male with PMH as above.  He has a history of palpitations that dates back to age 79.  He underwent stress testing while incarcerated in [place] in 2008 and this was normal.  He was told that he was having PVCs.  After losing some weight, he had resolutions of palpitations.  In 2018, he noted recurrent palpitations.  Echo showed normal LV function with diastolic dysfunction.  48-hour Holter monitoring showed frequent PVCs accounting for 5.4% of total beats.  He was treated with beta-blocker therapy at that time with improvement in his symptoms.    Since that time, he has quit smoking and drinking and his palpitations have remained under control with beta-blocker.  He reportedly had some dyspnea on exertion after a respiratory illness this past winter and for which he received abx.  In the setting of the COVID-19 pandemic, he reportedly did a lot of lying around during the month of April 2020. He returned to work and continued to note dyspnea with minimal activity.  In early June, he noted left calf swelling above his sock line without pain or tenderness.  Since that time, his dyspnea has progressed. He was seen by primary care on February 26, 2019 and noted to be in sinus tachycardia with anterolateral T wave inversion and was referred back to cardiology.  Approximately 2 weeks ago, his dyspnea had  progressed so he was only able to walk 30 feet before taking a break.  He also noted intermittent chest pressure for approximately 4-6 weeks that occurred specifically when lifting a 50lb plate off of some of his equipment at work but not with otheractivities. He was seen by [facility] cardiology for follow-up of a 6-week history of dyspnea and left lower extremity swelling on 03/24/2019. At that time, he felt his dyspnea was slightly improved though his left leg remained swollen. No PND, orthopnea, dizziness, syncope, or early satiety. He was not in respiratory distress at that time with normal pulse ox on room air.  Concern was for DVT / PE, though no palpable cord was felt on exam. While in the clinic, LE [place] was obtained to r/o DVT and did not show anything on the L side but did show chronic clot on the right lower extremity. He was then sent for STAT CTA of the chest to r/o PE. This showed multifocal occlusive segmental to subsegmental bilateral lower lobe emboli with concern for developing pulmonary infarction. The patient was advised to present to the ED form [facility] radiology for anticoagulation and further workup, possibly vascular evaluation for TPA. In the ED, BP 131/100, HR 107.  Labs significant for creatinine 2.24, BUN 30, AST 87, ALT 180.   At the time of cardiology consultation, no current CP. He reported that, at its worst, his CP was rated 8/10 but was currently 0/10; however, he stated it may increase if he was asked to pick something up. He  further described his chest pain as non-radiating chest pressure that occurred when lifting objects, even when lifting as light as 2lbs. He stated this chest pressure "moved like a wave that spread from head to chest and made [his] head feel like it would explode." He also reported recent orthopnea over the last few weeks, stating he was unable to get sleep now for weeks upon end. He noted continued SOB/DOE that prevented him from walking even short distances. He  stated his left sided LEE had improved; however, now he felt right sided pain along his IT band that was somewhat tender to palpation and had recently started as of this AM 7/13. He stated that, over the last year, he had frequent coughing episodes that felt better after he produced clear phlegm but otherwise denied any nausea and denied emesis. He also denied any recent syncope, though he did note occasional bilateral upper extremity numbness and weakness with his chest pressure. He continued to report quitting smoking and drinking and denied any illegal drug use.   Heart Pathway Score:     Past Medical History:  Diagnosis Date   Anxiety    Broken shoulder    Collar bone fracture    x 2   Erectile dysfunction    GERD (gastroesophageal reflux disease)    H/O Bell's palsy    Hyperlipidemia    Hypertension    Obesity    Palpitations    a. Previously evaluted when he was incarcerated in [place] - reportedly nl stress test.   PVC's (premature ventricular contractions)    a. 04/2013 Echo: EF 50-55%, Gr1 DD, mildly dil LA;  b. 04/2017 48hr holter: freq PVCs (5.4%), rare PACs. Single 13 beat atrial run - 143 bpm.    Past Surgical History:  Procedure Laterality Date   CARPAL TUNNEL RELEASE Bilateral    HERNIA REPAIR  2015   HIP SURGERY     a. Age 21 - failure of growth plate to close - Pins inserted     Home Medications:  Prior to Admission medications   Medication Sig Start Date End Date Taking? Authorizing Provider  albuterol (VENTOLIN HFA) 108 (90 Base) MCG/ACT inhaler Inhale 2 puffs into the lungs every 6 (six) hours as needed for wheezing or shortness of breath. 01/30/19  Yes Hubbard Hartshorn, FNP  atorvastatin (LIPITOR) 20 MG tablet Take 1 tablet (20 mg total) by mouth daily at 6 PM. 10/13/18  Yes Lada, Satira Anis, MD  cyclobenzaprine (FLEXERIL) 10 MG tablet Take 10 mg by mouth 3 (three) times daily as needed. for muscle spams 08/27/18  Yes [provider]  fluticasone  (FLONASE) 50 MCG/ACT nasal spray Place 2 sprays into both nostrils daily. 01/30/19  Yes Hubbard Hartshorn, FNP  losartan (COZAAR) 50 MG tablet Take 1 tablet by mouth once daily 03/20/19  Yes Poulose, Bethel Born, NP  metoprolol tartrate (LOPRESSOR) 25 MG tablet Take 1 tablet (25 mg total) by mouth 2 (two) times daily. 10/13/18 03/23/19 Yes Lada, Satira Anis, MD  pantoprazole (PROTONIX) 40 MG tablet Take 1 tablet (40 mg total) by mouth daily as needed. Caution:prolonged use may cause health problems; appt needed Patient taking differently: Take 40 mg by mouth daily as needed (GERD symptoms). Caution:prolonged use may cause health problems; appt needed 10/13/18  Yes Lada, Satira Anis, MD    Inpatient Medications: Scheduled Meds:  atorvastatin  20 mg Oral q1800   fluticasone  2 spray Each Nare Daily   metoprolol tartrate  50 mg  Oral BID   Continuous Infusions:  heparin 2,150 Units/hr (03/26/19 0548)   PRN Meds: albuterol, hydrALAZINE, ondansetron **OR** ondansetron (ZOFRAN) IV, oxyCODONE, pantoprazole  Allergies:   No Known Allergies  Social History:   Social History   Socioeconomic History   Marital status: Single    Spouse name: Not on file   Number of children: 2   Years of education: 12   Highest education level: High school graduate  Occupational History   Not on file  Social Needs   Financial resource strain: Not hard at all   Food insecurity    Worry: Never true    Inability: Never true   Transportation needs    Medical: No    Non-medical: Not on file  Tobacco Use   Smoking status: Former Smoker    Packs/day: 1.00    Years: 20.00    Pack years: 20.00    Types: Cigarettes    Quit date: 09/15/2015    Years since quitting: 3.5   Smokeless tobacco: Never Used  Substance and Sexual Activity   Alcohol use: Yes    Alcohol/week: 0.0 standard drinks    Comment: 3-6 cans/beer on weeknights, 6-12 beers on weekends.   Drug use: No   Sexual activity: Yes  Lifestyle    Physical activity    Days per week: 0 days    Minutes per session: 0 min   Stress: Not at all  Relationships   Social connections    Talks on phone: More than three times a week    Gets together: Once a week    Attends religious service: Never    Active member of club or organization: No    Attends meetings of clubs or organizations: Never    Relationship status: Divorced   Intimate partner violence    Fear of current or ex partner: Patient refused    Emotionally abused: Patient refused    Physically abused: Patient refused    Forced sexual activity: Patient refused  Other Topics Concern   Not on file  Social History Narrative   Lives in [place] with his ex-girlfriend's brother.  Works @ [employer] in [employer].  Active @ work w/o limitations but does not routinely exercise.    Family History:    Family History  Problem Relation Age of Onset   Diabetes Mother    Hypertension Father    Heart disease Maternal Grandmother    Heart attack Maternal Grandmother    Diabetes Maternal Grandfather    Diabetes Paternal Grandfather      ROS:  Please see the history of present illness.  Review of Systems  Constitutional: Positive for malaise/fatigue.  HENT:       Multiple recent colds  Respiratory: Positive for cough, sputum production and shortness of breath. Negative for hemoptysis.        Clear sputum with coughing episodes  Cardiovascular: Positive for chest pain, orthopnea and leg swelling. Negative for palpitations.       Chest pressure with lifting objects. Not current. 8/10 at its worst. LEE, improving. Palpitations, improved with BB. Recent orthopnea.  Gastrointestinal: Negative for abdominal pain, blood in stool, constipation, diarrhea, melena, nausea and vomiting.  Genitourinary: Negative for dysuria, frequency, hematuria and urgency.  Neurological: Positive for tingling, weakness and headaches. Negative for loss of consciousness.       Following  lifting objects, felt chest pressure then head pressure / headache. Also felt bilateral UE numbness and tingling  Psychiatric/Behavioral: The patient has insomnia.  Insomnia d/t inability to be comfortable  All other systems reviewed and are negative.   All other ROS reviewed and negative.     Physical Exam/Data:   Vitals:   03/25/19 1121 03/25/19 1553 03/25/19 2115 03/26/19 0456  BP: (!) 116/92 111/88 122/89 (!) 126/92  Pulse: 88 89 94 87  Resp: 18 18  18   Temp: 97.9 F (36.6 C) 97.7 F (36.5 C) 97.9 F (36.6 C) 97.8 F (36.6 C)  TempSrc: Oral Oral Oral Oral  SpO2: 95% 95% 95% 98%  Weight:      Height:        Intake/Output Summary (Last 24 hours) at 03/26/2019 0804 Last data filed at 03/26/2019 0300 Gross per 24 hour  Intake 409.28 ml  Output --  Net 409.28 ml   Last 3 Weights 03/25/2019 03/23/2019 03/23/2019  Weight (lbs) 214 lb 4.6 oz 208 lb 208 lb 1.9 oz  Weight (kg) 97.2 kg 94.348 kg 94.403 kg     Body mass index is 30.75 kg/m.  General:  Well nourished, well developed, in no acute distress. Eating frosted flakes. HEENT: normal Neck: No JVD Vascular: No carotid bruits; radial pulses 1+ bilaterally Cardiac:  normal S1, S2; tachycardic but regular; no murmur  Lungs:  clear to auscultation bilaterally, no wheezing, rhonchi or rales  Abd: soft, nontender, no hepatomegaly  Ext: no lower extremity edema Musculoskeletal:  No deformities Skin: warm and dry  Neuro:  No focal abnormalities noted Psych:  Normal affect   EKG:  The EKG was personally reviewed and demonstrates:  No new tracings since office visit EKG - ST, 108bpm, anterolateral TWI new since 06/2017 Telemetry:  Telemetry was personally reviewed and demonstrates:  SR, PVCs, rate 80-100s bpm, 3PVCs / NSVT at 15:58:47 on 7/12   Relevant CV Studies: 03/25/2019 TTE  1. The left ventricle has severely reduced systolic function, with an ejection fraction of 20-25%. The cavity size was mildly dilated. Left  ventricular diastolic Doppler parameters are consistent with restrictive filling. Left ventrical global  hypokinesis without regional wall motion abnormalities.  2. The right ventricle has mildly reduced systolic function. The cavity was normal. There is no increase in right ventricular wall thickness. Right ventricular systolic pressure is mild to moderately elevated with an estimated pressure of 42.5 mmHg.  3. Mitral valve regurgitation is mild to moderate.  4. Tricuspid valve regurgitation is mild-moderate.  5. Left atrial size was moderately dilated.  6. Right atrial size was mildly dilated.  7. Small pericardial effusion outside the LV free wall.  Laboratory Data:  High Sensitivity Troponin:  No results for input(s): TROPONINIHS in the last 720 hours.   Cardiac EnzymesNo results for input(s): TROPONINI in the last 168 hours. No results for input(s): TROPIPOC in the last 168 hours.  Chemistry Recent Labs  Lab 03/23/19 1231 03/24/19 0530 03/25/19 0502  NA 136 137 137  K 3.5 4.1 4.7  CL 103 107 105  CO2 23 18* 21*  GLUCOSE 93 126* 87  BUN 30* 33* 32*  CREATININE 2.24* 1.80* 1.58*  CALCIUM 8.8* 8.2* 8.4*  GFRNONAA 34* 44* 51*  GFRAA 39* 51* 59*  ANIONGAP 10 12 11     Recent Labs  Lab 03/23/19 1231 03/26/19 0454  PROT 7.1  --   ALBUMIN 3.5  --   AST 87*  --   ALT 180*  --   ALKPHOS 67  --   BILITOT 2.8* 2.2*   Hematology Recent Labs  Lab 03/24/19 0530 03/25/19 0502 03/26/19 3016  WBC 6.3 7.6 6.2  RBC 4.27 4.68 4.26  HGB 12.0* 12.9* 11.5*  HCT 38.4* 43.2 37.4*  MCV 89.9 92.3 87.8  MCH 28.1 27.6 27.0  MCHC 31.3 29.9* 30.7  RDW 16.1* 16.0* 16.0*  PLT 249 291 280   BNPNo results for input(s): BNP, PROBNP in the last 168 hours.  DDimer No results for input(s): DDIMER in the last 168 hours.   Radiology/Studies:  Ct Angio Chest Pe W Or Wo Contrast  Addendum Date: 03/23/2019   ADDENDUM REPORT: 03/23/2019 12:20 ADDENDUM: These results were called by telephone at  the time of interpretation on 03/23/2019 at 12:11 pm to Dr. Murray Hodgkins , who verbally acknowledged these results. Electronically Signed   By: Eddie Candle M.D.   On: 03/23/2019 12:20   Result Date: 03/23/2019 CLINICAL DATA:  Shortness of breath, tachycardia EXAM: CT ANGIOGRAPHY CHEST WITH CONTRAST TECHNIQUE: Multidetector CT imaging of the chest was performed using the standard protocol during bolus administration of intravenous contrast. Multiplanar CT image reconstructions and MIPs were obtained to evaluate the vascular anatomy. CONTRAST:  74mL OMNIPAQUE IOHEXOL 350 MG/ML SOLN COMPARISON:  None. FINDINGS: Cardiovascular: Satisfactory opacification of the pulmonary arteries to the segmental level. Positive examination for pulmonary embolism with multifocal occlusive segmental to subsegmental embolus present bilaterally, most notably in the bilateral lower lobes (series 5, image 145). Cardiomegaly, with enlarged right ventricle although preserved RV LV ratio, less than 0.8. No pericardial effusion. Mediastinum/Nodes: No enlarged mediastinal, hilar, or axillary lymph nodes. Thyroid gland, trachea, and esophagus demonstrate no significant findings. Lungs/Pleura: Small bilateral pleural effusions and mild, diffuse bilateral interlobular septal thickening. There is ground-glass and consolidative opacity of left lung base and to a lesser extent the lateral lateral segment right middle lobe and lateral segment right lower lobe, distal to embolus and a concerning for developing pulmonary infarction (series 6, image 59). Upper Abdomen: No acute abnormality. Musculoskeletal: No chest wall abnormality. No acute or significant osseous findings. Review of the MIP images confirms the above findings. IMPRESSION: 1. Positive examination for pulmonary embolism with multifocal occlusive segmental to subsegmental embolus present bilaterally, most notably in the bilateral lower lobes (series 5, image 145). 2. There is  ground-glass and consolidative opacity of left lung base and to a lesser extent the lateral lateral segment right middle lobe and lateral segment right lower lobe, distal to embolus and a concerning for developing pulmonary infarction (series 6, image 59). 3. Pleural effusions and diffuse bilateral interlobular septal thickening, likely edema. 4. Cardiomegaly, with enlarged right ventricle although preserved RV LV ratio, less than 0.8. Correlate with clinical and echocardiographic evidence of right heart strain. These results were called by telephone at the time of interpretation, final communication will be documented in addendum. Electronically Signed: By: Eddie Candle M.D. On: 03/23/2019 12:06   Vas [place] Lower Extremity Venous (dvt)  Result Date: 03/24/2019  Lower Venous Study Other Indications: Swelling and mild tenderness of the left calf for 6 weeks.                    Dyspnea for 6 months, progressing 6 weeks ago. Risk Factors: None identified. Comparison Study: None Performing Technologist: Pilar Jarvis RDMS, RVT, RDCS  Examination Guidelines: A complete evaluation includes B-mode imaging, spectral Doppler, color Doppler, and power Doppler as needed of all accessible portions of each vessel. Bilateral testing is considered an integral part of a complete examination. Limited examinations for reoccurring indications may be performed as noted.  +---------+---------------+---------+-----------+----------+-------+  RIGHT  Compressibility Phasicity Spontaneity Properties Summary  +---------+---------------+---------+-----------+----------+-------+  CFV       Full            Yes       Yes                             +---------+---------------+---------+-----------+----------+-------+  SFJ       Full            Yes       Yes                             +---------+---------------+---------+-----------+----------+-------+  FV Prox   Full            Yes       Yes                              +---------+---------------+---------+-----------+----------+-------+  FV Mid    Full            Yes       Yes                             +---------+---------------+---------+-----------+----------+-------+  FV Distal Full            Yes       Yes                             +---------+---------------+---------+-----------+----------+-------+  PFV       Full            Yes       Yes                             +---------+---------------+---------+-----------+----------+-------+  POP       Full            Yes       Yes                             +---------+---------------+---------+-----------+----------+-------+  PTV       Full            Yes       Yes                             +---------+---------------+---------+-----------+----------+-------+  PERO      None            No        No          retracted  Chronic  +---------+---------------+---------+-----------+----------+-------+  Soleal    Full            Yes       Yes                             +---------+---------------+---------+-----------+----------+-------+  Gastroc   Full            Yes       Yes                             +---------+---------------+---------+-----------+----------+-------+  GSV       Full            Yes       Yes                             +---------+---------------+---------+-----------+----------+-------+  SSV       Full            Yes       Yes                             +---------+---------------+---------+-----------+----------+-------+ Both peroneal veins appear diminutive and atrophied as opposed to dilated. Both veins contain highly echogenic material.  +---------+---------------+---------+-----------+----------+-------+  LEFT      Compressibility Phasicity Spontaneity Properties Summary  +---------+---------------+---------+-----------+----------+-------+  CFV       Full            Yes       Yes                             +---------+---------------+---------+-----------+----------+-------+  SFJ       Full            Yes        Yes                             +---------+---------------+---------+-----------+----------+-------+  FV Prox   Full            Yes       Yes                             +---------+---------------+---------+-----------+----------+-------+  FV Mid    Full            Yes       Yes                             +---------+---------------+---------+-----------+----------+-------+  FV Distal Full            Yes       Yes                             +---------+---------------+---------+-----------+----------+-------+  PFV       Full            Yes       Yes                             +---------+---------------+---------+-----------+----------+-------+  POP       Full            Yes       Yes                             +---------+---------------+---------+-----------+----------+-------+  PTV       Full            Yes       Yes                             +---------+---------------+---------+-----------+----------+-------+  PERO  Full            Yes       Yes                             +---------+---------------+---------+-----------+----------+-------+  Soleal    Full            Yes       Yes                             +---------+---------------+---------+-----------+----------+-------+  Gastroc   Full            Yes       Yes                             +---------+---------------+---------+-----------+----------+-------+  GSV       Full            Yes       Yes                             +---------+---------------+---------+-----------+----------+-------+  SSV       Full            Yes       Yes                             +---------+---------------+---------+-----------+----------+-------+   Summary: Right: No reflux was noted in the common femoral vein. Findings consistent with chronic deep vein thrombosis involving the right peroneal veins. All other veins visualized appear fully compressible and demonstrate appropriate Doppler characteristics.  Left: No evidence of deep vein thrombosis in the lower  extremity. No indirect evidence of obstruction proximal to the inguinal ligament.  *See table(s) above for measurements and observations. Electronically signed by Ida Rogue MD on 03/24/2019 at 8:58:58 AM.    Final     Assessment and Plan:   Cardiomyopathy, Acute on chronic HFrEF (EF 20-25%) --No SOB/DOE when lying comfortably in bed. Does report 4-6 weeks of intermittent CP, specifically with lifting objects, even light objects.  --Echo as above shows severely reduced EF 23-53%, diastolic doppler shows restrictive filling, LV global hypokinesis without RWMA. RV reduced systolic function. RVSP 42.26mmHg. Mild to moderate MR. Mild to moderate TR. Moderate LAE. Mild RAE. Small pericardial effusion.  --Not volume overloaded on exam.  --Plan for further ischemic evaluation, given chest pressure, severely reduced EF of 20-25% and LV global hypokinesis, and risk factors for CAD including past smoker, HTN, alcohol use, and HLD.  --Pending renal function / updated BMET, will tentatively plan for left heart cardiac catheterization to take place tomorrow 7/14. No plan for right heart catheterization, given bilateral pulmonary embolisms on CTA. Baseline Cr appears ~0.90-0.95 with most recent Cr yesterday 7/12 was 1.58 and pending today's labs.  --Continue heparin. Given plan for cardiac cath, do not start po anticoagulation for bilateral pulmonary embolisms (as below) yet. --NPO after midnight in preparation for possible cardiac catheterization.  --Risks and benefits of cardiac catheterization have been discussed with the patient.  These include bleeding, infection, kidney damage, stroke, heart attack, death.  The patient understands these risks and is willing to proceed. --Following catheterization, patient will likely need guideline directed therapy to include ASA, BB, statin, Entresto, and spironolactone with further recommendations following  cath results. Of note, PTA medications included Lipitor 20mg   daily, lopressor 25mg  BID, and losartan 50mg  daily. Will order baseline lipid panel and recommend high intensity statin at Lipitor 80mg  daily. Given low EF, recommend transition losartan to Entresto as tolerated per guidelines. He will also need to follow-up in the office within two weeks of discharge, as well as close monitoring of renal function. He will also need follow-up echo to monitor EF.   Bilateral Pulmonary Embolism  --Dyspnea on exertion x6 weeks with associated chest pressure and LLE edema x4-6 weeks without tenderness and no palpable cord on exam. --Bilateral PE as above in CTA. Will need discharged on po anticoagulation and after cardiac cath with either Eliquis or Xarelto. Continue heparin for now in preparation for cath. Remainder as above.   Chest pressure --No current chest pressure. As above, chest pressure with lifting even light objects and severe DOE in setting of bilateral PE. --Several risk factors for cardiac etiology, including past history of smoking. Echo concerning with EF 20-25%, LV global hypokinesis. EKG with new anterolateral TWI. --Hgb A1C 5.5. Pending updated LDL with previous LDL in 2017 113. --Plan for further ischemic workup with LHC, tentatively scheduled for 7/14.  --As above, will likely need guideline directed therapy to include ASA, BB, high intensity statin, Entresto (transition from ARB to [place]), and spironolactone with further recommendations pending LHC. Continue heparin. NPO after midnight.  Remainder as above.  HTN  - Continue medical management. Titrate BB as HR allows for BP control. Holding ARB in preparation for cath. Recommend transition from ARB to [facility] as tolerated d/t low EF.  PVCs/ Palpitations --Seen on telemetry. Sx controlled with BB. Continue.   HLD  --Continue statin. Recommend increased dose for high intensity statin. Follow-up labs as above.  AOCKD --Daily BMET  H/o tobacco abuse --Continued cessation  advised.    For questions or updates, please contact [place] Please consult [email or web address] for contact info under     Signed, Arvil Chaco, PA-C  03/26/2019 8:04 AM

## 2019-03-26 NOTE — Progress Notes (Signed)
[place] [facility] [place] NAME: David Carlson    MR#:  [id number]  DATE OF BIRTH:  1971/02/04  SUBJECTIVE:  CHIEF COMPLAINT:   Chief Complaint  Patient presents with  . Pulmonary Embolisum   Patient has better chest pain and shortness of breath. REVIEW OF SYSTEMS:  Review of Systems  Constitutional: Positive for malaise/fatigue. Negative for chills and fever.  HENT: Negative for sore throat.   Eyes: Negative for blurred vision and double vision.  Respiratory: Positive for cough. Negative for hemoptysis, sputum production, shortness of breath, wheezing and stridor.   Cardiovascular: Negative for chest pain, palpitations, orthopnea and leg swelling.  Gastrointestinal: Negative for abdominal pain, blood in stool, diarrhea, melena, nausea and vomiting.  Genitourinary: Negative for dysuria, flank pain and hematuria.  Musculoskeletal: Negative for back pain and joint pain.  Skin: Negative for rash.  Neurological: Negative for dizziness, sensory change, focal weakness, seizures, loss of consciousness, weakness and headaches.  Endo/Heme/Allergies: Negative for polydipsia.  Psychiatric/Behavioral: Negative for depression. The patient is not nervous/anxious.     DRUG ALLERGIES:  No Known Allergies VITALS:  Blood pressure (!) 132/103, pulse 89, temperature (!) 97.4 F (36.3 C), temperature source Oral, resp. rate 20, height 5\' 10"  (1.778 m), weight 97.2 kg, SpO2 98 %. PHYSICAL EXAMINATION:  Physical Exam Constitutional:      General: He is not in acute distress. HENT:     Head: Normocephalic.     Mouth/Throat:     Mouth: Mucous membranes are moist.  Eyes:     General: No scleral icterus.    Conjunctiva/sclera: Conjunctivae normal.     Pupils: Pupils are equal, round, and reactive to light.  Neck:     Musculoskeletal: Normal range of motion and neck supple.     Vascular: No JVD.     Trachea: No tracheal deviation.  Cardiovascular:    Rate and Rhythm: Normal rate and regular rhythm.     Heart sounds: Normal heart sounds. No murmur. No gallop.   Pulmonary:     Effort: Pulmonary effort is normal. No respiratory distress.     Breath sounds: Normal breath sounds. No wheezing or rales.  Abdominal:     General: Bowel sounds are normal. There is no distension.     Palpations: Abdomen is soft.     Tenderness: There is no abdominal tenderness. There is no rebound.  Musculoskeletal: Normal range of motion.        General: No tenderness.     Right lower leg: No edema.     Left lower leg: No edema.  Skin:    Findings: No erythema or rash.  Neurological:     General: No focal deficit present.     Mental Status: He is alert and oriented to person, place, and time.     Cranial Nerves: No cranial nerve deficit.  Psychiatric:        Mood and Affect: Mood normal.    LABORATORY PANEL:  Male CBC Recent Labs  Lab 03/26/19 0454  WBC 6.2  HGB 11.5*  HCT 37.4*  PLT 280   ------------------------------------------------------------------------------------------------------------------ Chemistries  Recent Labs  Lab 03/23/19 1231  03/25/19 0502 03/26/19 0454  NA 136   < > 137  --   K 3.5   < > 4.7  --   CL 103   < > 105  --   CO2 23   < > 21*  --   GLUCOSE 93   < >  87  --   BUN 30*   < > 32*  --   CREATININE 2.24*   < > 1.58*  --   CALCIUM 8.8*   < > 8.4*  --   MG  --   --  1.7  --   AST 87*  --   --   --   ALT 180*  --   --   --   ALKPHOS 67  --   --   --   BILITOT 2.8*  --   --  2.2*   < > = values in this interval not displayed.   RADIOLOGY:  No results found. ASSESSMENT AND PLAN:   1.  Pulmonary embolism multifocal with possibility of pulmonary infarct.  Hypercoagulable work-up sent off.  Continue Heparin drip.  Patient has a chronic DVT right lower extremity per venous duplex.  Follow-up echocardiograph.  I discussed with patient about oral anticoagulation options and side effects.  He prefers to take  Eliquis or Xarelto. 2.  Acute kidney injury likely on chronic kidney disease stage III.  Hold losartan. Improving with IV fluid hydration since the patient received IV contrast for CT scan of the chest 3.  Hypertension.  Hold losartan and continue metoprolol. 4.  GERD on Protonix 5.  Knee pain has gone away hold off any further anti-inflammatories  Cardiomyopathy with ejection fraction 20 to 25%. Cardiac cath tomorrow.  Continue heparin drip, the patient will need Entresto and Lopressor per cardiology consult.  Elevated bilirubin.  RUQ ultrasound.  All the records are reviewed and case discussed with Care Management/Social Worker. Management plans discussed with the patient, family and they are in agreement.  CODE STATUS: Full Code  TOTAL TIME TAKING CARE OF THIS PATIENT: 28 minutes.   More than 50% of the time was spent in counseling/coordination of care: YES  POSSIBLE D/C IN 2 DAYS, DEPENDING ON CLINICAL CONDITION.   Demetrios Loll M.D on 03/26/2019 at 11:17 AM  Between 7am to 6pm - Pager - [phone]  After 6pm go to [email or web address] - [facility]

## 2019-03-26 NOTE — Progress Notes (Signed)
ANTICOAGULATION CONSULT NOTE - Initial Consult  Pharmacy Consult for Heparin Indication: pulmonary embolus  No Known Allergies  Patient Measurements: Height: 5\' 10"  (177.8 cm) Weight: 214 lb 4.6 oz (97.2 kg) IBW/kg (Calculated) : 73 Heparin Dosing Weight: 92.2 kg  Vital Signs: Temp: 97.8 F (36.6 C) (07/13 0456) Temp Source: Oral (07/13 0456) BP: 126/92 (07/13 0456) Pulse Rate: 87 (07/13 0456)  Labs: Recent Labs    03/23/19 1231  03/24/19 0530  03/24/19 1953 03/25/19 0502 03/26/19 0454  HGB 12.8*  --  12.0*  --   --  12.9* 11.5*  HCT 41.1  --  38.4*  --   --  43.2 37.4*  PLT 235  --  249  --   --  291 280  APTT 35  --   --   --   --   --   --   LABPROT 17.0*  --   --   --   --   --   --   INR 1.4*  --   --   --   --   --   --   HEPARINUNFRC  --    < > 0.24*   < > 0.40 0.37 0.23*  CREATININE 2.24*  --  1.80*  --   --  1.58*  --    < > = values in this interval not displayed.    Estimated Creatinine Clearance: 67.6 mL/min (A) (by C-G formula based on SCr of 1.58 mg/dL (H)).   Assessment: 48 yo male here with PE. No oral anticoagulants noted on PTA med list.    7/10 Heparin infusion started @ 1550 units/hr.  7/10 @ 2235 HL: 0.20. Level is subtherapeutic.infusion increased to 1750 units/hr 7/11 @0530  HL 0.24 1400 unit IV x 1 and infusion increased to 1950 units/hr.  7/11 @1353  HL 0.39.  7/11 @1953  HL 0.40 7/12 @ 0502 HL 0.37  Goal of Therapy:  Heparin level 0.3-0.7 units/ml Monitor platelets by anticoagulation protocol: Yes   Plan:  7/13 @ 0454 Heparin level 0.23. Level is subtherapeutic.  Will order 1400 unit bolus and increase infusion rate to 2150units/hr. Will recheck HL in 6 hours  Continue to check HL and CBC daily while on heparin per protocol.   Pernell Dupre, PharmD, BCPS Clinical Pharmacist 03/26/2019 5:32 AM

## 2019-03-26 NOTE — Progress Notes (Signed)
ANTICOAGULATION CONSULT NOTE   Pharmacy Consult for Heparin Indication: pulmonary embolus  No Known Allergies  Patient Measurements: Height: 5\' 10"  (177.8 cm) Weight: 214 lb 4.6 oz (97.2 kg) IBW/kg (Calculated) : 73 Heparin Dosing Weight: 92.2 kg  Vital Signs: Temp: 97.7 F (36.5 C) (07/13 1217) Temp Source: Oral (07/13 1217) BP: 119/79 (07/13 1217) Pulse Rate: 91 (07/13 1217)  Labs: Recent Labs    03/24/19 0530  03/25/19 0502 03/26/19 0454 03/26/19 1222  HGB 12.0*  --  12.9* 11.5*  --   HCT 38.4*  --  43.2 37.4*  --   PLT 249  --  291 280  --   HEPARINUNFRC 0.24*   < > 0.37 0.23* 0.34  CREATININE 1.80*  --  1.58*  --  1.09   < > = values in this interval not displayed.    Estimated Creatinine Clearance: 98 mL/min (by C-G formula based on SCr of 1.09 mg/dL).   Assessment: 48 yo male here with PE. No oral anticoagulants noted on PTA med list.    7/10 Heparin infusion started @ 1550 units/hr.  7/10 @ 2235 HL: 0.20. Level is subtherapeutic.infusion increased to 1750 units/hr 7/11 @0530  HL 0.24 1400 unit IV x 1 and infusion increased to 1950 units/hr.  7/11 @1353  HL 0.39.  7/11 @1953  HL 0.40 7/12 @ 0502 HL 0.37 7/13 @ 0454 Heparin level 0.23. increase infusion rate to 2150units/hr.   Goal of Therapy:  Heparin level 0.3-0.7 units/ml Monitor platelets by anticoagulation protocol: Yes   Plan:  7/13 1222 HL 0.34. Will continue current drip rate of 2150 units/hr. Will check confirmatory level in 6 hrs. Continue to check HL and CBC daily while on heparin per protocol.   Noralee Space, PharmD, BCPS Clinical Pharmacist 03/26/2019 1:15 PM

## 2019-03-27 ENCOUNTER — Inpatient Hospital Stay: Payer: Managed Care, Other (non HMO)

## 2019-03-27 ENCOUNTER — Encounter
Admission: EM | Disposition: A | Discharge status: Home / Self Care | Payer: Self-pay | Source: Home / Self Care | Attending: Internal Medicine

## 2019-03-27 DIAGNOSIS — I429 Cardiomyopathy, unspecified: Secondary | ICD-10-CM

## 2019-03-27 HISTORY — PX: LEFT HEART CATH AND CORONARY ANGIOGRAPHY: CATH118249

## 2019-03-27 LAB — CBC
HCT: 38.5 % — ABNORMAL LOW (ref 39.0–52.0)
Hemoglobin: 12 g/dL — ABNORMAL LOW (ref 13.0–17.0)
MCH: 27.8 pg (ref 26.0–34.0)
MCHC: 31.2 g/dL (ref 30.0–36.0)
MCV: 89.1 fL (ref 80.0–100.0)
Platelets: 280 10*3/uL (ref 150–400)
RBC: 4.32 MIL/uL (ref 4.22–5.81)
RDW: 16.1 % — ABNORMAL HIGH (ref 11.5–15.5)
WBC: 6 10*3/uL (ref 4.0–10.5)
nRBC: 0 % (ref 0.0–0.2)

## 2019-03-27 LAB — BETA-2-GLYCOPROTEIN I ABS, IGG/M/A
Beta-2 Glyco I IgG: <9 GPI IgG units (ref 0–20)
Beta-2-Glycoprotein I IgA: <9 GPI IgA units (ref 0–25)
Beta-2-Glycoprotein I IgM: <9 GPI IgM units (ref 0–32)

## 2019-03-27 LAB — HEPATIC FUNCTION PANEL
ALT: 163 U/L — ABNORMAL HIGH (ref 0–44)
AST: 135 U/L — ABNORMAL HIGH (ref 15–41)
Albumin: 2.9 g/dL — ABNORMAL LOW (ref 3.5–5.0)
Alkaline Phosphatase: 75 U/L (ref 38–126)
Bilirubin, Direct: 1 mg/dL — ABNORMAL HIGH (ref 0.0–0.2)
Indirect Bilirubin: 1.1 mg/dL — ABNORMAL HIGH (ref 0.3–0.9)
Total Bilirubin: 2.1 mg/dL — ABNORMAL HIGH (ref 0.3–1.2)
Total Protein: 6 g/dL — ABNORMAL LOW (ref 6.5–8.1)

## 2019-03-27 LAB — HEPARIN LEVEL (UNFRACTIONATED): Heparin Unfractionated: 0.34 IU/mL (ref 0.30–0.70)

## 2019-03-27 SURGERY — LEFT HEART CATH AND CORONARY ANGIOGRAPHY
Anesthesia: Moderate Sedation

## 2019-03-27 MED ORDER — MIDAZOLAM HCL 2 MG/2ML IJ SOLN
INTRAMUSCULAR | Status: DC | PRN
  Administered 2019-03-27: 1 mg via INTRAVENOUS

## 2019-03-27 MED ORDER — VERAPAMIL HCL 2.5 MG/ML IV SOLN
INTRAVENOUS | Status: DC | PRN
  Administered 2019-03-27: 2.5 mg via INTRA_ARTERIAL

## 2019-03-27 MED ORDER — SODIUM CHLORIDE 0.9% FLUSH
3.0000 mL | Freq: Two times a day (BID) | INTRAVENOUS | Status: DC
  Administered 2019-03-27 – 2019-03-28 (×2): 3 mL via INTRAVENOUS

## 2019-03-27 MED ORDER — SODIUM CHLORIDE 0.9% FLUSH: 3.0000 mL | INTRAVENOUS | Status: DC | PRN

## 2019-03-27 MED ORDER — MIDAZOLAM HCL 2 MG/2ML IJ SOLN
INTRAMUSCULAR | Status: AC
  Filled 2019-03-27: qty 2

## 2019-03-27 MED ORDER — FUROSEMIDE 10 MG/ML IJ SOLN
40.0000 mg | Freq: Two times a day (BID) | INTRAMUSCULAR | Status: DC
  Administered 2019-03-27 – 2019-03-28 (×2): 40 mg via INTRAVENOUS
  Filled 2019-03-27 (×2): qty 4

## 2019-03-27 MED ORDER — FENTANYL CITRATE (PF) 100 MCG/2ML IJ SOLN
INTRAMUSCULAR | Status: DC | PRN
  Administered 2019-03-27: 50 ug via INTRAVENOUS

## 2019-03-27 MED ORDER — IOHEXOL 300 MG/ML  SOLN
INTRAMUSCULAR | Status: DC | PRN
  Administered 2019-03-27: 30 mL via INTRAVENOUS

## 2019-03-27 MED ORDER — HEPARIN (PORCINE) IN NACL 1000-0.9 UT/500ML-% IV SOLN
INTRAVENOUS | Status: DC | PRN
  Administered 2019-03-27: 500 mL

## 2019-03-27 MED ORDER — SODIUM CHLORIDE 0.9 % IV SOLN: 250.0000 mL | INTRAVENOUS | Status: DC | PRN

## 2019-03-27 MED ORDER — VERAPAMIL HCL 2.5 MG/ML IV SOLN
INTRAVENOUS | Status: AC
  Filled 2019-03-27: qty 2

## 2019-03-27 MED ORDER — HEPARIN (PORCINE) IN NACL 1000-0.9 UT/500ML-% IV SOLN
INTRAVENOUS | Status: AC
  Filled 2019-03-27: qty 1000

## 2019-03-27 MED ORDER — HEPARIN (PORCINE) 25000 UT/250ML-% IV SOLN
2300.0000 [IU]/h | INTRAVENOUS | Status: DC
  Administered 2019-03-27: 18:00:00 2150 [IU]/h via INTRAVENOUS
  Administered 2019-03-28: 03:00:00 2300 [IU]/h via INTRAVENOUS
  Filled 2019-03-27 (×2): qty 250

## 2019-03-27 MED ORDER — FENTANYL CITRATE (PF) 100 MCG/2ML IJ SOLN
INTRAMUSCULAR | Status: AC
  Filled 2019-03-27: qty 2

## 2019-03-27 MED ORDER — CARVEDILOL 6.25 MG PO TABS
6.2500 mg | ORAL_TABLET | Freq: Two times a day (BID) | ORAL | Status: DC
  Administered 2019-03-27 – 2019-03-28 (×2): 6.25 mg via ORAL
  Filled 2019-03-27: qty 2
  Filled 2019-03-27 (×3): qty 1
  Filled 2019-03-27: qty 2

## 2019-03-27 MED ORDER — HEPARIN SODIUM (PORCINE) 1000 UNIT/ML IJ SOLN
INTRAMUSCULAR | Status: AC
  Filled 2019-03-27: qty 1

## 2019-03-27 MED ORDER — HEPARIN SODIUM (PORCINE) 1000 UNIT/ML IJ SOLN
INTRAMUSCULAR | Status: DC | PRN
  Administered 2019-03-27: 5000 [IU] via INTRAVENOUS

## 2019-03-27 MED ORDER — SODIUM CHLORIDE 0.9% FLUSH
3.0000 mL | Freq: Two times a day (BID) | INTRAVENOUS | Status: DC
  Administered 2019-03-27 – 2019-03-28 (×3): 3 mL via INTRAVENOUS

## 2019-03-27 SURGICAL SUPPLY — 7 items
CATH 5F 110X4 TIG (CATHETERS) ×3 IMPLANT
DEVICE RAD TR BAND REGULAR (VASCULAR PRODUCTS) ×2 IMPLANT
GLIDESHEATH SLEND SS 6F .021 (SHEATH) ×3 IMPLANT
KIT MANI 3VAL PERCEP (MISCELLANEOUS) ×3 IMPLANT
PACK CARDIAC CATH (CUSTOM PROCEDURE TRAY) ×3 IMPLANT
WIRE HITORQ VERSACORE ST 145CM (WIRE) ×2 IMPLANT
WIRE ROSEN-J .035X260CM (WIRE) ×3 IMPLANT

## 2019-03-27 NOTE — Progress Notes (Signed)
ANTICOAGULATION CONSULT NOTE   Pharmacy Consult for Heparin Indication: pulmonary embolus  No Known Allergies  Patient Measurements: Height: 5\' 10"  (177.8 cm) Weight: 218 lb 14.7 oz (99.3 kg) IBW/kg (Calculated) : 73 Heparin Dosing Weight: 92.2 kg  Vital Signs: Temp: 97.9 F (36.6 C) (07/14 0538) Temp Source: Oral (07/14 0538) BP: 135/101 (07/14 0538) Pulse Rate: 97 (07/14 0538)  Labs: Recent Labs    03/25/19 0502 03/26/19 0454 03/26/19 1222 03/26/19 1809 03/27/19 0459  HGB 12.9* 11.5*  --   --  12.0*  HCT 43.2 37.4*  --   --  38.5*  PLT 291 280  --   --  280  HEPARINUNFRC 0.37 0.23* 0.34 0.38 0.34  CREATININE 1.58*  --  1.09  --   --     Estimated Creatinine Clearance: 98.9 mL/min (by C-G formula based on SCr of 1.09 mg/dL).   Assessment: 48 yo male here with PE. No oral anticoagulants noted on PTA med list.    7/10 Heparin infusion started @ 1550 units/hr.  7/10 @ 2235 HL: 0.20. Level is subtherapeutic.infusion increased to 1750 units/hr 7/11 @0530  HL 0.24 1400 unit IV x 1 and infusion increased to 1950 units/hr.  7/11 @1353  HL 0.39.  7/11 @1953  HL 0.40 7/12 @ 0502 HL 0.37 7/13 @ 0454 Heparin level 0.23. increase infusion rate to 2150units/hr. 7/13 @ 1222 HL 0.34   Goal of Therapy:  Heparin level 0.3-0.7 units/ml Monitor platelets by anticoagulation protocol: Yes   Plan:  07/14 @ 0500 HL 0.34 therapeutic. Will continue current rate at 2150 units/hr and will recheck HL w/ am labs. CBC stable will continue to monitor.  Tobie Lords, PharmD Clinical Pharmacist 03/27/2019 6:01 AM

## 2019-03-27 NOTE — Progress Notes (Addendum)
[place] at [place] NAME: David Carlson    MR#:  [id number]  DATE OF BIRTH:  Aug 12, 1971  SUBJECTIVE:  CHIEF COMPLAINT:   Chief Complaint  Patient presents with  . Pulmonary Embolisum   Patient has no chest pain or shortness of breath. REVIEW OF SYSTEMS:  Review of Systems  Constitutional: Positive for malaise/fatigue. Negative for chills and fever.  HENT: Negative for sore throat.   Eyes: Negative for blurred vision and double vision.  Respiratory: Positive for cough. Negative for hemoptysis, sputum production, shortness of breath, wheezing and stridor.   Cardiovascular: Negative for chest pain, palpitations, orthopnea and leg swelling.  Gastrointestinal: Negative for abdominal pain, blood in stool, diarrhea, melena, nausea and vomiting.  Genitourinary: Negative for dysuria, flank pain and hematuria.  Musculoskeletal: Negative for back pain and joint pain.  Skin: Negative for rash.  Neurological: Negative for dizziness, sensory change, focal weakness, seizures, loss of consciousness, weakness and headaches.  Endo/Heme/Allergies: Negative for polydipsia.  Psychiatric/Behavioral: Negative for depression. The patient is not nervous/anxious.     DRUG ALLERGIES:  No Known Allergies VITALS:  Blood pressure (!) 146/108, pulse 99, temperature 97.9 F (36.6 C), temperature source Oral, resp. rate 14, height 5\' 10"  (1.778 m), weight 99.3 kg, SpO2 99 %. PHYSICAL EXAMINATION:  Physical Exam Constitutional:      General: He is not in acute distress. HENT:     Head: Normocephalic.     Mouth/Throat:     Mouth: Mucous membranes are moist.  Eyes:     General: No scleral icterus.    Conjunctiva/sclera: Conjunctivae normal.     Pupils: Pupils are equal, round, and reactive to light.  Neck:     Musculoskeletal: Normal range of motion and neck supple.     Vascular: No JVD.     Trachea: No tracheal deviation.  Cardiovascular:     Rate  and Rhythm: Normal rate and regular rhythm.     Heart sounds: Normal heart sounds. No murmur. No gallop.   Pulmonary:     Effort: Pulmonary effort is normal. No respiratory distress.     Breath sounds: Normal breath sounds. No wheezing or rales.  Abdominal:     General: Bowel sounds are normal. There is no distension.     Palpations: Abdomen is soft.     Tenderness: There is no abdominal tenderness. There is no rebound.  Musculoskeletal: Normal range of motion.        General: No tenderness.     Right lower leg: No edema.     Left lower leg: No edema.  Skin:    Findings: No erythema or rash.  Neurological:     General: No focal deficit present.     Mental Status: He is alert and oriented to person, place, and time.     Cranial Nerves: No cranial nerve deficit.  Psychiatric:        Mood and Affect: Mood normal.    LABORATORY PANEL:  Male CBC Recent Labs  Lab 03/27/19 0459  WBC 6.0  HGB 12.0*  HCT 38.5*  PLT 280   ------------------------------------------------------------------------------------------------------------------ Chemistries  Recent Labs  Lab 03/23/19 1231  03/25/19 0502 03/26/19 0454 03/26/19 1222  NA 136   < > 137  --  133*  K 3.5   < > 4.7  --  4.1  CL 103   < > 105  --  105  CO2 23   < > 21*  --  19*  GLUCOSE 93   < > 87  --  93  BUN 30*   < > 32*  --  25*  CREATININE 2.24*   < > 1.58*  --  1.09  CALCIUM 8.8*   < > 8.4*  --  7.9*  MG  --   --  1.7  --   --   AST 87*  --   --   --   --   ALT 180*  --   --   --   --   ALKPHOS 67  --   --   --   --   BILITOT 2.8*  --   --  2.2*  --    < > = values in this interval not displayed.   RADIOLOGY:  US Abdomen Limited Ruq  Result Date: 03/27/2019 CLINICAL DATA:  Elevated LFTs. EXAM: ULTRASOUND ABDOMEN LIMITED RIGHT UPPER QUADRANT COMPARISON:  None. FINDINGS: Gallbladder: Several small stones are seen in the gallbladder in addition to wall thickening measuring up to 5.6 mm. No pericholecystic fluid,  sludge, or Murphy's sign. Common bile duct: Diameter: 1.5 mm Liver: No focal mass. Portal vein is patent on color Doppler imaging with normal direction of blood flow towards the liver. IMPRESSION: 1. There are several small stones in the gallbladder with gallbladder wall thickening measuring 5.6 mm. No sludge, pericholecystic fluid, or Murphy's sign. If there is continued concern for acute cholecystitis, recommend a HIDA scan. Electronically Signed   By: Dorise Bullion III M.D   On: 03/27/2019 08:18   ASSESSMENT AND PLAN:   1.  Pulmonary embolism multifocal with possibility of pulmonary infarct.  Hypercoagulable work-up sent off.  Continue Heparin drip.  Patient has a chronic DVT right lower extremity per venous duplex.  Follow-up echocardiograph.  I discussed with patient about oral anticoagulation options and side effects.  Start Eliquis tomorrow. 2.  Acute kidney injury likely on chronic kidney disease stage III.  Hold losartan. Improved with IV fluid hydration since the patient received IV contrast for CT scan of the chest 3.  Hypertension.  Hold losartan and continue metoprolol. 4.  GERD on Protonix 5.  Knee pain has gone away hold off any further anti-inflammatories  Cardiomyopathy with ejection fraction 20 to 25%. Cardiac cath today.  Continue heparin drip, the patient will need Entresto and Torprol per cardiology consult.  Cholelithiasis and elevated bilirubin.  RUQ ultrasound: There are several small stones in the gallbladder with gallbladder wall thickening measuring 5.6 mm.  Surgical consult. MRCP of abdomen per Dr. Zachery Dauer. All the records are reviewed and case discussed with Care Management/Social Worker. Management plans discussed with the patient, family and they are in agreement.  CODE STATUS: Full Code  TOTAL TIME TAKING CARE OF THIS PATIENT: 30 minutes.   More than 50% of the time was spent in counseling/coordination of care: YES  POSSIBLE D/C IN 2 DAYS, DEPENDING ON  CLINICAL CONDITION.   Demetrios Loll M.D on 03/27/2019 at 2:03 PM  Between 7am to 6pm - Pager - [phone]  After 6pm go to [email or web address] - [facility]

## 2019-03-27 NOTE — Brief Op Note (Signed)
BRIEF CARDIAC CATHETERIZATION NOTE  03/27/2019  2:28 PM  PATIENT:  Glean Salvo II  48 y.o. male  PRE-OPERATIVE DIAGNOSIS:  Cardiomyopathy with severely reduced ejection fraction  POST-OPERATIVE DIAGNOSIS:    PROCEDURE:  Procedure(s): LEFT HEART CATH AND CORONARY ANGIOGRAPHY (N/A)  SURGEON:  Surgeon(s) and Role:    * Lonzie Simmer, MD - Primary  FINDINGS: 1. Mild, nonobstructive coronary artery disease.  Findings are consistent with nonischemic cardiomyopathy. 2. Severely elevated left ventricular filling pressure.  RECOMMENDATIONS: 1. IV diuresis and optimization of evidence-based heart failure therapy. 2. Start heparin infusion 2 hours after TR band removal; anticipate transitioning to oral anticoagulation for treatment of bilateral pulmonary emboli tomorrow.  Nelva Bush, MD [facility] Pager: [phone]

## 2019-03-27 NOTE — Progress Notes (Signed)
Pt BP elevated at 138/102 checked manually in left arm.  Spoke with Dr Marcille Blanco and will not give prn apresoline as pt is scheduled to go to cardiac cath this morning. Dorna Bloom RN

## 2019-03-27 NOTE — Interval H&P Note (Signed)
History and Physical Interval Note:  03/27/2019 1:08 PM  David Carlson  has presented today for cardiac catheterization, with the diagnosis of cardiomyopathy with severely reduced ejection fraction.  The various methods of treatment have been discussed with the patient and family. After consideration of risks, benefits and other options for treatment, the patient has consented to  Procedure(s): LEFT HEART CATH AND CORONARY ANGIOGRAPHY (N/A) as a surgical intervention.  The patient's history has been reviewed, patient examined, no change in status, stable for surgery.  I have reviewed the patient's chart and labs.  Questions were answered to the patient's satisfaction.    Cath Lab Visit (complete for each Cath Lab visit)  Clinical Evaluation Leading to the Procedure:   ACS: No.  Non-ACS:    Anginal/HF Classification: NYHA IV  Anti-ischemic medical therapy: Minimal Therapy (1 class of medications)  Non-Invasive Test Results: No non-invasive testing performed (LVEF < 35% -> high risk)  Prior CABG: No previous CABG  David Carlson

## 2019-03-27 NOTE — Progress Notes (Signed)
ANTICOAGULATION CONSULT NOTE   Pharmacy Consult for Heparin Indication: pulmonary embolus  No Known Allergies  Patient Measurements: Height: 5\' 10"  (177.8 cm) Weight: 218 lb 14.7 oz (99.3 kg) IBW/kg (Calculated) : 73 Heparin Dosing Weight: 92.2 kg  Vital Signs: Temp: 97.9 F (36.6 C) (07/14 1215) Temp Source: Oral (07/14 1215) BP: 132/106 (07/14 1530) Pulse Rate: 85 (07/14 1530)  Labs: Recent Labs    03/25/19 0502 03/26/19 0454 03/26/19 1222 03/26/19 1809 03/27/19 0459  HGB 12.9* 11.5*  --   --  12.0*  HCT 43.2 37.4*  --   --  38.5*  PLT 291 280  --   --  280  HEPARINUNFRC 0.37 0.23* 0.34 0.38 0.34  CREATININE 1.58*  --  1.09  --   --     Estimated Creatinine Clearance: 98.9 mL/min (by C-G formula based on SCr of 1.09 mg/dL).   Assessment: 48 yo male here with PE. No oral anticoagulants noted on PTA med list.    7/10 Heparin infusion started @ 1550 units/hr.  7/10 @ 2235 HL: 0.20. Level is subtherapeutic.infusion increased to 1750 units/hr 7/11 @0530  HL 0.24 1400 unit IV x 1 and infusion increased to 1950 units/hr.  7/11 @1353  HL 0.39.  7/11 @1953  HL 0.40 7/12 @ 0502 HL 0.37 7/13 @ 0454 Heparin level 0.23. increase infusion rate to 2150units/hr. 7/13 @ 1222 HL 0.34   Goal of Therapy:  Heparin level 0.3-0.7 units/ml Monitor platelets by anticoagulation protocol: Yes   Plan:  07/14 Heparin drip held for cath. Pt now post-procedure. Per consult re-start heparin drip 2 hrs after TR band removal. Spoke with RN in specials w/ planned TR band removal at ~1600. Pt received heparin with procedure. Per Dr. Saunders Revel no need to re-bolus. Will order heparin drip to resume at 1800 at previous rate of 2150 units/hr. Will order heparin level at 7/15 0000. CBC with morning labs.   Tawnya Crook, PharmD Clinical Pharmacist 03/27/2019 3:50 PM

## 2019-03-27 NOTE — Consult Note (Signed)
SURGICAL CONSULTATION NOTE   HISTORY OF PRESENT ILLNESS (HPI):  48 y.o. male admitted due to bilateral PE seen on CT scan.  She was being evaluated by his cardiologist and they did ultrasound of his lower extremities and shows chronic blood clots.  CT scan of the chest was done showing the bilateral PE with suspected lung ischemia.  He was started on anticoagulation therapy.  Upon evaluation by cardiologist patient had cardiac at that the rhythm which shows an acute heart failure of 20 to 25% ejection fraction. Patient denies any history of abdominal pain.  There is no right upper quadrant pain.  Patient reports feeling pressure on the chest but this was explained by the bilateral PE.  Upon work-up he had elevated liver function test and ultrasound of the abdomen was done.  The ultrasound shows stones in the gallbladder.  Gallbladder with again is reported up to 5 mm.  Common bile duct reported as 1.5 mm.  I personally measured the common bile duct and my opinion is 4 mm.  I personally evaluated the images. During my evaluation right now patient was eating chicken pizza without aggravation of abdominal pain.  There is no pain radiation.  There is no alleviating or aggravating factor.  Surgery is consulted by Dr. Bridgett Larsson in this context for evaluation and management of "cholelithiasis with elevated bilirubin".  PAST MEDICAL HISTORY (PMH):  Past Medical History:  Diagnosis Date  . Anxiety   . Broken shoulder   . Collar bone fracture    x 2  . Erectile dysfunction   . GERD (gastroesophageal reflux disease)   . H/O Bell's palsy   . Hyperlipidemia   . Hypertension   . Obesity   . Palpitations    a. Previously evaluted when he was incarcerated in [place] - reportedly nl stress test.  . PVC's (premature ventricular contractions)    a. 04/2013 Echo: EF 50-55%, Gr1 DD, mildly dil LA;  b. 04/2017 48hr holter: freq PVCs (5.4%), rare PACs. Single 13 beat atrial run - 143 bpm.     PAST SURGICAL HISTORY ([place]):   Past Surgical History:  Procedure Laterality Date  . CARPAL TUNNEL RELEASE Bilateral   . HERNIA REPAIR  2015  . HIP SURGERY     a. Age 36 - failure of growth plate to close - Pins inserted     MEDICATIONS:  Prior to Admission medications   Medication Sig Start Date End Date Taking? Authorizing Provider  albuterol (VENTOLIN HFA) 108 (90 Base) MCG/ACT inhaler Inhale 2 puffs into the lungs every 6 (six) hours as needed for wheezing or shortness of breath. 01/30/19  Yes Hubbard Hartshorn, FNP  atorvastatin (LIPITOR) 20 MG tablet Take 1 tablet (20 mg total) by mouth daily at 6 PM. 10/13/18  Yes Lada, Satira Anis, MD  cyclobenzaprine (FLEXERIL) 10 MG tablet Take 10 mg by mouth 3 (three) times daily as needed. for muscle spams 08/27/18  Yes [provider]  fluticasone (FLONASE) 50 MCG/ACT nasal spray Place 2 sprays into both nostrils daily. 01/30/19  Yes Hubbard Hartshorn, FNP  losartan (COZAAR) 50 MG tablet Take 1 tablet by mouth once daily 03/20/19  Yes Poulose, Bethel Born, NP  metoprolol tartrate (LOPRESSOR) 25 MG tablet Take 1 tablet (25 mg total) by mouth 2 (two) times daily. 10/13/18 03/23/19 Yes Lada, Satira Anis, MD  pantoprazole (PROTONIX) 40 MG tablet Take 1 tablet (40 mg total) by mouth daily as needed. Caution:prolonged use may cause health problems; appt needed Patient  taking differently: Take 40 mg by mouth daily as needed (GERD symptoms). Caution:prolonged use may cause health problems; appt needed 10/13/18  Yes Lada, Satira Anis, MD     ALLERGIES:  No Known Allergies   SOCIAL HISTORY:  Social History   Socioeconomic History  . Marital status: Single    Spouse name: Not on file  . Number of children: 2  . Years of education: 9  . Highest education level: High school graduate  Occupational History  . Not on file  Social Needs  . Financial resource strain: Not hard at all  . Food insecurity    Worry: Never true    Inability: Never true  . Transportation needs    Medical: No     Non-medical: Not on file  Tobacco Use  . Smoking status: Former Smoker    Packs/day: 1.00    Years: 20.00    Pack years: 20.00    Types: Cigarettes    Quit date: 09/15/2015    Years since quitting: 3.5  . Smokeless tobacco: Never Used  Substance and Sexual Activity  . Alcohol use: Yes    Alcohol/week: 0.0 standard drinks    Comment: 3-6 cans/beer on weeknights, 6-12 beers on weekends.  . Drug use: No  . Sexual activity: Yes  Lifestyle  . Physical activity    Days per week: 0 days    Minutes per session: 0 min  . Stress: Not at all  Relationships  . Social connections    Talks on phone: More than three times a week    Gets together: Once a week    Attends religious service: Never    Active member of club or organization: No    Attends meetings of clubs or organizations: Never    Relationship status: Divorced  . Intimate partner violence    Fear of current or ex partner: Patient refused    Emotionally abused: Patient refused    Physically abused: Patient refused    Forced sexual activity: Patient refused  Other Topics Concern  . Not on file  Social History Narrative   Lives in [place] with his ex-girlfriend's brother.  Works @ [employer] in [employer].  Active @ work w/o limitations but does not routinely exercise.    The patient currently resides (home / rehab facility / nursing home): Home The patient normally is (ambulatory / bedbound): Ambulatory   FAMILY HISTORY:  Family History  Problem Relation Age of Onset  . Diabetes Mother   . Hypertension Father   . Heart disease Maternal Grandmother   . Heart attack Maternal Grandmother   . Diabetes Maternal Grandfather   . Diabetes Paternal Grandfather      REVIEW OF SYSTEMS:  Constitutional: denies weight loss, fever, chills, or sweats  Eyes: denies any other vision changes, history of eye injury  ENT: denies sore throat, hearing problems  Respiratory: Positive shortness of breath,  Cardiovascular:  Positive chest pain, palpitations  Gastrointestinal: denies abdominal pain, N/V, or diarrhea Genitourinary: denies burning with urination or urinary frequency Musculoskeletal: denies any other joint pains or cramps  Skin: denies any other rashes or skin discolorations  Neurological: denies any other headache, dizziness, weakness  Psychiatric: denies any other depression, anxiety   All other review of systems were negative   VITAL SIGNS:  Temp:  [97.7 F (36.5 C)-97.9 F (36.6 C)] 97.7 F (36.5 C) (07/14 1620) Pulse Rate:  [85-100] 90 (07/14 1620) Resp:  [11-24] 18 (07/14 1620) BP: (121-146)/(96-108) 133/97 (07/14 1620)  SpO2:  [91 %-100 %] 97 % (07/14 1620) Weight:  [99.3 kg] 99.3 kg (07/13 2107)     Height: 5\' 10"  (177.8 cm) Weight: 99.3 kg BMI (Calculated): 31.41   PHYSICAL EXAM:  Constitutional:  -- Normal body habitus  -- Awake, alert, and oriented x3  Eyes:  -- Pupils equally round and reactive to light  -- No scleral icterus  Ear, nose, and throat:  -- No jugular venous distension  Pulmonary:  -- No crackles  -- Equal breath sounds bilaterally -- Breathing non-labored at rest Cardiovascular:  -- S1, S2 present  -- No pericardial rubs Gastrointestinal:  -- Abdomen soft, nontender, non-distended, no guarding or rebound tenderness -- No abdominal masses appreciated, pulsatile or otherwise  Musculoskeletal and Integumentary:  -- Wounds or skin discoloration: None appreciated -- Extremities: B/L UE and LE FROM, hands and feet warm, no edema  Neurologic:  -- Motor function: intact and symmetric -- Sensation: intact and symmetric   Labs:  CBC Latest Ref Rng & Units 03/27/2019 03/26/2019 03/25/2019  WBC 4.0 - 10.5 K/uL 6.0 6.2 7.6  Hemoglobin 13.0 - 17.0 g/dL 12.0(L) 11.5(L) 12.9(L)  Hematocrit 39.0 - 52.0 % 38.5(L) 37.4(L) 43.2  Platelets 150 - 400 K/uL 280 280 291   CMP Latest Ref Rng & Units 03/26/2019 03/25/2019 03/24/2019  Glucose 70 - 99 mg/dL 93 87 126(H)  BUN 6  - 20 mg/dL 25(H) 32(H) 33(H)  Creatinine 0.61 - 1.24 mg/dL 1.09 1.58(H) 1.80(H)  Sodium 135 - 145 mmol/L 133(L) 137 137  Potassium 3.5 - 5.1 mmol/L 4.1 4.7 4.1  Chloride 98 - 111 mmol/L 105 105 107  CO2 22 - 32 mmol/L 19(L) 21(L) 18(L)  Calcium 8.9 - 10.3 mg/dL 7.9(L) 8.4(L) 8.2(L)  Total Protein 6.5 - 8.1 g/dL - - -  Total Bilirubin 0.3 - 1.2 mg/dL 2.2(H) - -  Alkaline Phos 38 - 126 U/L - - -  AST 15 - 41 U/L - - -  ALT 0 - 44 U/L - - -   Pending direct and indirect bilirubin.  Imaging studies:  EXAM: ULTRASOUND ABDOMEN LIMITED RIGHT UPPER QUADRANT  COMPARISON:  None.  FINDINGS: Gallbladder:  Several small stones are seen in the gallbladder in addition to wall thickening measuring up to 5.6 mm. No pericholecystic fluid, sludge, or Murphy's sign.  Common bile duct:  Diameter: 1.5 mm  Liver:  No focal mass. Portal vein is patent on color Doppler imaging with normal direction of blood flow towards the liver.  IMPRESSION: 1. There are several small stones in the gallbladder with gallbladder wall thickening measuring 5.6 mm. No sludge, pericholecystic fluid, or Murphy's sign. If there is continued concern for acute cholecystitis, recommend a HIDA scan.   Electronically Signed   By: Dorise Bullion III M.D   On: 03/27/2019 08:18  Assessment/Plan:  48 y.o. male with bilateral PE and acute systolic heart failure consulted for "cholelithiasis with elevated bilirubin", complicated by pertinent comorbidities including bilateral PE on anticoagulation, acute systolic heart failure, hypertension. Ultrasound of the abdomen was done showing gallstones.  Patient not complaining of abdominal pain.  She is tolerating regular diet without exacerbation abdominal pain.  Admission labs were done showing elevated bilirubin.  Surgery was consulted because of the finding of the stones and the elevated bilirubin.  There has been no work-up done for these findings.  This is a  difficult case because the patient is going through an acute episode of bilateral PE with significant heart strain and acute systolic heart  failure.  There has been no evaluation of the bilirubin to see if this is direct or indirect.  There is also no evaluation of choledocholithiasis which should be treated before cholecystectomy.  Even this patient has choledocholithiasis, he is not a candidate for surgical management at this moment with acute heart failure embolotherapy.  I will follow the MRCP and the partition of the bilirubin for further recommendation. With thickening may be due to the overload from the heart failure.  There is no pericholecystic fluid and no Murphy's sign, there is no leukocytosis, there is no abdominal pain.  This made the patient very low risk of cholecystitis.  At this moment I recommend complete work-up and continue management of his acute PE and systolic heart failure.  Patient denied the need for surgical intervention at this moment.  All of the above findings and recommendations were discussed with the patient and and all of patient's questions were answered to his expressed satisfaction.  Arnold Long, MD

## 2019-03-28 ENCOUNTER — Encounter: Payer: Self-pay | Admitting: Internal Medicine

## 2019-03-28 ENCOUNTER — Inpatient Hospital Stay: Payer: Managed Care, Other (non HMO)

## 2019-03-28 DIAGNOSIS — I420 Dilated cardiomyopathy: Secondary | ICD-10-CM

## 2019-03-28 DIAGNOSIS — I2609 Other pulmonary embolism with acute cor pulmonale: Principal | ICD-10-CM

## 2019-03-28 DIAGNOSIS — R17 Unspecified jaundice: Secondary | ICD-10-CM

## 2019-03-28 LAB — BASIC METABOLIC PANEL
Anion gap: 9 (ref 5–15)
BUN: 21 mg/dL — ABNORMAL HIGH (ref 6–20)
CO2: 24 mmol/L (ref 22–32)
Calcium: 7.8 mg/dL — ABNORMAL LOW (ref 8.9–10.3)
Chloride: 105 mmol/L (ref 98–111)
Creatinine, Ser: 1.03 mg/dL (ref 0.61–1.24)
GFR calc Af Amer: >60 mL/min (ref >60)
GFR calc non Af Amer: >60 mL/min (ref >60)
Glucose, Bld: 112 mg/dL — ABNORMAL HIGH (ref 70–99)
Potassium: 4 mmol/L (ref 3.5–5.1)
Sodium: 138 mmol/L (ref 135–145)

## 2019-03-28 LAB — CBC
HCT: 36.1 % — ABNORMAL LOW (ref 39.0–52.0)
Hemoglobin: 11.3 g/dL — ABNORMAL LOW (ref 13.0–17.0)
MCH: 27.6 pg (ref 26.0–34.0)
MCHC: 31.3 g/dL (ref 30.0–36.0)
MCV: 88 fL (ref 80.0–100.0)
Platelets: 277 10*3/uL (ref 150–400)
RBC: 4.1 MIL/uL — ABNORMAL LOW (ref 4.22–5.81)
RDW: 16 % — ABNORMAL HIGH (ref 11.5–15.5)
WBC: 4.7 10*3/uL (ref 4.0–10.5)
nRBC: 0 % (ref 0.0–0.2)

## 2019-03-28 LAB — HEPATIC FUNCTION PANEL
ALT: 141 U/L — ABNORMAL HIGH (ref 0–44)
AST: 103 U/L — ABNORMAL HIGH (ref 15–41)
Albumin: 2.7 g/dL — ABNORMAL LOW (ref 3.5–5.0)
Alkaline Phosphatase: 79 U/L (ref 38–126)
Bilirubin, Direct: 0.9 mg/dL — ABNORMAL HIGH (ref 0.0–0.2)
Indirect Bilirubin: 1.1 mg/dL — ABNORMAL HIGH (ref 0.3–0.9)
Total Bilirubin: 2 mg/dL — ABNORMAL HIGH (ref 0.3–1.2)
Total Protein: 5.9 g/dL — ABNORMAL LOW (ref 6.5–8.1)

## 2019-03-28 LAB — HEPARIN LEVEL (UNFRACTIONATED)
Heparin Unfractionated: 0.22 IU/mL — ABNORMAL LOW (ref 0.30–0.70)
Heparin Unfractionated: 0.29 IU/mL — ABNORMAL LOW (ref 0.30–0.70)

## 2019-03-28 MED ORDER — HEPARIN BOLUS VIA INFUSION
1200.0000 [IU] | Freq: Once | INTRAVENOUS | Status: AC
  Administered 2019-03-28: 01:00:00 1200 [IU] via INTRAVENOUS
  Filled 2019-03-28: qty 1200

## 2019-03-28 MED ORDER — RIVAROXABAN (XARELTO) VTE STARTER PACK (15 & 20 MG): ORAL_TABLET | ORAL | 0 refills | Status: DC

## 2019-03-28 MED ORDER — FUROSEMIDE 40 MG PO TABS: 40.0000 mg | ORAL_TABLET | Freq: Every day | ORAL | Status: DC

## 2019-03-28 MED ORDER — FUROSEMIDE 40 MG PO TABS: 40.0000 mg | ORAL_TABLET | Freq: Every day | ORAL | 1 refills | Status: DC

## 2019-03-28 MED ORDER — LOSARTAN POTASSIUM 25 MG PO TABS: 25.0000 mg | ORAL_TABLET | Freq: Every day | ORAL | 1 refills | Status: DC

## 2019-03-28 MED ORDER — GADOBUTROL 1 MMOL/ML IV SOLN
9.0000 mL | Freq: Once | INTRAVENOUS | Status: AC | PRN
  Administered 2019-03-28: 9 mL via INTRAVENOUS

## 2019-03-28 MED ORDER — CARVEDILOL 6.25 MG PO TABS: 6.2500 mg | ORAL_TABLET | Freq: Two times a day (BID) | ORAL | 1 refills | Status: DC

## 2019-03-28 MED ORDER — RIVAROXABAN 20 MG PO TABS: 20.0000 mg | ORAL_TABLET | Freq: Every day | ORAL | Status: DC

## 2019-03-28 MED ORDER — RIVAROXABAN 15 MG PO TABS
15.0000 mg | ORAL_TABLET | Freq: Two times a day (BID) | ORAL | Status: DC
  Administered 2019-03-28: 09:00:00 15 mg via ORAL
  Filled 2019-03-28 (×2): qty 1

## 2019-03-28 NOTE — Progress Notes (Signed)
ANTICOAGULATION CONSULT NOTE   Pharmacy Consult for Heparin Indication: pulmonary embolus  No Known Allergies  Patient Measurements: Height: 5\' 10"  (177.8 cm) Weight: 218 lb 14.7 oz (99.3 kg) IBW/kg (Calculated) : 73 Heparin Dosing Weight: 92.2 kg  Vital Signs: Temp: 97.4 F (36.3 C) (07/14 2003) Temp Source: Oral (07/14 2003) BP: 129/90 (07/14 2003) Pulse Rate: 106 (07/14 2003)  Labs: Recent Labs    03/25/19 0502 03/26/19 0454 03/26/19 1222 03/26/19 1809 03/27/19 0459 03/28/19 0000  HGB 12.9* 11.5*  --   --  12.0* 11.3*  HCT 43.2 37.4*  --   --  38.5* 36.1*  PLT 291 280  --   --  280 277  HEPARINUNFRC 0.37 0.23* 0.34 0.38 0.34 0.22*  CREATININE 1.58*  --  1.09  --   --  1.03    Estimated Creatinine Clearance: 104.7 mL/min (by C-G formula based on SCr of 1.03 mg/dL).   Assessment: 48 yo male here with PE. No oral anticoagulants noted on PTA med list.    7/10 Heparin infusion started @ 1550 units/hr.  7/10 @ 2235 HL: 0.20. Level is subtherapeutic.infusion increased to 1750 units/hr 7/11 @0530  HL 0.24 1400 unit IV x 1 and infusion increased to 1950 units/hr.  7/11 @1353  HL 0.39.  7/11 @1953  HL 0.40 7/12 @ 0502 HL 0.37 7/13 @ 0454 Heparin level 0.23. increase infusion rate to 2150units/hr. 7/13 @ 1222 HL 0.34   Goal of Therapy:  Heparin level 0.3-0.7 units/ml Monitor platelets by anticoagulation protocol: Yes   Plan:  07/15 @ 0000 HL 0.22 subtherapeutic. Confirmed w/ RN that drip had not been stopped for a long time, but only for a few minutes because patient is at MRI. Will rebolus w/ heparin 1200 units IV x 1 and will increase rate to 2300 units/hr and will recheck anti-Xa @ 0700, CBC stable will continue to monitor.  Tobie Lords, PharmD Clinical Pharmacist 03/28/2019 1:06 AM

## 2019-03-28 NOTE — Discharge Summary (Signed)
[facility] NAME: David Carlson    MR#:  [id number]  DATE OF BIRTH:  06-30-1971  DATE OF ADMISSION:  03/23/2019   ADMITTING PHYSICIAN: Loletha Grayer, MD  DATE OF DISCHARGE: 03/28/2019  PRIMARY CARE PHYSICIAN: Arnetha Courser, MD   ADMISSION DIAGNOSIS:  Pulmonary embolism ([place]) [I26.99] Acute kidney injury ([place]) [N17.9] Acute pulmonary embolism with acute cor pulmonale, unspecified pulmonary embolism type ([place]) [I26.09] DISCHARGE DIAGNOSIS:  Active Problems:   Pulmonary embolism ([facility])   Elevated troponin   Acute systolic heart failure ([place])   Cardiomyopathy ([place])  SECONDARY DIAGNOSIS:   Past Medical History:  Diagnosis Date  . Anxiety   . Broken shoulder   . Collar bone fracture    x 2  . Erectile dysfunction   . GERD (gastroesophageal reflux disease)   . H/O Bell's palsy   . Hyperlipidemia   . Hypertension   . Obesity   . Palpitations    a. Previously evaluted when he was incarcerated in [place] - reportedly nl stress test.  . PVC's (premature ventricular contractions)    a. 04/2013 Echo: EF 50-55%, Gr1 DD, mildly dil LA;  b. 04/2017 48hr holter: freq PVCs (5.4%), rare PACs. Single 13 beat atrial run - 143 bpm.   HOSPITAL COURSE:  1. Pulmonary embolism multifocal with possibility of pulmonary infarct. Hypercoagulable work-up sent off. ContinueHeparin drip. Patient has a chronic DVT right lower extremity per venous duplex.  I discussed with patient about oral anticoagulation options and side effects.  Start Xarelto today. 2. Acute kidney injury likely on chronic kidney disease stage III.     Improved withIV fluid hydration since the patient received IV contrast for CT scan of the chest. Resume losartan after discharge.  3. Hypertension. Continue Coreg and losartan, as an outpatient we will transition to [employer] if blood pressure will allow.  Add spironolactone as outpatient per Dr. Rockey Situ.     Cholelithiasis and elevated bilirubin.  RUQ ultrasound: There are several small stones in the gallbladder with gallbladder wall thickening measuring 5.6 mm.  Surgical consult. MRCP of abdomen per Dr. Zachery Dauer. MRCP: 1. Gallbladder collapsed.  No evidence of acute cholecystitis. 2. No biliary duct dilatation. Common bile duct normal. No choledocholithiasis.  Follow-up Dr. Zachery Dauer. as outpatient. I discussed with Dr. Rockey Situ and Dr. Zachery Dauer. DISCHARGE CONDITIONS:  Stable, discharge to home today. CONSULTS OBTAINED:  Treatment Team:  Wellington Hampshire, MD Herbert Pun, MD DRUG ALLERGIES:  No Known Allergies DISCHARGE MEDICATIONS:   Allergies as of 03/28/2019   No Known Allergies     Medication List    STOP taking these medications   metoprolol tartrate 25 MG tablet Commonly known as: LOPRESSOR     TAKE these medications   albuterol 108 (90 Base) MCG/ACT inhaler Commonly known as: VENTOLIN HFA Inhale 2 puffs into the lungs every 6 (six) hours as needed for wheezing or shortness of breath.   atorvastatin 20 MG tablet Commonly known as: LIPITOR Take 1 tablet (20 mg total) by mouth daily at 6 PM.   carvedilol 6.25 MG tablet Commonly known as: COREG Take 1 tablet (6.25 mg total) by mouth 2 (two) times daily with a meal.   cyclobenzaprine 10 MG tablet Commonly known as: FLEXERIL Take 10 mg by mouth 3 (three) times daily as needed. for muscle spams   fluticasone 50 MCG/ACT nasal spray Commonly known as: FLONASE Place 2 sprays into both nostrils daily.   furosemide 40 MG tablet Commonly  known as: LASIX Take 1 tablet (40 mg total) by mouth daily. Start taking on: March 29, 2019   losartan 25 MG tablet Commonly known as: Cozaar Take 1 tablet (25 mg total) by mouth daily. What changed:   medication strength  how much to take   pantoprazole 40 MG tablet Commonly known as: PROTONIX Take 1 tablet (40 mg total) by mouth daily as needed. Caution:prolonged  use may cause health problems; appt needed What changed: reasons to take this   Rivaroxaban 15 & 20 MG Tbpk Take as directed on package: Start with one 15mg  tablet by mouth twice a day with food. On Day 22, switch to one 20mg  tablet once a day with food.        DISCHARGE INSTRUCTIONS:  See AVS.  If you experience worsening of your admission symptoms, develop shortness of breath, life threatening emergency, suicidal or homicidal thoughts you must seek medical attention immediately by calling 911 or calling your MD immediately  if symptoms less severe.  You Must read complete instructions/literature along with all the possible adverse reactions/side effects for all the Medicines you take and that have been prescribed to you. Take any new Medicines after you have completely understood and accpet all the possible adverse reactions/side effects.   Please note  You were cared for by a hospitalist during your hospital stay. If you have any questions about your discharge medications or the care you received while you were in the hospital after you are discharged, you can call the unit and asked to speak with the hospitalist on call if the hospitalist that took care of you is not available. Once you are discharged, your primary care physician will handle any further medical issues. Please note that NO REFILLS for any discharge medications will be authorized once you are discharged, as it is imperative that you return to your primary care physician (or establish a relationship with a primary care physician if you do not have one) for your aftercare needs so that they can reassess your need for medications and monitor your lab values.    On the day of Discharge:  VITAL SIGNS:  Blood pressure 118/88, pulse 89, temperature 98.1 F (36.7 C), temperature source Oral, resp. rate 14, height 5\' 10"  (1.778 m), weight 99.3 kg, SpO2 93 %. PHYSICAL EXAMINATION:  GENERAL:  48 y.o.-year-old patient lying in the  bed with no acute distress.  EYES: Pupils equal, round, reactive to light and accommodation. No scleral icterus. Extraocular muscles intact.  HEENT: Head atraumatic, normocephalic. Oropharynx and nasopharynx clear.  NECK:  Supple, no jugular venous distention. No thyroid enlargement, no tenderness.  LUNGS: Normal breath sounds bilaterally, no wheezing, rales,rhonchi or crepitation. No use of accessory muscles of respiration.  CARDIOVASCULAR: S1, S2 normal. No murmurs, rubs, or gallops.  ABDOMEN: Soft, non-tender, non-distended. Bowel sounds present. No organomegaly or mass.  EXTREMITIES: No pedal edema, cyanosis, or clubbing.  NEUROLOGIC: Cranial nerves II through XII are intact. Muscle strength 5/5 in all extremities. Sensation intact. Gait not checked.  PSYCHIATRIC: The patient is alert and oriented x 3.  SKIN: No obvious rash, lesion, or ulcer.  DATA REVIEW:   CBC Recent Labs  Lab 03/28/19 0000  WBC 4.7  HGB 11.3*  HCT 36.1*  PLT 277    Chemistries  Recent Labs  Lab 03/25/19 0502  03/28/19 0000 03/28/19 0700  NA 137   < > 138  --   K 4.7   < > 4.0  --  CL 105   < > 105  --   CO2 21*   < > 24  --   GLUCOSE 87   < > 112*  --   BUN 32*   < > 21*  --   CREATININE 1.58*   < > 1.03  --   CALCIUM 8.4*   < > 7.8*  --   MG 1.7  --   --   --   AST  --    < >  --  103*  ALT  --    < >  --  141*  ALKPHOS  --    < >  --  79  BILITOT  --    < >  --  2.0*   < > = values in this interval not displayed.     Microbiology Results  Results for orders placed or performed during the hospital encounter of 03/23/19  SARS Coronavirus 2 (CEPHEID - Performed in [facility] hospital lab), Hosp Order     Status: None   Collection Time: 03/23/19 12:49 PM   Specimen: Nasopharyngeal Swab  Result Value Ref Range Status   SARS Coronavirus 2 NEGATIVE NEGATIVE Final    Comment: (NOTE) If result is NEGATIVE SARS-CoV-2 target nucleic acids are NOT DETECTED. The SARS-CoV-2 RNA is generally  detectable in upper and lower  respiratory specimens during the acute phase of infection. The lowest  concentration of SARS-CoV-2 viral copies this assay can detect is 250  copies / mL. A negative result does not preclude SARS-CoV-2 infection  and should not be used as the sole basis for treatment or other  patient management decisions.  A negative result may occur with  improper specimen collection / handling, submission of specimen other  than nasopharyngeal swab, presence of viral mutation(s) within the  areas targeted by this assay, and inadequate number of viral copies  (<250 copies / mL). A negative result must be combined with clinical  observations, patient history, and epidemiological information. If result is POSITIVE SARS-CoV-2 target nucleic acids are DETECTED. The SARS-CoV-2 RNA is generally detectable in upper and lower  respiratory specimens dur ing the acute phase of infection.  Positive  results are indicative of active infection with SARS-CoV-2.  Clinical  correlation with patient history and other diagnostic information is  necessary to determine patient infection status.  Positive results do  not rule out bacterial infection or co-infection with other viruses. If result is PRESUMPTIVE POSTIVE SARS-CoV-2 nucleic acids MAY BE PRESENT.   A presumptive positive result was obtained on the submitted specimen  and confirmed on repeat testing.  While 2019 novel coronavirus  (SARS-CoV-2) nucleic acids may be present in the submitted sample  additional confirmatory testing may be necessary for epidemiological  and / or clinical management purposes  to differentiate between  SARS-CoV-2 and other Sarbecovirus currently known to infect humans.  If clinically indicated additional testing with an alternate test  methodology [phone]) is advised. The SARS-CoV-2 RNA is generally  detectable in upper and lower respiratory sp ecimens during the acute  phase of infection. The  expected result is Negative. Fact Sheet for Patients:  StrictlyIdeas.no Fact Sheet for Healthcare Providers: BankingDealers.co.za This test is not yet approved or cleared by the [place] FDA and has been authorized for detection and/or diagnosis of SARS-CoV-2 by FDA under an Emergency Use Authorization (EUA).  This EUA will remain in effect (meaning this test can be used) for the duration of the COVID-19 declaration under  Section 564(b)(1) of the Act, 21 U.S.C. section 360bbb-3(b)(1), unless the authorization is terminated or revoked sooner. Performed at [facility], [address] [place], [place] [address]     RADIOLOGY:  No results found.   Management plans discussed with the patient, family and they are in agreement.  CODE STATUS: Full Code   TOTAL TIME TAKING CARE OF THIS PATIENT: 42 minutes.    Demetrios Loll M.D on 03/28/2019 at 12:16 PM  Between 7am to 6pm - Pager - [phone]  After 6pm go to [email or web address] - Proofreader  [facility]  Office  [phone]  CC: Primary care physician; Arnetha Courser, MD   Note: This dictation was prepared with Dragon dictation along with smaller phrase technology. Any transcriptional errors that result from this process are unintentional.

## 2019-03-28 NOTE — Progress Notes (Signed)
Progress Note  Patient Name: David Carlson Date of Encounter: 03/28/2019  Primary Cardiologist: [facility], [place]  Subjective   Cardiac catheterization yesterday, nonobstructive disease Nonischemic cardiomyopathy Severely elevated left ventricular filling pressures noted  Recovering today, slowly improving  He does report some right lower chest discomfort radiating through to his back  Ultrasound performed recently showing gallstones, no significant abdominal pain  He is completed MRCP with and without contrast, results pending   Inpatient Medications    Scheduled Meds: . atorvastatin  20 mg Oral q1800  . carvedilol  6.25 mg Oral BID WC  . fluticasone  2 spray Each Nare Daily  . [START ON 03/29/2019] furosemide  40 mg Oral Daily  . Rivaroxaban  15 mg Oral BID WC  . [START ON 04/18/2019] rivaroxaban  20 mg Oral Q breakfast  . sodium chloride flush  3 mL Intravenous Q12H  . sodium chloride flush  3 mL Intravenous Q12H   Continuous Infusions: . sodium chloride     PRN Meds: sodium chloride, albuterol, hydrALAZINE, ondansetron **OR** ondansetron (ZOFRAN) IV, oxyCODONE, pantoprazole, sodium chloride flush   Vital Signs    Vitals:   03/27/19 2003 03/28/19 0520 03/28/19 0520 03/28/19 0857  BP: 129/90 109/83  118/88  Pulse: (!) 106 88 87 89  Resp: 15 14    Temp: (!) 97.4 F (36.3 C) 98.1 F (36.7 C)    TempSrc: Oral Oral    SpO2: 98% 91% 93%   Weight:      Height:        Intake/Output Summary (Last 24 hours) at 03/28/2019 1327 Last data filed at 03/28/2019 0900 Gross per 24 hour  Intake 1604.79 ml  Output 1500 ml  Net 104.79 ml   Last 3 Weights 03/26/2019 03/25/2019 03/23/2019  Weight (lbs) 218 lb 14.7 oz 214 lb 4.6 oz 208 lb  Weight (kg) 99.3 kg 97.2 kg 94.348 kg      Telemetry    Normal sinus rhythm- Personally Reviewed  ECG     - Personally Reviewed  Physical Exam   GEN: No acute distress.   Neck: No JVD Cardiac: RRR, no murmurs,  rubs, or gallops.  Respiratory: Clear to auscultation bilaterally. GI: Soft, nontender, non-distended  MS: No edema; No deformity. Neuro:  Nonfocal  Psych: Normal affect   Labs    High Sensitivity Troponin:  No results for input(s): TROPONINIHS in the last 720 hours.    Cardiac EnzymesNo results for input(s): TROPONINI in the last 168 hours. No results for input(s): TROPIPOC in the last 168 hours.   Chemistry Recent Labs  Lab 03/23/19 1231  03/25/19 0502 03/26/19 0454 03/26/19 1222 03/27/19 1736 03/28/19 0000 03/28/19 0700  NA 136   < > 137  --  133*  --  138  --   K 3.5   < > 4.7  --  4.1  --  4.0  --   CL 103   < > 105  --  105  --  105  --   CO2 23   < > 21*  --  19*  --  24  --   GLUCOSE 93   < > 87  --  93  --  112*  --   BUN 30*   < > 32*  --  25*  --  21*  --   CREATININE 2.24*   < > 1.58*  --  1.09  --  1.03  --   CALCIUM 8.8*   < >  8.4*  --  7.9*  --  7.8*  --   PROT 7.1  --   --   --   --  6.0*  --  5.9*  ALBUMIN 3.5  --   --   --   --  2.9*  --  2.7*  AST 87*  --   --   --   --  135*  --  103*  ALT 180*  --   --   --   --  163*  --  141*  ALKPHOS 67  --   --   --   --  75  --  79  BILITOT 2.8*  --   --  2.2*  --  2.1*  --  2.0*  GFRNONAA 34*   < > 51*  --  >60  --  >60  --   GFRAA 39*   < > 59*  --  >60  --  >60  --   ANIONGAP 10   < > 11  --  9  --  9  --    < > = values in this interval not displayed.     Hematology Recent Labs  Lab 03/26/19 0454 03/27/19 0459 03/28/19 0000  WBC 6.2 6.0 4.7  RBC 4.26 4.32 4.10*  HGB 11.5* 12.0* 11.3*  HCT 37.4* 38.5* 36.1*  MCV 87.8 89.1 88.0  MCH 27.0 27.8 27.6  MCHC 30.7 31.2 31.3  RDW 16.0* 16.1* 16.0*  PLT 280 280 277    BNPNo results for input(s): BNP, PROBNP in the last 168 hours.   DDimer No results for input(s): DDIMER in the last 168 hours.   Radiology    US Abdomen Limited Ruq  Result Date: 03/27/2019 CLINICAL DATA:  Elevated LFTs. EXAM: ULTRASOUND ABDOMEN LIMITED RIGHT UPPER QUADRANT  COMPARISON:  None. FINDINGS: Gallbladder: Several small stones are seen in the gallbladder in addition to wall thickening measuring up to 5.6 mm. No pericholecystic fluid, sludge, or Murphy's sign. Common bile duct: Diameter: 1.5 mm Liver: No focal mass. Portal vein is patent on color Doppler imaging with normal direction of blood flow towards the liver. IMPRESSION: 1. There are several small stones in the gallbladder with gallbladder wall thickening measuring 5.6 mm. No sludge, pericholecystic fluid, or Murphy's sign. If there is continued concern for acute cholecystitis, recommend a HIDA scan. Electronically Signed   By: Dorise Bullion III M.D   On: 03/27/2019 08:18    Cardiac Studies   Echocardiogram   1. The left ventricle has severely reduced systolic function, with an ejection fraction of 20-25%. The cavity size was mildly dilated. Left ventricular diastolic Doppler parameters are consistent with restrictive filling. Left ventrical global  hypokinesis without regional wall motion abnormalities.  2. The right ventricle has mildly reduced systolic function. The cavity was normal. There is no increase in right ventricular wall thickness. Right ventricular systolic pressure is mild to moderately elevated with an estimated pressure of 42.5 mmHg.  3. Mitral valve regurgitation is mild to moderate.  4. Tricuspid valve regurgitation is mild-moderate.  5. Left atrial size was moderately dilated.  6. Right atrial size was mildly dilated.  7. Small pericardial effusion outside the LV free wall.  Patient Profile     David Carlson is a 48 y.o. male with a hx of palpitations, hypertension, hyperlipidemia, obesity, GERD, anxiety, PVCs, and erectile dysfunction, who is being seen for  HFrEF / cardiomyopathy with EF 25%   Assessment & Plan  Acute systolic CHF Ejection fraction 20 to 25% In the setting of bilateral PE, chest pain Cardiac catheterization performed yesterday with  nonobstructive disease -Continue Coreg 6.25 twice daily, Lasix 40 daily,  Given blood pressure borderline low would start losartan 25 daily As an outpatient we will transition to [facility] if blood pressure will allow.  Add spironolactone as outpatient  Bilateral pulmonary embolism Shortness of breath, some chest discomfort on inspiration on the right side radiating through to his back On Xarelto 15 twice daily  Long discussion about differential of his nonischemic cardiomyopathy, management, medications needed, management of PE, timing on going back to work, disability  Total encounter time more than 35 minutes  Greater than 50% was spent in counseling and coordination of care with the patient    For questions or updates, please contact [place] Please consult [email or web address] for contact info under        Signed, Ida Rogue, MD  03/28/2019, 1:27 PM

## 2019-03-28 NOTE — TOC Progression Note (Signed)
Transition of Care [facility]) - Progression Note    Patient Details  Name: CELESTINO ACKERMAN MRN: [id number] Date of Birth: 1971-06-11  Transition of Care [facility]) CM/SW Contact  Shelbie Hutching, RN Phone Number: 03/28/2019, 11:39 AM  Clinical Narrative:     Patient will discharge home on xarelto.  30 day free Xarelto coupon given to patient.  Patient has no other discharge needs.        Expected Discharge Plan and Services           Expected Discharge Date: 03/28/19                                     Social Determinants of Health (SDOH) Interventions    Readmission Risk Interventions No flowsheet data found.

## 2019-03-28 NOTE — Progress Notes (Signed)
ANTICOAGULATION CONSULT NOTE - Initial Consult  Pharmacy Consult for Xarelto Indication: pulmonary embolus  Transition from Heparin drip  No Known Allergies  Patient Measurements: Height: 5\' 10"  (177.8 cm) Weight: 218 lb 14.7 oz (99.3 kg) IBW/kg (Calculated) : 73 Heparin Dosing Weight:    Vital Signs: Temp: 98.1 F (36.7 C) (07/15 0520) Temp Source: Oral (07/15 0520) BP: 109/83 (07/15 0520) Pulse Rate: 87 (07/15 0520)  Labs: Recent Labs    03/26/19 0454 03/26/19 1222  03/27/19 0459 03/28/19 0000 03/28/19 0700  HGB 11.5*  --   --  12.0* 11.3*  --   HCT 37.4*  --   --  38.5* 36.1*  --   PLT 280  --   --  280 277  --   HEPARINUNFRC 0.23* 0.34   < > 0.34 0.22* 0.29*  CREATININE  --  1.09  --   --  1.03  --    < > = values in this interval not displayed.    Estimated Creatinine Clearance: 104.7 mL/min (by C-G formula based on SCr of 1.03 mg/dL).   Medical History: Past Medical History:  Diagnosis Date  . Anxiety   . Broken shoulder   . Collar bone fracture    x 2  . Erectile dysfunction   . GERD (gastroesophageal reflux disease)   . H/O Bell's palsy   . Hyperlipidemia   . Hypertension   . Obesity   . Palpitations    a. Previously evaluted when he was incarcerated in [place] - reportedly nl stress test.  . PVC's (premature ventricular contractions)    a. 04/2013 Echo: EF 50-55%, Gr1 DD, mildly dil LA;  b. 04/2017 48hr holter: freq PVCs (5.4%), rare PACs. Single 13 beat atrial run - 143 bpm.    Medications:  Scheduled:  . atorvastatin  20 mg Oral q1800  . carvedilol  6.25 mg Oral BID WC  . fluticasone  2 spray Each Nare Daily  . furosemide  40 mg Intravenous BID  . Rivaroxaban  15 mg Oral BID WC  . [START ON 04/18/2019] rivaroxaban  20 mg Oral Q breakfast  . sodium chloride flush  3 mL Intravenous Q12H  . sodium chloride flush  3 mL Intravenous Q12H    Assessment: 48 yo male here with PE. No oral anticoagulants noted on PTA med list. Started on Heparin drip, now  to transition to Xarelto   Goal of Therapy:  Monitor platelets by anticoagulation protocol: Yes   Plan:  D/c Heparin drip when give first dose of Xarelto. Xarelto for DVT treatment: 15 mg BIDw/food x 21 days, then 20 mg Daily w/ food ).  F/u CBC/Scr per protocol  Blonnie Maske A 03/28/2019,7:41 AM

## 2019-03-28 NOTE — Plan of Care (Signed)
Pt d/ced home.  

## 2019-03-29 ENCOUNTER — Other Ambulatory Visit: Payer: Managed Care, Other (non HMO)

## 2019-03-29 ENCOUNTER — Telehealth: Payer: Self-pay

## 2019-03-29 NOTE — Telephone Encounter (Signed)
Transition Care Management Follow-up Telephone Call  Date of discharge and from where: 7/15 [facility]  How have you been since you were released from the hospital? Pt states he is feeling better but c/o left knee pain of 8/10 that started last week and went away. He is using a medicated patch for pain.   Any questions or concerns? No   Items Reviewed:  Did the pt receive and understand the discharge instructions provided? Yes   Medications obtained and verified? Yes   Any new allergies since your discharge? No   Dietary orders reviewed? Yes  Do you have support at home? Yes   Functional Questionnaire: (I = Independent and D = Dependent) ADLs: I  Bathing/Dressing- I  Meal Prep- I  Eating- I  Maintaining continence- I  Transferring/Ambulation- I  Managing Meds- I  Follow up appointments reviewed:   PCP Hospital f/u appt confirmed? Yes  Scheduled to see Suezanne Cheshire NP on 04/05/19 @ 10:00.  [facility] f/u appt confirmed? Yes  Scheduled to see Dr. Rockey Situ on 7/22 @ 11:40.  Are transportation arrangements needed? No   If their condition worsens, is the pt aware to call PCP or go to the Emergency Dept.? Yes  Was the patient provided with contact information for the PCP's office or ED? Yes  Was to pt encouraged to call back with questions or concerns? Yes

## 2019-03-30 ENCOUNTER — Encounter: Payer: Self-pay | Admitting: *Deleted

## 2019-03-30 ENCOUNTER — Other Ambulatory Visit: Payer: Self-pay

## 2019-03-30 ENCOUNTER — Telehealth: Payer: Self-pay | Admitting: Cardiovascular Disease

## 2019-03-30 ENCOUNTER — Inpatient Hospital Stay
Admission: EM | Admit: 2019-03-30 | Discharge: 2019-04-03 | DRG: 377 | Disposition: A | Discharge status: Home / Self Care | Payer: Managed Care, Other (non HMO) | Attending: Internal Medicine | Admitting: Internal Medicine

## 2019-03-30 DIAGNOSIS — K761 Chronic passive congestion of liver: Secondary | ICD-10-CM | POA: Diagnosis present

## 2019-03-30 DIAGNOSIS — Z87891 Personal history of nicotine dependence: Secondary | ICD-10-CM | POA: Diagnosis not present

## 2019-03-30 DIAGNOSIS — I2510 Atherosclerotic heart disease of native coronary artery without angina pectoris: Secondary | ICD-10-CM | POA: Diagnosis present

## 2019-03-30 DIAGNOSIS — K648 Other hemorrhoids: Secondary | ICD-10-CM | POA: Diagnosis present

## 2019-03-30 DIAGNOSIS — K219 Gastro-esophageal reflux disease without esophagitis: Secondary | ICD-10-CM | POA: Diagnosis present

## 2019-03-30 DIAGNOSIS — E781 Pure hyperglyceridemia: Secondary | ICD-10-CM | POA: Diagnosis present

## 2019-03-30 DIAGNOSIS — I2699 Other pulmonary embolism without acute cor pulmonale: Secondary | ICD-10-CM | POA: Diagnosis present

## 2019-03-30 DIAGNOSIS — K922 Gastrointestinal hemorrhage, unspecified: Secondary | ICD-10-CM | POA: Diagnosis present

## 2019-03-30 DIAGNOSIS — I82501 Chronic embolism and thrombosis of unspecified deep veins of right lower extremity: Secondary | ICD-10-CM | POA: Diagnosis present

## 2019-03-30 DIAGNOSIS — Z833 Family history of diabetes mellitus: Secondary | ICD-10-CM | POA: Diagnosis not present

## 2019-03-30 DIAGNOSIS — E785 Hyperlipidemia, unspecified: Secondary | ICD-10-CM | POA: Diagnosis present

## 2019-03-30 DIAGNOSIS — K921 Melena: Principal | ICD-10-CM | POA: Diagnosis present

## 2019-03-30 DIAGNOSIS — Z7951 Long term (current) use of inhaled steroids: Secondary | ICD-10-CM

## 2019-03-30 DIAGNOSIS — I429 Cardiomyopathy, unspecified: Secondary | ICD-10-CM | POA: Diagnosis present

## 2019-03-30 DIAGNOSIS — I110 Hypertensive heart disease with heart failure: Secondary | ICD-10-CM | POA: Diagnosis present

## 2019-03-30 DIAGNOSIS — Z8249 Family history of ischemic heart disease and other diseases of the circulatory system: Secondary | ICD-10-CM | POA: Diagnosis not present

## 2019-03-30 DIAGNOSIS — M659 Synovitis and tenosynovitis, unspecified: Secondary | ICD-10-CM | POA: Diagnosis present

## 2019-03-30 DIAGNOSIS — R748 Abnormal levels of other serum enzymes: Secondary | ICD-10-CM | POA: Diagnosis not present

## 2019-03-30 DIAGNOSIS — Z1159 Encounter for screening for other viral diseases: Secondary | ICD-10-CM

## 2019-03-30 DIAGNOSIS — K625 Hemorrhage of anus and rectum: Secondary | ICD-10-CM | POA: Diagnosis not present

## 2019-03-30 DIAGNOSIS — R52 Pain, unspecified: Secondary | ICD-10-CM

## 2019-03-30 DIAGNOSIS — I5020 Unspecified systolic (congestive) heart failure: Secondary | ICD-10-CM | POA: Diagnosis present

## 2019-03-30 LAB — TYPE AND SCREEN
ABO/RH(D): O POS
Antibody Screen: NEGATIVE

## 2019-03-30 LAB — COMPREHENSIVE METABOLIC PANEL
ALT: 95 U/L — ABNORMAL HIGH (ref 0–44)
AST: 51 U/L — ABNORMAL HIGH (ref 15–41)
Albumin: 3.4 g/dL — ABNORMAL LOW (ref 3.5–5.0)
Alkaline Phosphatase: 96 U/L (ref 38–126)
Anion gap: 10 (ref 5–15)
BUN: 13 mg/dL (ref 6–20)
CO2: 24 mmol/L (ref 22–32)
Calcium: 8.4 mg/dL — ABNORMAL LOW (ref 8.9–10.3)
Chloride: 105 mmol/L (ref 98–111)
Creatinine, Ser: 0.92 mg/dL (ref 0.61–1.24)
GFR calc Af Amer: >60 mL/min (ref >60)
GFR calc non Af Amer: >60 mL/min (ref >60)
Glucose, Bld: 141 mg/dL — ABNORMAL HIGH (ref 70–99)
Potassium: 3.5 mmol/L (ref 3.5–5.1)
Sodium: 139 mmol/L (ref 135–145)
Total Bilirubin: 1.7 mg/dL — ABNORMAL HIGH (ref 0.3–1.2)
Total Protein: 7.2 g/dL (ref 6.5–8.1)

## 2019-03-30 LAB — CBC
HCT: 40.2 % (ref 39.0–52.0)
Hemoglobin: 12.4 g/dL — ABNORMAL LOW (ref 13.0–17.0)
MCH: 27.3 pg (ref 26.0–34.0)
MCHC: 30.8 g/dL (ref 30.0–36.0)
MCV: 88.4 fL (ref 80.0–100.0)
Platelets: 313 10*3/uL (ref 150–400)
RBC: 4.55 MIL/uL (ref 4.22–5.81)
RDW: 15.9 % — ABNORMAL HIGH (ref 11.5–15.5)
WBC: 5.6 10*3/uL (ref 4.0–10.5)
nRBC: 0 % (ref 0.0–0.2)

## 2019-03-30 LAB — SARS CORONAVIRUS 2 BY RT PCR (HOSPITAL ORDER, PERFORMED IN ~~LOC~~ HOSPITAL LAB): SARS Coronavirus 2: NEGATIVE

## 2019-03-30 MED ORDER — SODIUM CHLORIDE 0.9 % IV BOLUS
1000.0000 mL | Freq: Once | INTRAVENOUS | Status: AC
  Administered 2019-03-30: 1000 mL via INTRAVENOUS

## 2019-03-30 MED ORDER — FLUTICASONE PROPIONATE 50 MCG/ACT NA SUSP
2.0000 | Freq: Every day | NASAL | Status: DC
  Administered 2019-03-31 – 2019-04-03 (×4): 2 via NASAL
  Filled 2019-03-30: qty 16

## 2019-03-30 MED ORDER — ACETAMINOPHEN 325 MG PO TABS: 650.0000 mg | ORAL_TABLET | Freq: Four times a day (QID) | ORAL | Status: DC | PRN

## 2019-03-30 MED ORDER — ATORVASTATIN CALCIUM 20 MG PO TABS
20.0000 mg | ORAL_TABLET | Freq: Every day | ORAL | Status: DC
  Administered 2019-03-31 – 2019-04-02 (×3): 20 mg via ORAL
  Filled 2019-03-30 (×3): qty 1

## 2019-03-30 MED ORDER — CARVEDILOL 6.25 MG PO TABS
6.2500 mg | ORAL_TABLET | Freq: Two times a day (BID) | ORAL | Status: DC
  Administered 2019-03-31 – 2019-04-03 (×7): 6.25 mg via ORAL
  Filled 2019-03-30 (×7): qty 1

## 2019-03-30 MED ORDER — POLYETHYLENE GLYCOL 3350 17 G PO PACK: 17.0000 g | PACK | Freq: Every day | ORAL | Status: DC | PRN

## 2019-03-30 MED ORDER — LOSARTAN POTASSIUM 25 MG PO TABS
25.0000 mg | ORAL_TABLET | Freq: Every day | ORAL | Status: DC
  Administered 2019-03-31 – 2019-04-03 (×4): 25 mg via ORAL
  Filled 2019-03-30 (×4): qty 1

## 2019-03-30 MED ORDER — ONDANSETRON HCL 4 MG PO TABS: 4.0000 mg | ORAL_TABLET | Freq: Four times a day (QID) | ORAL | Status: DC | PRN

## 2019-03-30 MED ORDER — ALBUTEROL SULFATE (2.5 MG/3ML) 0.083% IN NEBU: 2.5000 mg | INHALATION_SOLUTION | Freq: Four times a day (QID) | RESPIRATORY_TRACT | Status: DC | PRN

## 2019-03-30 MED ORDER — ONDANSETRON HCL 4 MG/2ML IJ SOLN: 4.0000 mg | Freq: Four times a day (QID) | INTRAMUSCULAR | Status: DC | PRN

## 2019-03-30 MED ORDER — CYCLOBENZAPRINE HCL 10 MG PO TABS: 10.0000 mg | ORAL_TABLET | Freq: Three times a day (TID) | ORAL | Status: DC | PRN

## 2019-03-30 MED ORDER — FUROSEMIDE 40 MG PO TABS
40.0000 mg | ORAL_TABLET | Freq: Every day | ORAL | Status: DC
  Administered 2019-03-31 – 2019-04-03 (×4): 40 mg via ORAL
  Filled 2019-03-30 (×4): qty 1

## 2019-03-30 MED ORDER — ACETAMINOPHEN 650 MG RE SUPP: 650.0000 mg | Freq: Four times a day (QID) | RECTAL | Status: DC | PRN

## 2019-03-30 MED ORDER — PANTOPRAZOLE SODIUM 40 MG IV SOLR
40.0000 mg | Freq: Two times a day (BID) | INTRAVENOUS | Status: DC
  Administered 2019-03-31 – 2019-04-01 (×5): 40 mg via INTRAVENOUS
  Filled 2019-03-30 (×5): qty 40

## 2019-03-30 NOTE — ED Provider Notes (Signed)
[facility] Emergency Department Provider Note   ____________________________________________   First MD Initiated Contact with Patient 03/30/19 1915     (approximate)  I have reviewed the triage vital signs and the nursing notes.   HISTORY  Chief Complaint GI Bleeding   HPI David Carlson is a 48 y.o. male he was started on Xarelto a few days ago for pulmonary embolus.  He reports today he has been having bright red blood per rectum even just passing gas makes him have bright red blood per rectum.  He is tachycardic.           Past Medical History:  Diagnosis Date  . Anxiety   . Broken shoulder   . Collar bone fracture    x 2  . Erectile dysfunction   . GERD (gastroesophageal reflux disease)   . H/O Bell's palsy   . Hyperlipidemia   . Hypertension   . Obesity   . Palpitations    a. Previously evaluted when he was incarcerated in [place] - reportedly nl stress test.  . PVC's (premature ventricular contractions)    a. 04/2013 Echo: EF 50-55%, Gr1 DD, mildly dil LA;  b. 04/2017 48hr holter: freq PVCs (5.4%), rare PACs. Single 13 beat atrial run - 143 bpm.    Patient Active Problem List   Diagnosis Date Noted  . Cardiomyopathy ([place])   . Elevated troponin   . Acute systolic heart failure ([place])   . Pulmonary embolism ([place]) 03/23/2019  . Medication monitoring encounter 12/20/2017  . Annual physical exam 10/15/2016  . Vitamin D deficiency 01/13/2016  . Erectile dysfunction 01/13/2016  . Encounter for annual health examination 10/01/2015  . Fatigue 10/01/2015  . Left inguinal pain 07/10/2015  . GERD (gastroesophageal reflux disease) 03/14/2015  . Anxiety disorder 03/14/2015  . Hypercholesterolemia with hypertriglyceridemia 03/14/2015  . BP (high blood pressure) 03/14/2015  . Dermatitis, eczematoid 03/14/2015  . Headache, tension-type 03/14/2015  . Cervical pain 03/14/2015    Past Surgical History:  Procedure Laterality Date  . CARPAL  TUNNEL RELEASE Bilateral   . HERNIA REPAIR  2015  . HIP SURGERY     a. Age 76 - failure of growth plate to close - Pins inserted  . LEFT HEART CATH AND CORONARY ANGIOGRAPHY N/A 03/27/2019   Procedure: LEFT HEART CATH AND CORONARY ANGIOGRAPHY;  Surgeon: Nelva Bush, MD;  Location: [facility];  Service: Cardiovascular;  Laterality: N/A;    Prior to Admission medications   Medication Sig Start Date End Date Taking? Authorizing Provider  albuterol (VENTOLIN HFA) 108 (90 Base) MCG/ACT inhaler Inhale 2 puffs into the lungs every 6 (six) hours as needed for wheezing or shortness of breath. 01/30/19  Yes Hubbard Hartshorn, FNP  atorvastatin (LIPITOR) 20 MG tablet Take 1 tablet (20 mg total) by mouth daily at 6 PM. 10/13/18  Yes Lada, Satira Anis, MD  carvedilol (COREG) 6.25 MG tablet Take 1 tablet (6.25 mg total) by mouth 2 (two) times daily with a meal. 03/28/19 05/27/19 Yes Demetrios Loll, MD  cyclobenzaprine (FLEXERIL) 10 MG tablet Take 10 mg by mouth 3 (three) times daily as needed. for muscle spams 08/27/18  Yes [provider]  fluticasone (FLONASE) 50 MCG/ACT nasal spray Place 2 sprays into both nostrils daily. 01/30/19  Yes Hubbard Hartshorn, FNP  furosemide (LASIX) 40 MG tablet Take 1 tablet (40 mg total) by mouth daily. 03/29/19  Yes Demetrios Loll, MD  losartan (COZAAR) 25 MG tablet Take 1  tablet (25 mg total) by mouth daily. 03/28/19 03/27/20 Yes Demetrios Loll, MD  Multiple Vitamin (MULTIVITAMIN WITH MINERALS) TABS tablet Take 1 tablet by mouth daily.   Yes [provider]  pantoprazole (PROTONIX) 40 MG tablet Take 1 tablet (40 mg total) by mouth daily as needed. Caution:prolonged use may cause health problems; appt needed Patient taking differently: Take 40 mg by mouth daily as needed (GERD symptoms). Caution:prolonged use may cause health problems; appt needed 10/13/18  Yes Lada, Satira Anis, MD  Rivaroxaban 15 & 20 MG TBPK Take as directed on package: Start with one 15mg  tablet by mouth  twice a day with food. On Day 22, switch to one 20mg  tablet once a day with food. 03/28/19  Yes Demetrios Loll, MD    Allergies Patient has no known allergies.  Family History  Problem Relation Age of Onset  . Diabetes Mother   . Hypertension Father   . Heart disease Maternal Grandmother   . Heart attack Maternal Grandmother   . Diabetes Maternal Grandfather   . Diabetes Paternal Grandfather     Social History Social History   Tobacco Use  . Smoking status: Former Smoker    Packs/day: 1.00    Years: 20.00    Pack years: 20.00    Types: Cigarettes    Quit date: 09/15/2015    Years since quitting: 3.5  . Smokeless tobacco: Never Used  Substance Use Topics  . Alcohol use: Yes    Alcohol/week: 0.0 standard drinks    Comment: 3-6 cans/beer on weeknights, 6-12 beers on weekends.  . Drug use: No    Review of Systems  Constitutional: No fever/chills Eyes: No visual changes. ENT: No sore throat. Cardiovascular: Denies chest pain. Respiratory: Denies shortness of breath. Gastrointestinal: No abdominal pain.  No nausea, no vomiting.  No diarrhea.  No constipation. Genitourinary: Negative for dysuria. Musculoskeletal: Negative for back pain. Skin: Negative for rash. Neurological: Negative for headaches, focal weakness   ____________________________________________   PHYSICAL EXAM:  VITAL SIGNS: ED Triage Vitals [03/30/19 1901]  Enc Vitals Group     BP (!) 134/93     Pulse Rate (!) 114     Resp 16     Temp 98.7 F (37.1 C)     Temp src      SpO2 96 %     Weight 200 lb (90.7 kg)     Height 5\' 10"  (1.778 m)     Head Circumference      Peak Flow      Pain Score 0     Pain Loc      Pain Edu?      Excl. in [place]?     Constitutional: Alert and oriented. Well appearing and in no acute distress. Eyes: Conjunctivae are normal.  Head: Atraumatic. Nose: No congestion/rhinnorhea. Mouth/Throat: Mucous membranes are moist.  Oropharynx non-erythematous. Neck: No stridor.    Cardiovascular: Normal rate, regular rhythm. Grossly normal heart sounds.  Good peripheral circulation. Respiratory: Normal respiratory effort.  No retractions. Lungs CTAB. Gastrointestinal: Soft and nontender. No distention. No abdominal bruits. No CVA tenderness. Genitourinary: Rectal no hemorrhoids seen or palpated there is bloody mucus in the rectal vault Musculoskeletal: No lower extremity tenderness nor edema.  Neurologic:  Normal speech and language. No gross focal neurologic deficits are appreciated. No gait instability. Skin:  Skin is warm, dry and intact. No rash noted.   ____________________________________________   LABS (all labs ordered are listed, but only abnormal results are displayed)  Labs  Reviewed  COMPREHENSIVE METABOLIC PANEL - Abnormal; Notable for the following components:      Result Value   Glucose, Bld 141 (*)    Calcium 8.4 (*)    Albumin 3.4 (*)    AST 51 (*)    ALT 95 (*)    Total Bilirubin 1.7 (*)    All other components within normal limits  CBC - Abnormal; Notable for the following components:   Hemoglobin 12.4 (*)    RDW 15.9 (*)    All other components within normal limits  SARS CORONAVIRUS 2 (HOSPITAL ORDER, [place] [facility])  POC OCCULT BLOOD, ED  TYPE AND SCREEN   ____________________________________________  EKG   ____________________________________________  RADIOLOGY  ED MD interpretation:    Official radiology report(s): No results found.  ____________________________________________   PROCEDURES  Procedure(s) performed (including Critical Care):  Procedures   ____________________________________________   INITIAL IMPRESSION / ASSESSMENT AND PLAN / ED COURSE  David Carlson was evaluated in Emergency Department on 03/30/2019 for the symptoms described in the history of present illness. He was evaluated in the context of the global COVID-19 pandemic, which necessitated consideration that  the patient might be at risk for infection with the SARS-CoV-2 virus that causes COVID-19. Institutional protocols and algorithms that pertain to the evaluation of patients at risk for COVID-19 are in a state of rapid change based on information released by regulatory bodies including the CDC and federal and state organizations. These policies and algorithms were followed during the patient's care in the ED.     Patient with tachycardia and GI bleeding.  We will get him in the hospital for safety especially since he is on blood thinners and had a recent PE.         ____________________________________________   FINAL CLINICAL IMPRESSION(S) / ED DIAGNOSES  Final diagnoses:  Gastrointestinal hemorrhage, unspecified gastrointestinal hemorrhage type     ED Discharge Orders    None       Note:  This document was prepared using Dragon voice recognition software and may include unintentional dictation errors.    Nena Polio, MD 03/30/19 2126

## 2019-03-30 NOTE — Telephone Encounter (Signed)
I attempted to call the patient back. No answer- I left a detailed message on his voice mail (ok per DPR) with Ignacia Bayley, NP's recommendations.   I advised that he proceed to the ER for evaluation if he is having a large amount of GI bleeding.  I asked if he has any concerns that he may also call back to the office to speak with the provider on call.  I have stated that we cannot advise that he stop Xarelto due to his recent PE's but if he is bleeding from the rectum in large amounts, he will need to go to the ER.   I have advised we will call him back on Monday to follow up.

## 2019-03-30 NOTE — Telephone Encounter (Addendum)
Patient calling to let us know he had an episode of a copious amount of blood in stool after starting xarelto .     C/o bright to darker red rectal bleeding with and without stools  bp 133/88 today   Only symptom is being tired.    Patient aware to call pcp office to notify and to seek emergency treatment if he feels needed.

## 2019-03-30 NOTE — ED Triage Notes (Signed)
Pt states started on xarelto on Wednesday. Pt states has bright red rectal bleeding since this am. Pt with tachycardia noted.

## 2019-03-30 NOTE — ED Notes (Signed)
Mateo Flow, rn notified of pt's arrival in room

## 2019-03-30 NOTE — Telephone Encounter (Signed)
GI bleeding is a serious concern.  In the setting of recent acute PE, I rec that he present to the ED for lab evaluation and likely admission for GI w/u.

## 2019-03-30 NOTE — ED Notes (Signed)
ED TO INPATIENT HANDOFF REPORT  ED Nurse Name and Phone #:  Quillian Quince [place]row[place] Name/Age/Gender David Carlson 48 y.o. male Room/Bed: ED16A/ED16A  Code Status   Code Status: Prior  Home/SNF/Other Home Patient oriented to: self, place, time and situation Is this baseline? Yes   Triage Complete: Triage complete  Chief Complaint gi bleed  Triage Note Pt states started on xarelto on Wednesday. Pt states has bright red rectal bleeding since this am. Pt with tachycardia noted.    Allergies No Known Allergies  Level of Care/Admitting Diagnosis ED Disposition    ED Disposition Condition [facility] Area: [place] [[id number]]  Level of Care: Med-Surg [16]  Covid Evaluation: Asymptomatic Screening Protocol (No Symptoms)  Diagnosis: GI bleed [416606]  Admitting Physician: Hyman Bible DODD [[id number]]  Attending Physician: Hyman Bible DODD [[id number]]  Estimated length of stay: past midnight tomorrow  Certification:: I certify this patient will need inpatient services for at least 2 midnights  PT Class (Do Not Modify): Inpatient [101]  PT Acc Code (Do Not Modify): Private [1]       B Medical/Surgery History Past Medical History:  Diagnosis Date  . Anxiety   . Broken shoulder   . Collar bone fracture    x 2  . Erectile dysfunction   . GERD (gastroesophageal reflux disease)   . H/O Bell's palsy   . Hyperlipidemia   . Hypertension   . Obesity   . Palpitations    a. Previously evaluted when he was incarcerated in [place] - reportedly nl stress test.  . PVC's (premature ventricular contractions)    a. 04/2013 Echo: EF 50-55%, Gr1 DD, mildly dil LA;  b. 04/2017 48hr holter: freq PVCs (5.4%), rare PACs. Single 13 beat atrial run - 143 bpm.   Past Surgical History:  Procedure Laterality Date  . CARPAL TUNNEL RELEASE Bilateral   . HERNIA REPAIR  2015  . HIP SURGERY     a. Age 28 - failure of growth plate to close - Pins inserted  .  LEFT HEART CATH AND CORONARY ANGIOGRAPHY N/A 03/27/2019   Procedure: LEFT HEART CATH AND CORONARY ANGIOGRAPHY;  Surgeon: Nelva Bush, MD;  Location: [facility];  Service: Cardiovascular;  Laterality: N/A;     A IV Location/Drains/Wounds Patient Lines/Drains/Airways Status   Active Line/Drains/Airways    Name:   Placement date:   Placement time:   Site:   Days:   Peripheral IV 03/30/19 Left Forearm   03/30/19    1942    Forearm   less than 1   Peripheral IV 03/30/19 Right Forearm   03/30/19    1948    Forearm   less than 1   Sheath 03/27/19 Right Arterial;Radial   03/27/19    1358    Arterial;Radial   3          Intake/Output Last 24 hours  Intake/Output Summary (Last 24 hours) at 03/30/2019 2248 Last data filed at 03/30/2019 2115 Gross per 24 hour  Intake 999 ml  Output -  Net 999 ml    Labs/Imaging Results for orders placed or performed during the hospital encounter of 03/30/19 (from the past 48 hour(s))  Comprehensive metabolic panel     Status: Abnormal   Collection Time: 03/30/19  7:30 PM  Result Value Ref Range   Sodium 139 135 - 145 mmol/L   Potassium 3.5 3.5 - 5.1 mmol/L   Chloride 105 98 -  111 mmol/L   CO2 24 22 - 32 mmol/L   Glucose, Bld 141 (H) 70 - 99 mg/dL   BUN 13 6 - 20 mg/dL   Creatinine, Ser 0.92 0.61 - 1.24 mg/dL   Calcium 8.4 (L) 8.9 - 10.3 mg/dL   Total Protein 7.2 6.5 - 8.1 g/dL   Albumin 3.4 (L) 3.5 - 5.0 g/dL   AST 51 (H) 15 - 41 U/L   ALT 95 (H) 0 - 44 U/L   Alkaline Phosphatase 96 38 - 126 U/L   Total Bilirubin 1.7 (H) 0.3 - 1.2 mg/dL   GFR calc non Af Amer >60 >60 mL/min   GFR calc Af Amer >60 >60 mL/min   Anion gap 10 5 - 15    Comment: Performed at [facility], [address] [place], [place] [address]  CBC     Status: Abnormal   Collection Time: 03/30/19  7:30 PM  Result Value Ref Range   WBC 5.6 4.0 - 10.5 K/uL   RBC 4.55 4.22 - 5.81 MIL/uL   Hemoglobin 12.4 (L) 13.0 - 17.0 g/dL   HCT 40.2 39.0 - 52.0 %    MCV 88.4 80.0 - 100.0 fL   MCH 27.3 26.0 - 34.0 pg   MCHC 30.8 30.0 - 36.0 g/dL   RDW 15.9 (H) 11.5 - 15.5 %   Platelets 313 150 - 400 K/uL   nRBC 0.0 0.0 - 0.2 %    Comment: Performed at [facility], [address] [place], [place] [address]  Type and screen [place]     Status: None   Collection Time: 03/30/19  7:32 PM  Result Value Ref Range   ABO/RH(D) O POS    Antibody Screen NEG    Sample Expiration      04/02/2019,2359 Performed at [facility], [address], [place], [place] [address]   SARS Coronavirus 2 (CEPHEID - Performed in [place] hospital lab), Hosp Order     Status: None   Collection Time: 03/30/19  8:54 PM   Specimen: Nasopharyngeal Swab  Result Value Ref Range   SARS Coronavirus 2 NEGATIVE NEGATIVE    Comment: (NOTE) If result is NEGATIVE SARS-CoV-2 target nucleic acids are NOT DETECTED. The SARS-CoV-2 RNA is generally detectable in upper and lower  respiratory specimens during the acute phase of infection. The lowest  concentration of SARS-CoV-2 viral copies this assay can detect is 250  copies / mL. A negative result does not preclude SARS-CoV-2 infection  and should not be used as the sole basis for treatment or other  patient management decisions.  A negative result may occur with  improper specimen collection / handling, submission of specimen other  than nasopharyngeal swab, presence of viral mutation(s) within the  areas targeted by this assay, and inadequate number of viral copies  (<250 copies / mL). A negative result must be combined with clinical  observations, patient history, and epidemiological information. If result is POSITIVE SARS-CoV-2 target nucleic acids are DETECTED. The SARS-CoV-2 RNA is generally detectable in upper and lower  respiratory specimens dur ing the acute phase of infection.  Positive  results are indicative of active infection with SARS-CoV-2.  Clinical  correlation with  patient history and other diagnostic information is  necessary to determine patient infection status.  Positive results do  not rule out bacterial infection or co-infection with other viruses. If result is PRESUMPTIVE POSTIVE SARS-CoV-2 nucleic acids MAY BE PRESENT.   A presumptive  positive result was obtained on the submitted specimen  and confirmed on repeat testing.  While 2019 novel coronavirus  (SARS-CoV-2) nucleic acids may be present in the submitted sample  additional confirmatory testing may be necessary for epidemiological  and / or clinical management purposes  to differentiate between  SARS-CoV-2 and other Sarbecovirus currently known to infect humans.  If clinically indicated additional testing with an alternate test  methodology [phone]) is advised. The SARS-CoV-2 RNA is generally  detectable in upper and lower respiratory sp ecimens during the acute  phase of infection. The expected result is Negative. Fact Sheet for Patients:  StrictlyIdeas.no Fact Sheet for Healthcare Providers: BankingDealers.co.za This test is not yet approved or cleared by the [place] FDA and has been authorized for detection and/or diagnosis of SARS-CoV-2 by FDA under an Emergency Use Authorization (EUA).  This EUA will remain in effect (meaning this test can be used) for the duration of the COVID-19 declaration under Section 564(b)(1) of the Act, 21 U.S.C. section 360bbb-3(b)(1), unless the authorization is terminated or revoked sooner. Performed at [facility], [address] [place], [place] [address]    No results found.  Pending Labs [employer] (From admission, onward)    Start     Ordered   Signed and Occupational hygienist morning,   R     Signed and Held   Signed and Held  Hemoglobin and hematocrit, blood  Now then every 6 hours,   R     Signed and Held          Vitals/Pain Today's Vitals    03/30/19 2000 03/30/19 2030 03/30/19 2100 03/30/19 2130  BP: (!) 124/97 (!) 131/93 (!) 133/102 (!) 124/97  Pulse: 93 98 (!) 101 100  Resp: 17 20    Temp:      SpO2: 99% 97% 100% 98%  Weight:      Height:      PainSc: 0-No pain       Isolation Precautions No active isolations  Medications Medications  sodium chloride 0.9 % bolus 1,000 mL (0 mLs Intravenous Stopped 03/30/19 2115)    Mobility walks Low fall risk   Focused Assessments Gastrointestinal   R Recommendations: See Admitting Provider Note  Report given to:   Additional Notes:

## 2019-03-30 NOTE — H&P (Addendum)
[place] at [place] NAME: David Carlson    MR#:  [id number]  DATE OF BIRTH:  1970/11/21  DATE OF ADMISSION:  03/30/2019  PRIMARY CARE PHYSICIAN: Patient, No Pcp Per   REQUESTING/REFERRING PHYSICIAN: Conni Slipper, MD  CHIEF COMPLAINT:   Chief Complaint  Patient presents with  . GI Bleeding    HISTORY OF PRESENT ILLNESS:  David Carlson  is a 48 y.o. male with a known history of recent PE on Xarelto, hypertension, hyperlipidemia, GERD who presented to the ED with bloody stool.  Patient had 2 bloody bowel movements today.  He states that whenever he sits on the toilet, blood pours out of his bottom.  He denies any abdominal pain, nausea, vomiting. He denies any chest pain, palpitations, lightheadedness, dizziness.  He denies any bleeding from other sites.  No NSAID use.  In the ED, he was mildly tachycardic, with heart rates in the low 100s.  Labs were significant for AST 51, ALT 95, total bilirubin 1.7, hemoglobin 12.4.  Hospitalists were called for admission.  PAST MEDICAL HISTORY:   Past Medical History:  Diagnosis Date  . Anxiety   . Broken shoulder   . Collar bone fracture    x 2  . Erectile dysfunction   . GERD (gastroesophageal reflux disease)   . H/O Bell's palsy   . Hyperlipidemia   . Hypertension   . Obesity   . Palpitations    a. Previously evaluted when he was incarcerated in [place] - reportedly nl stress test.  . PVC's (premature ventricular contractions)    a. 04/2013 Echo: EF 50-55%, Gr1 DD, mildly dil LA;  b. 04/2017 48hr holter: freq PVCs (5.4%), rare PACs. Single 13 beat atrial run - 143 bpm.    PAST SURGICAL HISTORY:   Past Surgical History:  Procedure Laterality Date  . CARPAL TUNNEL RELEASE Bilateral   . HERNIA REPAIR  2015  . HIP SURGERY     a. Age 50 - failure of growth plate to close - Pins inserted  . LEFT HEART CATH AND CORONARY ANGIOGRAPHY N/A 03/27/2019   Procedure: LEFT HEART CATH AND CORONARY  ANGIOGRAPHY;  Surgeon: Nelva Bush, MD;  Location: [facility];  Service: Cardiovascular;  Laterality: N/A;    SOCIAL HISTORY:   Social History   Tobacco Use  . Smoking status: Former Smoker    Packs/day: 1.00    Years: 20.00    Pack years: 20.00    Types: Cigarettes    Quit date: 09/15/2015    Years since quitting: 3.5  . Smokeless tobacco: Never Used  Substance Use Topics  . Alcohol use: Yes    Alcohol/week: 0.0 standard drinks    Comment: 3-6 cans/beer on weeknights, 6-12 beers on weekends.    FAMILY HISTORY:   Family History  Problem Relation Age of Onset  . Diabetes Mother   . Hypertension Father   . Heart disease Maternal Grandmother   . Heart attack Maternal Grandmother   . Diabetes Maternal Grandfather   . Diabetes Paternal Grandfather     DRUG ALLERGIES:  No Known Allergies  REVIEW OF SYSTEMS:   Review of Systems  Constitutional: Negative for chills and fever.  HENT: Negative for congestion and sore throat.   Eyes: Negative for blurred vision and double vision.  Respiratory: Negative for cough and shortness of breath.   Cardiovascular: Negative for chest pain and palpitations.  Gastrointestinal: Positive for blood in stool. Negative for abdominal pain,  melena, nausea and vomiting.  Genitourinary: Negative for dysuria, hematuria and urgency.  Musculoskeletal: Positive for joint pain. Negative for back pain and neck pain.  Neurological: Negative for dizziness and headaches.  Psychiatric/Behavioral: Negative for depression. The patient is not nervous/anxious.     MEDICATIONS AT HOME:   Prior to Admission medications   Medication Sig Start Date End Date Taking? Authorizing Provider  albuterol (VENTOLIN HFA) 108 (90 Base) MCG/ACT inhaler Inhale 2 puffs into the lungs every 6 (six) hours as needed for wheezing or shortness of breath. 01/30/19  Yes Hubbard Hartshorn, FNP  atorvastatin (LIPITOR) 20 MG tablet Take 1 tablet (20 mg total) by mouth daily  at 6 PM. 10/13/18  Yes Lada, Satira Anis, MD  carvedilol (COREG) 6.25 MG tablet Take 1 tablet (6.25 mg total) by mouth 2 (two) times daily with a meal. 03/28/19 05/27/19 Yes Demetrios Loll, MD  cyclobenzaprine (FLEXERIL) 10 MG tablet Take 10 mg by mouth 3 (three) times daily as needed. for muscle spams 08/27/18  Yes [provider]  fluticasone (FLONASE) 50 MCG/ACT nasal spray Place 2 sprays into both nostrils daily. 01/30/19  Yes Hubbard Hartshorn, FNP  furosemide (LASIX) 40 MG tablet Take 1 tablet (40 mg total) by mouth daily. 03/29/19  Yes Demetrios Loll, MD  losartan (COZAAR) 25 MG tablet Take 1 tablet (25 mg total) by mouth daily. 03/28/19 03/27/20 Yes Demetrios Loll, MD  Multiple Vitamin (MULTIVITAMIN WITH MINERALS) TABS tablet Take 1 tablet by mouth daily.   Yes [provider]  pantoprazole (PROTONIX) 40 MG tablet Take 1 tablet (40 mg total) by mouth daily as needed. Caution:prolonged use may cause health problems; appt needed Patient taking differently: Take 40 mg by mouth daily as needed (GERD symptoms). Caution:prolonged use may cause health problems; appt needed 10/13/18  Yes Lada, Satira Anis, MD  Rivaroxaban 15 & 20 MG TBPK Take as directed on package: Start with one 15mg  tablet by mouth twice a day with food. On Day 22, switch to one 20mg  tablet once a day with food. 03/28/19  Yes Demetrios Loll, MD      VITAL SIGNS:  Blood pressure (!) 133/102, pulse (!) 101, temperature 98.7 F (37.1 C), resp. rate 20, height 5\' 10"  (1.778 m), weight 90.7 kg, SpO2 100 %.  PHYSICAL EXAMINATION:  Physical Exam  GENERAL:  48 y.o.-year-old patient lying in the bed with no acute distress.  EYES: Pupils equal, round, reactive to light and accommodation. No scleral icterus. Extraocular muscles intact.  HEENT: Head atraumatic, normocephalic. Oropharynx and nasopharynx clear.  NECK:  Supple, no jugular venous distention. No thyroid enlargement, no tenderness.  LUNGS: Normal breath sounds bilaterally, no wheezing,  rales,rhonchi or crepitation. No use of accessory muscles of respiration.  CARDIOVASCULAR: Tachycardic, regular rhythm, S1, S2 normal. No murmurs, rubs, or gallops.  ABDOMEN: Soft, nontender, nondistended. Bowel sounds present. No organomegaly or mass.  EXTREMITIES: No pedal edema, cyanosis, or clubbing. + Tenderness to palpation over the left patellar tendon, no joint effusion of the left knee. NEUROLOGIC: Cranial nerves II through XII are intact. Muscle strength 5/5 in all extremities. Sensation intact. Gait not checked.  PSYCHIATRIC: The patient is alert and oriented x 3.  SKIN: No obvious rash, lesion, or ulcer.   LABORATORY PANEL:   CBC Recent Labs  Lab 03/30/19 1930  WBC 5.6  HGB 12.4*  HCT 40.2  PLT 313   ------------------------------------------------------------------------------------------------------------------  Chemistries  Recent Labs  Lab 03/25/19 0502  03/30/19 1930  NA 137   < >  139  K 4.7   < > 3.5  CL 105   < > 105  CO2 21*   < > 24  GLUCOSE 87   < > 141*  BUN 32*   < > 13  CREATININE 1.58*   < > 0.92  CALCIUM 8.4*   < > 8.4*  MG 1.7  --   --   AST  --    < > 51*  ALT  --    < > 95*  ALKPHOS  --    < > 96  BILITOT  --    < > 1.7*   < > = values in this interval not displayed.   ------------------------------------------------------------------------------------------------------------------  Cardiac Enzymes No results for input(s): TROPONINI in the last 168 hours. ------------------------------------------------------------------------------------------------------------------  RADIOLOGY:  No results found.    IMPRESSION AND PLAN:   GI bleed- in the setting of Xarelto use.  May be due to diverticular bleed. Patient has never had an endoscopy or colonoscopy. Hemoglobin is at baseline. -Clear liquid diet tonight, then n.p.o. at midnight -GI consult -Holding Xarelto -IV PPI -Serial H/H  Recent PE with chronic RLE DVT- recently  hospitalized from 7/10-7/15 with PE -Holding xarelto for now  New diagnosis of systolic CHF- new diagnosis at last hospitalization. ECHO with EF 20-25%. Cardiac cath 7/14 with nonobstructive CAD. Appears euvoluemic on exam. -Continue Coreg, Lasix, losartan  Elevated liver enzymes- improved from last hospitalization. RUQ ultrasound 7/14 showed cholelithiasis and gallbladder wall thickening.  Surgery recommended outpatient follow-up. -Needs to follow-up with Dr. Windell Moment as an outpatient  Hyperlipidemia- stable -Continue home lipitor  All the records are reviewed and case discussed with ED provider. Management plans discussed with the patient, family and they are in agreement.  CODE STATUS: Full  TOTAL TIME TAKING CARE OF THIS PATIENT: 45 minutes.    Berna Spare Mayo M.D on 03/30/2019 at 9:39 PM  Between 7am to 6pm - Pager - [phone]  After 6pm go to [email or web address] - Proofreader  [facility]  Office  [phone]  CC: Primary care physician; Patient, No Pcp Per   Note: This dictation was prepared with Dragon dictation along with smaller phrase technology. Any transcriptional errors that result from this process are unintentional.

## 2019-03-30 NOTE — Telephone Encounter (Signed)
Patient currently on Xarelto- PE protocol per discharge note on 03/28/19.  To Ignacia Bayley, NP to review.

## 2019-03-31 ENCOUNTER — Inpatient Hospital Stay: Payer: Managed Care, Other (non HMO)

## 2019-03-31 DIAGNOSIS — K921 Melena: Principal | ICD-10-CM

## 2019-03-31 DIAGNOSIS — R748 Abnormal levels of other serum enzymes: Secondary | ICD-10-CM

## 2019-03-31 LAB — BASIC METABOLIC PANEL
Anion gap: 9 (ref 5–15)
BUN: 14 mg/dL (ref 6–20)
CO2: 24 mmol/L (ref 22–32)
Calcium: 8.1 mg/dL — ABNORMAL LOW (ref 8.9–10.3)
Chloride: 106 mmol/L (ref 98–111)
Creatinine, Ser: 0.96 mg/dL (ref 0.61–1.24)
GFR calc Af Amer: >60 mL/min (ref >60)
GFR calc non Af Amer: >60 mL/min (ref >60)
Glucose, Bld: 94 mg/dL (ref 70–99)
Potassium: 4.3 mmol/L (ref 3.5–5.1)
Sodium: 139 mmol/L (ref 135–145)

## 2019-03-31 LAB — HEMOGLOBIN AND HEMATOCRIT, BLOOD
HCT: 38 % — ABNORMAL LOW (ref 39.0–52.0)
HCT: 39.5 % (ref 39.0–52.0)
HCT: 41.2 % (ref 39.0–52.0)
Hemoglobin: 11.7 g/dL — ABNORMAL LOW (ref 13.0–17.0)
Hemoglobin: 12.3 g/dL — ABNORMAL LOW (ref 13.0–17.0)
Hemoglobin: 12.5 g/dL — ABNORMAL LOW (ref 13.0–17.0)

## 2019-03-31 MED ORDER — TRIAMCINOLONE ACETONIDE 40 MG/ML IJ SUSP
40.0000 mg | Freq: Once | INTRAMUSCULAR | Status: AC
  Administered 2019-03-31: 40 mg via INTRA_ARTICULAR
  Filled 2019-03-31: qty 1

## 2019-03-31 MED ORDER — BUPIVACAINE HCL (PF) 0.5 % IJ SOLN
10.0000 mL | Freq: Once | INTRAMUSCULAR | Status: AC
  Administered 2019-03-31: 10 mL
  Filled 2019-03-31: qty 10

## 2019-03-31 NOTE — Plan of Care (Signed)
Patient doing well.  Hgb improved today.  Patient had a BM which did not have hardly any blood in it.  GI MD still planning to see the patient but she did start him on clear liquid diet.  Orthopedic MD to give cortisone injection for the left knee pain.  No significant changes.  Patient did not want me to update anyone.

## 2019-03-31 NOTE — Consult Note (Signed)
Reason for consult is left knee swelling and pain.  History: The patient is a 48 year old who is working full-time he is admitted with a GI bleed with history of being on Xarelto for a pulmonary embolus.  He noted onset of knee pain spontaneously with intermittent swelling about 3 weeks ago with no history of injury.  He denies prior knee problems.  He has not had locking or catching swelling and pain have been present.  On examination he has a mild effusion present with some palpable synovitis.  He has negative instability to Lockman posterior drawer or varus valgus stress and negative medial lateral McMurray. Area that is slightly tender is around the medial lateral border of the patella and he feels like that the pain is behind the patella.  Radiographs show no evidence of abnormality.  Except for some effusion  Impression is synovitis for unknown reason left knee  Recommendation is for corticosteroid injection which was performed after informed consent was obtained.  If this does not give relief he is to follow-up with me in 2 to 3 weeks.

## 2019-03-31 NOTE — Consult Note (Signed)
Vonda Antigua, MD [address] [place], [place], [place], [id number], [place], [place], [place], [address] Phone: [phone]  Fax: [phone]  Consultation  Referring Provider:     Dr. Molli Hazard  Primary Care Physician:  Patient, No Pcp Per Reason for Consultation:     GI bleed  Date of Admission:  03/30/2019 Date of Consultation:  03/31/2019         HPI:   David Carlson is a 48 y.o. male with recent bilateral PE in July 2020, new onset CHF with EF of 20 to 25%, nonobstructive CAD, on Xarelto presents with hematochezia.  Patient reports 2-3 bowel movements with bright red blood.  Most were blood in the toilet paper only, with only 1 bowel movement with blood within the stool.  Patient has had 1 or 2 bowel movements today and they both have been brown.  No abdominal pain.  No emesis.  No melena.  No prior EGD or colonoscopy.  Last Xarelto dose was yesterday evening.  Patient's lowest hemoglobin throughout his admission has been 11.7.    Past Medical History:  Diagnosis Date  . Anxiety   . Broken shoulder   . Collar bone fracture    x 2  . Erectile dysfunction   . GERD (gastroesophageal reflux disease)   . H/O Bell's palsy   . Hyperlipidemia   . Hypertension   . Obesity   . Palpitations    a. Previously evaluted when he was incarcerated in [place] - reportedly nl stress test.  . PVC's (premature ventricular contractions)    a. 04/2013 Echo: EF 50-55%, Gr1 DD, mildly dil LA;  b. 04/2017 48hr holter: freq PVCs (5.4%), rare PACs. Single 13 beat atrial run - 143 bpm.    Past Surgical History:  Procedure Laterality Date  . CARPAL TUNNEL RELEASE Bilateral   . HERNIA REPAIR  2015  . HIP SURGERY     a. Age 61 - failure of growth plate to close - Pins inserted  . LEFT HEART CATH AND CORONARY ANGIOGRAPHY N/A 03/27/2019   Procedure: LEFT HEART CATH AND CORONARY ANGIOGRAPHY;  Surgeon: Nelva Bush, MD;  Location: [facility];  Service:  Cardiovascular;  Laterality: N/A;    Prior to Admission medications   Medication Sig Start Date End Date Taking? Authorizing Provider  albuterol (VENTOLIN HFA) 108 (90 Base) MCG/ACT inhaler Inhale 2 puffs into the lungs every 6 (six) hours as needed for wheezing or shortness of breath. 01/30/19  Yes Hubbard Hartshorn, FNP  atorvastatin (LIPITOR) 20 MG tablet Take 1 tablet (20 mg total) by mouth daily at 6 PM. 10/13/18  Yes Lada, Satira Anis, MD  carvedilol (COREG) 6.25 MG tablet Take 1 tablet (6.25 mg total) by mouth 2 (two) times daily with a meal. 03/28/19 05/27/19 Yes Demetrios Loll, MD  cyclobenzaprine (FLEXERIL) 10 MG tablet Take 10 mg by mouth 3 (three) times daily as needed. for muscle spams 08/27/18  Yes [provider]  fluticasone (FLONASE) 50 MCG/ACT nasal spray Place 2 sprays into both nostrils daily. 01/30/19  Yes Hubbard Hartshorn, FNP  furosemide (LASIX) 40 MG tablet Take 1 tablet (40 mg total) by mouth daily. 03/29/19  Yes Demetrios Loll, MD  losartan (COZAAR) 25 MG tablet Take 1 tablet (25 mg total) by mouth daily. 03/28/19 03/27/20 Yes Demetrios Loll, MD  Multiple Vitamin (MULTIVITAMIN WITH MINERALS) TABS tablet Take 1 tablet by mouth daily.   Yes [provider]  pantoprazole (PROTONIX)  40 MG tablet Take 1 tablet (40 mg total) by mouth daily as needed. Caution:prolonged use may cause health problems; appt needed Patient taking differently: Take 40 mg by mouth daily as needed (GERD symptoms). Caution:prolonged use may cause health problems; appt needed 10/13/18  Yes Lada, Satira Anis, MD  Rivaroxaban 15 & 20 MG TBPK Take as directed on package: Start with one 15mg  tablet by mouth twice a day with food. On Day 22, switch to one 20mg  tablet once a day with food. 03/28/19  Yes Demetrios Loll, MD    Family History  Problem Relation Age of Onset  . Diabetes Mother   . Hypertension Father   . Heart disease Maternal Grandmother   . Heart attack Maternal Grandmother   . Diabetes Maternal Grandfather    . Diabetes Paternal Grandfather      Social History   Tobacco Use  . Smoking status: Former Smoker    Packs/day: 1.00    Years: 20.00    Pack years: 20.00    Types: Cigarettes    Quit date: 09/15/2015    Years since quitting: 3.5  . Smokeless tobacco: Never Used  Substance Use Topics  . Alcohol use: Yes    Alcohol/week: 0.0 standard drinks    Comment: 3-6 cans/beer on weeknights, 6-12 beers on weekends.  . Drug use: No    Allergies as of 03/30/2019  . (No Known Allergies)    Review of Systems:    All systems reviewed and negative except where noted in HPI.   Physical Exam:  Vital signs in last 24 hours: Vitals:   03/30/19 2300 03/30/19 2326 03/31/19 1007 03/31/19 1225  BP: (!) 124/99 (!) 135/97 (!) 124/97 (!) 118/95  Pulse: (!) 105 (!) 110 (!) 102 98  Resp: 17 18  16   Temp:  97.9 F (36.6 C)  98.5 F (36.9 C)  TempSrc:  Oral  Oral  SpO2: 97% 100%  94%  Weight:  89.3 kg    Height:  5\' 10"  (1.778 m)     Last BM Date: 03/30/19 General:   Pleasant, cooperative in NAD Head:  Normocephalic and atraumatic. Eyes:   No icterus.   Conjunctiva pink. PERRLA. Ears:  Normal auditory acuity. Neck:  Supple; no masses or thyroidomegaly Lungs: Respirations even and unlabored. Lungs clear to auscultation bilaterally.   No wheezes, crackles, or rhonchi.  Abdomen:  Soft, nondistended, nontender. Normal bowel sounds. No appreciable masses or hepatomegaly.  No rebound or guarding.  Rectal: No external hemorrhoids, no perianal lesions, DRE with no stool or masses in rectal vault, no blood on DRE Neurologic:  Alert and oriented x3;  grossly normal neurologically. Skin:  Intact without significant lesions or rashes. Cervical Nodes:  No significant cervical adenopathy. Psych:  Alert and cooperative. Normal affect.  LAB RESULTS: Recent Labs    03/30/19 1930 03/30/19 2350 03/31/19 0420 03/31/19 1137  WBC 5.6  --   --   --   HGB 12.4* 12.5* 11.7* 12.3*  HCT 40.2 41.2 38.0* 39.5   PLT 313  --   --   --    BMET Recent Labs    03/30/19 1930 03/31/19 0420  NA 139 139  K 3.5 4.3  CL 105 106  CO2 24 24  GLUCOSE 141* 94  BUN 13 14  CREATININE 0.92 0.96  CALCIUM 8.4* 8.1*   LFT Recent Labs    03/30/19 1930  PROT 7.2  ALBUMIN 3.4*  AST 51*  ALT 95*  ALKPHOS 96  BILITOT 1.7*   PT/INR No results for input(s): LABPROT, INR in the last 72 hours.  STUDIES: Dg Knee 1-2 Views Left  Result Date: 03/31/2019 CLINICAL DATA:  LEFT knee pain starting yesterday.  No known injury. EXAM: LEFT KNEE - 1-2 VIEW COMPARISON:  None. FINDINGS: No evidence of fracture, dislocation, or joint effusion. No evidence of arthropathy or other focal bone abnormality. Soft tissues are unremarkable. IMPRESSION: Normal plain film of the LEFT knee. Electronically Signed   By: Franki Cabot M.D.   On: 03/31/2019 11:53      Impression / Plan:   David Carlson is a 48 y.o. y/o male with hematochezia on admission, which is now resolved, on Xarelto for bilateral PE diagnosed in July 2020, with new onset CHF and EF of 20 to 25%  Patient's hematochezia is likely due to underlying internal hemorrhoids as hematochezia quickly resolved and hemoglobin remained close to normal  However, patient has never had a colonoscopy and underlying malignancy or large polyps cannot be ruled out until the colonoscopy is done  Risk and benefit of procedures were discussed in detail with the patient, along with consideration for his comorbidities.  Patient would like to hold off on any procedures at this time but would reconsider if he starts bleeding again  Given his recent CHF, and recent bilateral PE within this month, patient will be high risk for endoscopic procedures.  If he starts having rebleeding, we may need to consider RBC scan or CTA depending on rate of bleeding.  Xarelto would need to be held for a few days prior to any endoscopy as well.  And last dose was yesterday evening  Given  patient's recent PE, and since his bleeding has resolved, would recommend primary team to weigh risks and benefits of anticoagulation, have an extensive discussion with patient and consider challenging him with anticoagulation while as an inpatient, prior to discharge to see if he rebleeds.  If he rebleeds, he may need RBC scan, CTA, or endoscopy.  No signs of upper GI bleeding  Continue serial CBCs and transfuse PRN  Patient has had elevated liver enzymes since his previous admission last week  This is likely due to liver congestion from CHF  Liver enzymes have overall improved and transaminases are almost back to normal   Bilirubin has also improved and fractionation previously showed elevation in indirect bilirubin and therefore we do not suspect this to be from biliary obstruction.  In addition MRI did not show any evidence of biliary obstruction.  Would expect liver enzymes to continue to improve   If they do not, can obtain further work-up as appropriate  Thank you for involving me in the care of this patient.      LOS: 1 day   Virgel Manifold, MD  03/31/2019, 4:03 PM

## 2019-03-31 NOTE — Op Note (Signed)
*   No surgery found *  5:01 PM  PATIENT:  David Carlson  48 y.o. male  PRE-OPERATIVE DIAGNOSIS: Left knee synovitis  POST-OPERATIVE DIAGNOSIS: Left knee synovitis  PROCEDURE: Left knee corticosteroid injection  SURGEON: Laurene Footman, MD  ANESTHESIA:   local  EBL:  No intake/output data recorded.  BLOOD ADMINISTERED:none  DRAINS: none   LOCAL MEDICATIONS USED:  MARCAINE     SPECIMEN:  No Specimen  DISPOSITION OF SPECIMEN:  N/A  COUNTS:  NO No count required for injection  DICTATION: .Dragon Dictation patient had been seen earlier in the day and after medication was available and informed consent had been obtained appropriate patient identification and timeout procedures carried out.  With the knee bent to 90 degrees the anterior lateral portal was prepped with Betadine and allowed to dry.  A 21-gauge needle was then inserted inferior laterally into the joint and 40 mg Kenalog 3 cc half percent Marcaine plain were injected into the joint and tolerated well.  A 4 x 4 was applied followed by an Ace wrap with being on Xarelto to try to minimize swelling and bleeding from the injection.  Patient tolerated procedure well  PLAN OF CARE: Continue as inpatient  PATIENT DISPOSITION:  Procedure performed in patient room

## 2019-03-31 NOTE — Progress Notes (Signed)
[facility] [place] NAME: David Carlson    MR#:  [id number]  DATE OF BIRTH:  1971/02/09  SUBJECTIVE:  CHIEF COMPLAINT:   Chief Complaint  Patient presents with  . GI Bleeding   He was recently started on Xarelto for PE.  Also noted to have very low EF in last admission and cardiac cath was done.  For last 2 days he was having blood in the stool without abdominal pain or hematemesis Denies over-the-counter pain medication use or using BC powders or aspirin. Overnight he did not had any big bowel movement but he sees some blood while wiping.  REVIEW OF SYSTEMS:  CONSTITUTIONAL: No fever, fatigue or weakness.  EYES: No blurred or double vision.  EARS, NOSE, AND THROAT: No tinnitus or ear pain.  RESPIRATORY: No cough, shortness of breath, wheezing or hemoptysis.  CARDIOVASCULAR: No chest pain, orthopnea, edema.  GASTROINTESTINAL: No nausea, vomiting, diarrhea or abdominal pain.  GENITOURINARY: No dysuria, hematuria.  ENDOCRINE: No polyuria, nocturia,  HEMATOLOGY: No anemia, easy bruising or bleeding SKIN: No rash or lesion. MUSCULOSKELETAL: No joint pain or arthritis.   NEUROLOGIC: No tingling, numbness, weakness.  PSYCHIATRY: No anxiety or depression.   ROS  DRUG ALLERGIES:  No Known Allergies  VITALS:  Blood pressure (!) 118/95, pulse 98, temperature 98.5 F (36.9 C), temperature source Oral, resp. rate 16, height 5\' 10"  (1.778 m), weight 89.3 kg, SpO2 94 %.  PHYSICAL EXAMINATION:  Physical Exam   GENERAL:  48 y.o.-year-old patient lying in the bed with no acute distress.  EYES: Pupils equal, round, reactive to light and accommodation. No scleral icterus. Extraocular muscles intact.  HEENT: Head atraumatic, normocephalic. Oropharynx and nasopharynx clear.  NECK:  Supple, no jugular venous distention. No thyroid enlargement, no tenderness.  LUNGS: Normal breath sounds bilaterally, no wheezing, rales,rhonchi or crepitation. No  use of accessory muscles of respiration.  CARDIOVASCULAR: Tachycardic, regular rhythm, S1, S2 normal. No murmurs, rubs, or gallops.  ABDOMEN: Soft, nontender, nondistended. Bowel sounds present. No organomegaly or mass.  EXTREMITIES: No pedal edema, cyanosis, or clubbing. + Tenderness to palpation over the left patellar tendon, no joint effusion of the left knee. NEUROLOGIC: Cranial nerves II through XII are intact. Muscle strength 5/5 in all extremities. Sensation intact. Gait not checked.  PSYCHIATRIC: The patient is alert and oriented x 3.  SKIN: No obvious rash, lesion, or ulcer. LABORATORY PANEL:   CBC Recent Labs  Lab 03/30/19 1930  03/31/19 1137  WBC 5.6  --   --   HGB 12.4*   < > 12.3*  HCT 40.2   < > 39.5  PLT 313  --   --    < > = values in this interval not displayed.   ------------------------------------------------------------------------------------------------------------------  Chemistries  Recent Labs  Lab 03/25/19 0502  03/30/19 1930 03/31/19 0420  NA 137   < > 139 139  K 4.7   < > 3.5 4.3  CL 105   < > 105 106  CO2 21*   < > 24 24  GLUCOSE 87   < > 141* 94  BUN 32*   < > 13 14  CREATININE 1.58*   < > 0.92 0.96  CALCIUM 8.4*   < > 8.4* 8.1*  MG 1.7  --   --   --   AST  --    < > 51*  --   ALT  --    < > 95*  --  ALKPHOS  --    < > 96  --   BILITOT  --    < > 1.7*  --    < > = values in this interval not displayed.   ------------------------------------------------------------------------------------------------------------------  Cardiac Enzymes No results for input(s): TROPONINI in the last 168 hours. ------------------------------------------------------------------------------------------------------------------  RADIOLOGY:  Dg Knee 1-2 Views Left  Result Date: 03/31/2019 CLINICAL DATA:  LEFT knee pain starting yesterday.  No known injury. EXAM: LEFT KNEE - 1-2 VIEW COMPARISON:  None. FINDINGS: No evidence of fracture, dislocation, or joint  effusion. No evidence of arthropathy or other focal bone abnormality. Soft tissues are unremarkable. IMPRESSION: Normal plain film of the LEFT knee. Electronically Signed   By: Franki Cabot M.D.   On: 03/31/2019 11:53    ASSESSMENT AND PLAN:   Active Problems:   GI bleed  GI bleed- in the setting of Xarelto use.  May be due to diverticular bleed. Patient has never had an endoscopy or colonoscopy. Hemoglobin is at baseline. -GI consult -Holding Xarelto (last dose 03/30/19 evening) -IV PPI -Serial H/H- stable  Recent PE with chronic RLE DVT- recently hospitalized from 7/10-7/15 with PE -Holding xarelto for now  New diagnosis of systolic CHF- new diagnosis at last hospitalization. ECHO with EF 20-25%. Cardiac cath 7/14 with nonobstructive CAD. Appears euvoluemic on exam. -Continue Coreg, Lasix, losartan  Elevated liver enzymes- improved from last hospitalization. RUQ ultrasound 7/14 showed cholelithiasis and gallbladder wall thickening.  Surgery recommended outpatient follow-up. -Needs to follow-up with Dr. Windell Moment as an outpatient  Hyperlipidemia- stable -Continue home lipitor     All the records are reviewed and case discussed with Care Management/Social Workerr. Management plans discussed with the patient, family and they are in agreement.  CODE STATUS: Full.  TOTAL TIME TAKING CARE OF THIS PATIENT: 35 minutes.     POSSIBLE D/C IN 1-2 DAYS, DEPENDING ON CLINICAL CONDITION.   [name] M.D on 03/31/2019   Between 7am to 6pm - Pager - [phone]  After 6pm go to [email or web address] - password [email or web address] [facility]  Office  [phone]  CC: Primary care physician; Patient, No Pcp Per  Note: This dictation was prepared with Dragon dictation along with smaller phrase technology. Any transcriptional errors that result from this process are unintentional.

## 2019-04-01 MED ORDER — RIVAROXABAN 20 MG PO TABS
20.0000 mg | ORAL_TABLET | Freq: Every day | ORAL | Status: DC
  Administered 2019-04-02 – 2019-04-03 (×2): 20 mg via ORAL
  Filled 2019-04-01 (×2): qty 1

## 2019-04-01 MED ORDER — RIVAROXABAN 15 MG PO TABS
15.0000 mg | ORAL_TABLET | Freq: Two times a day (BID) | ORAL | Status: DC
  Administered 2019-04-01: 15 mg via ORAL
  Filled 2019-04-01 (×2): qty 1

## 2019-04-01 NOTE — Progress Notes (Signed)
[facility] for Xarelto Indication: pulmonary embolus  No Known Allergies  Patient Measurements: Height: 5\' 10"  (177.8 cm) Weight: 196 lb 13.9 oz (89.3 kg) IBW/kg (Calculated) : 73  Vital Signs: Temp: 98.2 F (36.8 C) (07/19 0403) Temp Source: Oral (07/19 0403) BP: 112/88 (07/19 0403) Pulse Rate: 93 (07/19 0403)  Labs: Recent Labs    03/30/19 1930 03/30/19 2350 03/31/19 0420 03/31/19 1137  HGB 12.4* 12.5* 11.7* 12.3*  HCT 40.2 41.2 38.0* 39.5  PLT 313  --   --   --   CREATININE 0.92  --  0.96  --     Estimated Creatinine Clearance: 107 mL/min (by C-G formula based on SCr of 0.96 mg/dL).   Medical History: Past Medical History:  Diagnosis Date  . Anxiety   . Broken shoulder   . Collar bone fracture    x 2  . Erectile dysfunction   . GERD (gastroesophageal reflux disease)   . H/O Bell's palsy   . Hyperlipidemia   . Hypertension   . Obesity   . Palpitations    a. Previously evaluted when he was incarcerated in [place] - reportedly nl stress test.  . PVC's (premature ventricular contractions)    a. 04/2013 Echo: EF 50-55%, Gr1 DD, mildly dil LA;  b. 04/2017 48hr holter: freq PVCs (5.4%), rare PACs. Single 13 beat atrial run - 143 bpm.     Assessment: 48 year old male admitted with concern for GI bleed. Per GI, most likely s/t internal hemorrhoids. Patient just recently started on Xarelto for PE. Pharmacy consulted to restart Xarelto.  Goal of Therapy:  Monitor platelets by anticoagulation protocol: Yes   Plan:  Spoke with pt who states that he recently started taking the Xarelto and is still in the initial dosing phase of 15 mg BID. Will resume Xarelto 15 mg BID. Will follow CBC per protocol.  Tawnya Crook, PharmD Clinical Pharmacist 04/01/2019,9:16 AM

## 2019-04-01 NOTE — Progress Notes (Signed)
David Antigua, MD [address], [place], [place], [place], [address] [address], [place], [place], [place], [address] Phone: [phone]  Fax: [phone]   Subjective: Patient was restarted on his anticoagulation today.  He reports he still having brown stool, with very minute amount of blood streaking in 1 of them.   Objective: Exam: Vital signs in last 24 hours: Vitals:   04/01/19 0403 04/01/19 0935 04/01/19 1218 04/01/19 1250  BP: 112/88 115/88 (!) 115/99 110/86  Pulse: 93 97 (!) 101   Resp: 18  18   Temp: 98.2 F (36.8 C)  98.5 F (36.9 C)   TempSrc: Oral  Oral   SpO2: 99%  98%   Weight:      Height:       Weight change:   Intake/Output Summary (Last 24 hours) at 04/01/2019 1545 Last data filed at 04/01/2019 1009 Gross per 24 hour  Intake 240 ml  Output -  Net 240 ml    General: No acute distress, AAO x3 Abd: Soft, NT/ND, No HSM Skin: Warm, no rashes Neck: Supple, Trachea midline   Lab Results: Lab Results  Component Value Date   WBC 5.6 03/30/2019   HGB 12.3 (L) 03/31/2019   HCT 39.5 03/31/2019   MCV 88.4 03/30/2019   PLT 313 03/30/2019   Micro Results: Recent Results (from the past 240 hour(s))  SARS Coronavirus 2 (CEPHEID - Performed in [facility] hospital lab), Hosp Order     Status: None   Collection Time: 03/23/19 12:49 PM   Specimen: Nasopharyngeal Swab  Result Value Ref Range Status   SARS Coronavirus 2 NEGATIVE NEGATIVE Final    Comment: (NOTE) If result is NEGATIVE SARS-CoV-2 target nucleic acids are NOT DETECTED. The SARS-CoV-2 RNA is generally detectable in upper and lower  respiratory specimens during the acute phase of infection. The lowest  concentration of SARS-CoV-2 viral copies this assay can detect is 250  copies / mL. A negative result does not preclude SARS-CoV-2 infection  and should not be used as the sole basis for treatment or other  patient management decisions.  A negative result may occur with  improper  specimen collection / handling, submission of specimen other  than nasopharyngeal swab, presence of viral mutation(s) within the  areas targeted by this assay, and inadequate number of viral copies  (<250 copies / mL). A negative result must be combined with clinical  observations, patient history, and epidemiological information. If result is POSITIVE SARS-CoV-2 target nucleic acids are DETECTED. The SARS-CoV-2 RNA is generally detectable in upper and lower  respiratory specimens dur ing the acute phase of infection.  Positive  results are indicative of active infection with SARS-CoV-2.  Clinical  correlation with patient history and other diagnostic information is  necessary to determine patient infection status.  Positive results do  not rule out bacterial infection or co-infection with other viruses. If result is PRESUMPTIVE POSTIVE SARS-CoV-2 nucleic acids MAY BE PRESENT.   A presumptive positive result was obtained on the submitted specimen  and confirmed on repeat testing.  While 2019 novel coronavirus  (SARS-CoV-2) nucleic acids may be present in the submitted sample  additional confirmatory testing may be necessary for epidemiological  and / or clinical management purposes  to differentiate between  SARS-CoV-2 and other Sarbecovirus currently known to infect humans.  If clinically indicated additional testing with an alternate test  methodology [phone]) is advised. The SARS-CoV-2 RNA is generally  detectable in upper and lower respiratory sp ecimens  during the acute  phase of infection. The expected result is Negative. Fact Sheet for Patients:  StrictlyIdeas.no Fact Sheet for Healthcare Providers: BankingDealers.co.za This test is not yet approved or cleared by the [place] FDA and has been authorized for detection and/or diagnosis of SARS-CoV-2 by FDA under an Emergency Use Authorization (EUA).  This EUA will remain in  effect (meaning this test can be used) for the duration of the COVID-19 declaration under Section 564(b)(1) of the Act, 21 U.S.C. section 360bbb-3(b)(1), unless the authorization is terminated or revoked sooner. Performed at [facility], [address], [place], [place] [address]   SARS Coronavirus 2 (CEPHEID - Performed in [facility] hospital lab), Hosp Order     Status: None   Collection Time: 03/30/19  8:54 PM   Specimen: Nasopharyngeal Swab  Result Value Ref Range Status   SARS Coronavirus 2 NEGATIVE NEGATIVE Final    Comment: (NOTE) If result is NEGATIVE SARS-CoV-2 target nucleic acids are NOT DETECTED. The SARS-CoV-2 RNA is generally detectable in upper and lower  respiratory specimens during the acute phase of infection. The lowest  concentration of SARS-CoV-2 viral copies this assay can detect is 250  copies / mL. A negative result does not preclude SARS-CoV-2 infection  and should not be used as the sole basis for treatment or other  patient management decisions.  A negative result may occur with  improper specimen collection / handling, submission of specimen other  than nasopharyngeal swab, presence of viral mutation(s) within the  areas targeted by this assay, and inadequate number of viral copies  (<250 copies / mL). A negative result must be combined with clinical  observations, patient history, and epidemiological information. If result is POSITIVE SARS-CoV-2 target nucleic acids are DETECTED. The SARS-CoV-2 RNA is generally detectable in upper and lower  respiratory specimens dur ing the acute phase of infection.  Positive  results are indicative of active infection with SARS-CoV-2.  Clinical  correlation with patient history and other diagnostic information is  necessary to determine patient infection status.  Positive results do  not rule out bacterial infection or co-infection with other viruses. If result is PRESUMPTIVE POSTIVE SARS-CoV-2 nucleic  acids MAY BE PRESENT.   A presumptive positive result was obtained on the submitted specimen  and confirmed on repeat testing.  While 2019 novel coronavirus  (SARS-CoV-2) nucleic acids may be present in the submitted sample  additional confirmatory testing may be necessary for epidemiological  and / or clinical management purposes  to differentiate between  SARS-CoV-2 and other Sarbecovirus currently known to infect humans.  If clinically indicated additional testing with an alternate test  methodology [phone]) is advised. The SARS-CoV-2 RNA is generally  detectable in upper and lower respiratory sp ecimens during the acute  phase of infection. The expected result is Negative. Fact Sheet for Patients:  StrictlyIdeas.no Fact Sheet for Healthcare Providers: BankingDealers.co.za This test is not yet approved or cleared by the [place] FDA and has been authorized for detection and/or diagnosis of SARS-CoV-2 by FDA under an Emergency Use Authorization (EUA).  This EUA will remain in effect (meaning this test can be used) for the duration of the COVID-19 declaration under Section 564(b)(1) of the Act, 21 U.S.C. section 360bbb-3(b)(1), unless the authorization is terminated or revoked sooner. Performed at [facility], [address] [place], [place] [address]    Studies/Results: Dg Knee 1-2 Views Left  Result Date: 03/31/2019 CLINICAL DATA:  LEFT knee pain starting yesterday.  No  known injury. EXAM: LEFT KNEE - 1-2 VIEW COMPARISON:  None. FINDINGS: No evidence of fracture, dislocation, or joint effusion. No evidence of arthropathy or other focal bone abnormality. Soft tissues are unremarkable. IMPRESSION: Normal plain film of the LEFT knee. Electronically Signed   By: Franki Cabot M.D.   On: 03/31/2019 11:53   Medications:  Scheduled Meds: . atorvastatin  20 mg Oral q1800  . carvedilol  6.25 mg Oral BID WC  . fluticasone  2  spray Each Nare Daily  . furosemide  40 mg Oral Daily  . losartan  25 mg Oral Daily  . pantoprazole (PROTONIX) IV  40 mg Intravenous Q12H  . [START ON 04/02/2019] rivaroxaban  20 mg Oral Daily   Continuous Infusions: PRN Meds:.acetaminophen **OR** acetaminophen, albuterol, cyclobenzaprine, ondansetron **OR** ondansetron (ZOFRAN) IV, polyethylene glycol   Assessment: Hematochezia  Plan: Monitor hemoglobin at least once daily  If it is not dropping, the minute streaks in stool may be due to underlying internal hemorrhoids  Monitor closely on anticoagulation and if bleeding gets worse or if patient becomes acutely anemic, would recommend RBC scan given his recent bilateral PE and new onset CHF and thus high risk for endoscopic procedures  Dr. Vicente Males to follow from tomorrow   LOS: 2 days   David Antigua, MD 04/01/2019, 3:45 PM

## 2019-04-01 NOTE — Progress Notes (Signed)
[facility] [place] NAME: David Carlson    MR#:  [id number]  DATE OF BIRTH:  1971/04/11  SUBJECTIVE:  CHIEF COMPLAINT:   Chief Complaint  Patient presents with  . GI Bleeding   He was recently started on Xarelto for PE.  Also noted to have very low EF in last admission and cardiac cath was done.  For last 2 days he was having blood in the stool without abdominal pain or hematemesis Denies over-the-counter pain medication use or using BC powders or aspirin. Overnight he did not had any big bowel movement but he sees some blood while wiping.  REVIEW OF SYSTEMS:  CONSTITUTIONAL: No fever, fatigue or weakness.  EYES: No blurred or double vision.  EARS, NOSE, AND THROAT: No tinnitus or ear pain.  RESPIRATORY: No cough, shortness of breath, wheezing or hemoptysis.  CARDIOVASCULAR: No chest pain, orthopnea, edema.  GASTROINTESTINAL: No nausea, vomiting, diarrhea or abdominal pain.  GENITOURINARY: No dysuria, hematuria.  ENDOCRINE: No polyuria, nocturia,  HEMATOLOGY: No anemia, easy bruising or bleeding SKIN: No rash or lesion. MUSCULOSKELETAL: No joint pain or arthritis.   NEUROLOGIC: No tingling, numbness, weakness.  PSYCHIATRY: No anxiety or depression.   ROS  DRUG ALLERGIES:  No Known Allergies  VITALS:  Blood pressure 110/86, pulse (!) 101, temperature 98.5 F (36.9 C), temperature source Oral, resp. rate 18, height 5\' 10"  (1.778 m), weight 89.3 kg, SpO2 98 %.  PHYSICAL EXAMINATION:  Physical Exam   GENERAL:  48 y.o.-year-old patient lying in the bed with no acute distress.  EYES: Pupils equal, round, reactive to light and accommodation. No scleral icterus. Extraocular muscles intact.  HEENT: Head atraumatic, normocephalic. Oropharynx and nasopharynx clear.  NECK:  Supple, no jugular venous distention. No thyroid enlargement, no tenderness.  LUNGS: Normal breath sounds bilaterally, no wheezing, rales,rhonchi or crepitation. No  use of accessory muscles of respiration.  CARDIOVASCULAR: Tachycardic, regular rhythm, S1, S2 normal. No murmurs, rubs, or gallops.  ABDOMEN: Soft, nontender, nondistended. Bowel sounds present. No organomegaly or mass.  EXTREMITIES: No pedal edema, cyanosis, or clubbing.  NEUROLOGIC: Cranial nerves II through XII are intact. Muscle strength 5/5 in all extremities. Sensation intact. Gait not checked.  PSYCHIATRIC: The patient is alert and oriented x 3.  SKIN: No obvious rash, lesion, or ulcer. LABORATORY PANEL:   CBC Recent Labs  Lab 03/30/19 1930  03/31/19 1137  WBC 5.6  --   --   HGB 12.4*   < > 12.3*  HCT 40.2   < > 39.5  PLT 313  --   --    < > = values in this interval not displayed.   ------------------------------------------------------------------------------------------------------------------  Chemistries  Recent Labs  Lab 03/30/19 1930 03/31/19 0420  NA 139 139  K 3.5 4.3  CL 105 106  CO2 24 24  GLUCOSE 141* 94  BUN 13 14  CREATININE 0.92 0.96  CALCIUM 8.4* 8.1*  AST 51*  --   ALT 95*  --   ALKPHOS 96  --   BILITOT 1.7*  --    ------------------------------------------------------------------------------------------------------------------  Cardiac Enzymes No results for input(s): TROPONINI in the last 168 hours. ------------------------------------------------------------------------------------------------------------------  RADIOLOGY:  Dg Knee 1-2 Views Left  Result Date: 03/31/2019 CLINICAL DATA:  LEFT knee pain starting yesterday.  No known injury. EXAM: LEFT KNEE - 1-2 VIEW COMPARISON:  None. FINDINGS: No evidence of fracture, dislocation, or joint effusion. No evidence of arthropathy or other focal bone abnormality. Soft tissues are unremarkable.  IMPRESSION: Normal plain film of the LEFT knee. Electronically Signed   By: Franki Cabot M.D.   On: 03/31/2019 11:53    ASSESSMENT AND PLAN:   Active Problems:   GI bleed  GI bleed- in the setting  of Xarelto use.   Patient has never had an endoscopy or colonoscopy. Hemoglobin is at baseline. -GI consult appreciated.  As patient is high risk due to low EF, suggested to manage conservatively and if bleed again then we may have to get bleeding scan.  GI suggested to start Xarelto depending on risk versus benefit for his cardiac issues and watch him in the hospital for any rebleeds. -Holding Xarelto (last dose 03/30/19 evening)-resume now 04/01/19-I will skip loading higher dose of Xarelto and will just start on maintenance dose. -IV PPI -Serial H/H- stable  Recent PE with chronic RLE DVT- recently hospitalized from 7/10-7/15 with PE -Resume Xarelto maintenance dose.  We will monitor in hospital for 2 days for any worsening in the bleed.  New diagnosis of systolic CHF- new diagnosis at last hospitalization. ECHO with EF 20-25%. Cardiac cath 7/14 with nonobstructive CAD. Appears euvoluemic on exam. -Continue Coreg, Lasix, losartan  Elevated liver enzymes- improved from last hospitalization. RUQ ultrasound 7/14 showed cholelithiasis and gallbladder wall thickening.  Surgery recommended outpatient follow-up. -Needs to follow-up with Dr. Windell Moment as an outpatient  Hyperlipidemia- stable -Continue home lipitor     All the records are reviewed and case discussed with Care Management/Social Workerr. Management plans discussed with the patient, family and they are in agreement.  CODE STATUS: Full.  TOTAL TIME TAKING CARE OF THIS PATIENT: 35 minutes.     POSSIBLE D/C IN 1-2 DAYS, DEPENDING ON CLINICAL CONDITION.   [name] M.D on 04/01/2019   Between 7am to 6pm - Pager - [phone]  After 6pm go to [email or web address] - password [email or web address] [facility]  Office  [phone]  CC: Primary care physician; Patient, No Pcp Per  Note: This dictation was prepared with Dragon dictation along with smaller phrase technology. Any transcriptional errors that  result from this process are unintentional.

## 2019-04-02 DIAGNOSIS — K625 Hemorrhage of anus and rectum: Secondary | ICD-10-CM

## 2019-04-02 LAB — COMPREHENSIVE METABOLIC PANEL
ALT: 59 U/L — ABNORMAL HIGH (ref 0–44)
AST: 33 U/L (ref 15–41)
Albumin: 3.2 g/dL — ABNORMAL LOW (ref 3.5–5.0)
Alkaline Phosphatase: 85 U/L (ref 38–126)
Anion gap: 9 (ref 5–15)
BUN: 21 mg/dL — ABNORMAL HIGH (ref 6–20)
CO2: 25 mmol/L (ref 22–32)
Calcium: 8.3 mg/dL — ABNORMAL LOW (ref 8.9–10.3)
Chloride: 102 mmol/L (ref 98–111)
Creatinine, Ser: 0.77 mg/dL (ref 0.61–1.24)
GFR calc Af Amer: >60 mL/min (ref >60)
GFR calc non Af Amer: >60 mL/min (ref >60)
Glucose, Bld: 95 mg/dL (ref 70–99)
Potassium: 4 mmol/L (ref 3.5–5.1)
Sodium: 136 mmol/L (ref 135–145)
Total Bilirubin: 1.1 mg/dL (ref 0.3–1.2)
Total Protein: 6.8 g/dL (ref 6.5–8.1)

## 2019-04-02 LAB — CBC
HCT: 39.4 % (ref 39.0–52.0)
Hemoglobin: 12.1 g/dL — ABNORMAL LOW (ref 13.0–17.0)
MCH: 27.1 pg (ref 26.0–34.0)
MCHC: 30.7 g/dL (ref 30.0–36.0)
MCV: 88.3 fL (ref 80.0–100.0)
Platelets: 303 10*3/uL (ref 150–400)
RBC: 4.46 MIL/uL (ref 4.22–5.81)
RDW: 16.1 % — ABNORMAL HIGH (ref 11.5–15.5)
WBC: 5.7 10*3/uL (ref 4.0–10.5)
nRBC: 0 % (ref 0.0–0.2)

## 2019-04-02 LAB — FACTOR 5 LEIDEN

## 2019-04-02 MED ORDER — HYDROCORTISONE ACETATE 25 MG RE SUPP
25.0000 mg | Freq: Two times a day (BID) | RECTAL | Status: DC
  Filled 2019-04-02 (×4): qty 1

## 2019-04-02 MED ORDER — PANTOPRAZOLE SODIUM 40 MG PO TBEC
40.0000 mg | DELAYED_RELEASE_TABLET | Freq: Every day | ORAL | Status: DC
  Administered 2019-04-02 – 2019-04-03 (×2): 40 mg via ORAL
  Filled 2019-04-02 (×2): qty 1

## 2019-04-02 NOTE — Progress Notes (Signed)
David Carlson at [place] NAME: David Carlson    MR#:  [id number]  DATE OF BIRTH:  [birth year]  SUBJECTIVE:  CHIEF COMPLAINT:   Chief Complaint  Patient presents with  . GI Bleeding   He was recently started on Xarelto for PE.  Also noted to have very low EF in last admission and cardiac cath was done.  For last 2 days he was having blood in the stool without abdominal pain or hematemesis Denies over-the-counter pain medication use or using BC powders or aspirin. Overnight he did not had any big bowel movement and no blood while wiping.  REVIEW OF SYSTEMS:  CONSTITUTIONAL: No fever, fatigue or weakness.  EYES: No blurred or double vision.  EARS, NOSE, AND THROAT: No tinnitus or ear pain.  RESPIRATORY: No cough, shortness of breath, wheezing or hemoptysis.  CARDIOVASCULAR: No chest pain, orthopnea, edema.  GASTROINTESTINAL: No nausea, vomiting, diarrhea or abdominal pain.  GENITOURINARY: No dysuria, hematuria.  ENDOCRINE: No polyuria, nocturia,  HEMATOLOGY: No anemia, easy bruising or bleeding SKIN: No rash or lesion. MUSCULOSKELETAL: No joint pain or arthritis.   NEUROLOGIC: No tingling, numbness, weakness.  PSYCHIATRY: No anxiety or depression.   ROS  DRUG ALLERGIES:  No Known Allergies  VITALS:  Blood pressure 115/89, pulse 100, temperature 99.3 F (37.4 C), temperature source Oral, resp. rate 15, height 5\' 10"  (1.778 m), weight 89.3 kg, SpO2 97 %.  PHYSICAL EXAMINATION:  Physical Exam   GENERAL:  48 y.o.-year-old patient lying in the bed with no acute distress.  EYES: Pupils equal, round, reactive to light and accommodation. No scleral icterus. Extraocular muscles intact.  HEENT: Head atraumatic, normocephalic. Oropharynx and nasopharynx clear.  NECK:  Supple, no jugular venous distention. No thyroid enlargement, no tenderness.  LUNGS: Normal breath sounds bilaterally, no wheezing, rales,rhonchi or crepitation. No use of  accessory muscles of respiration.  CARDIOVASCULAR: Tachycardic, regular rhythm, S1, S2 normal. No murmurs, rubs, or gallops.  ABDOMEN: Soft, nontender, nondistended. Bowel sounds present. No organomegaly or mass.  EXTREMITIES: No pedal edema, cyanosis, or clubbing.  NEUROLOGIC: Cranial nerves II through XII are intact. Muscle strength 5/5 in all extremities. Sensation intact. Gait not checked.  PSYCHIATRIC: The patient is alert and oriented x 3.  SKIN: No obvious rash, lesion, or ulcer. LABORATORY PANEL:   CBC Recent Labs  Lab 04/02/19 0440  WBC 5.7  HGB 12.1*  HCT 39.4  PLT 303   ------------------------------------------------------------------------------------------------------------------  Chemistries  Recent Labs  Lab 04/02/19 0440  NA 136  K 4.0  CL 102  CO2 25  GLUCOSE 95  BUN 21*  CREATININE 0.77  CALCIUM 8.3*  AST 33  ALT 59*  ALKPHOS 85  BILITOT 1.1   ------------------------------------------------------------------------------------------------------------------  Cardiac Enzymes No results for input(s): TROPONINI in the last 168 hours. ------------------------------------------------------------------------------------------------------------------  RADIOLOGY:  No results found.  ASSESSMENT AND PLAN:   Active Problems:   GI bleed  GI bleed- in the setting of Xarelto use.   Patient has never had an endoscopy or colonoscopy. Hemoglobin is at baseline. -GI consult appreciated.  As patient is high risk due to low EF, suggested to manage conservatively and if bleed again then we may have to get bleeding scan.  GI suggested to start Xarelto depending on risk versus benefit for his cardiac issues and watch him in the hospital for any rebleeds. -Holding Xarelto (last dose 03/30/19 evening)-resume now 04/01/19- skip loading higher dose of Xarelto and will just start on maintenance dose. -IV  PPI-  change to oral. -Serial H/H- stable  Recent PE with chronic  RLE DVT- recently hospitalized from 7/10-7/15 with PE -Resume Xarelto maintenance dose.  We will monitor in hospital for 2 days for any worsening in the bleed.  New diagnosis of systolic CHF- new diagnosis at last hospitalization. ECHO with EF 20-25%. Cardiac cath 7/14 with nonobstructive CAD. Appears euvoluemic on exam. -Continue Coreg, Lasix, losartan  Elevated liver enzymes- improved from last hospitalization. RUQ ultrasound 7/14 showed cholelithiasis and gallbladder wall thickening.  Surgery recommended outpatient follow-up. -Needs to follow-up with Dr. Windell Moment as an outpatient  Hyperlipidemia- stable -Continue home lipitor     All the records are reviewed and case discussed with Care Management/Social Workerr. Management plans discussed with the patient, family and they are in agreement.  CODE STATUS: Full.  TOTAL TIME TAKING CARE OF THIS PATIENT: 35 minutes.     POSSIBLE D/C IN 1-2 DAYS, DEPENDING ON CLINICAL CONDITION.   David Carlson M.D on 04/02/2019   Between 7am to 6pm - Pager - [phone]  After 6pm go to [email or web address] - password [email or web address] [facility]  Office  [phone]  CC: Primary care physician; Patient, No Pcp Per  Note: This dictation was prepared with Dragon dictation along with smaller phrase technology. Any transcriptional errors that result from this process are unintentional.

## 2019-04-02 NOTE — Progress Notes (Signed)
Jonathon Bellows , MD [address], [place], [place], [place], [address] [address], [place], [place], [place], [address] Phone: [phone]  Fax: [phone]   David Carlson is being followed for Hematochezia   Subjective: No more bleeding   Objective: Vital signs in last 24 hours: Vitals:   04/01/19 1624 04/01/19 2109 04/02/19 0455 04/02/19 0917  BP: 121/89 117/85 111/89 121/89  Pulse: 94 100 96 98  Resp:  20 20   Temp:  98.4 F (36.9 C) 97.9 F (36.6 C)   TempSrc:  Oral Oral   SpO2:  98% 98% 99%  Weight:      Height:       Weight change:   Intake/Output Summary (Last 24 hours) at 04/02/2019 1008 Last data filed at 04/02/2019 0900 Gross per 24 hour  Intake 1200 ml  Output 0 ml  Net 1200 ml     Exam: Heart:: Regular rate and rhythm, S1S2 present or without murmur or extra heart sounds Lungs: normal, clear to auscultation and clear to auscultation and percussion Abdomen: soft, nontender, normal bowel sounds   Lab Results: @LABTEST2 @ Micro Results: Recent Results (from the past 240 hour(s))  SARS Coronavirus 2 (CEPHEID - Performed in [place] hospital lab), Hosp Order     Status: None   Collection Time: 03/23/19 12:49 PM   Specimen: Nasopharyngeal Swab  Result Value Ref Range Status   SARS Coronavirus 2 NEGATIVE NEGATIVE Final    Comment: (NOTE) If result is NEGATIVE SARS-CoV-2 target nucleic acids are NOT DETECTED. The SARS-CoV-2 RNA is generally detectable in upper and lower  respiratory specimens during the acute phase of infection. The lowest  concentration of SARS-CoV-2 viral copies this assay can detect is 250  copies / mL. A negative result does not preclude SARS-CoV-2 infection  and should not be used as the sole basis for treatment or other  patient management decisions.  A negative result may occur with  improper specimen collection / handling, submission of specimen other  than nasopharyngeal swab, presence of viral mutation(s)  within the  areas targeted by this assay, and inadequate number of viral copies  (<250 copies / mL). A negative result must be combined with clinical  observations, patient history, and epidemiological information. If result is POSITIVE SARS-CoV-2 target nucleic acids are DETECTED. The SARS-CoV-2 RNA is generally detectable in upper and lower  respiratory specimens dur ing the acute phase of infection.  Positive  results are indicative of active infection with SARS-CoV-2.  Clinical  correlation with patient history and other diagnostic information is  necessary to determine patient infection status.  Positive results do  not rule out bacterial infection or co-infection with other viruses. If result is PRESUMPTIVE POSTIVE SARS-CoV-2 nucleic acids MAY BE PRESENT.   A presumptive positive result was obtained on the submitted specimen  and confirmed on repeat testing.  While 2019 novel coronavirus  (SARS-CoV-2) nucleic acids may be present in the submitted sample  additional confirmatory testing may be necessary for epidemiological  and / or clinical management purposes  to differentiate between  SARS-CoV-2 and other Sarbecovirus currently known to infect humans.  If clinically indicated additional testing with an alternate test  methodology [phone]) is advised. The SARS-CoV-2 RNA is generally  detectable in upper and lower respiratory sp ecimens during the acute  phase of infection. The expected result is Negative. Fact Sheet for Patients:  StrictlyIdeas.no Fact Sheet for Healthcare Providers: BankingDealers.co.za This test is not yet approved or cleared  by the [place] and has been authorized for detection and/or diagnosis of SARS-CoV-2 by FDA under an Emergency Use Authorization (EUA).  This EUA will remain in effect (meaning this test can be used) for the duration of the COVID-19 declaration under Section 564(b)(1) of the Act,  21 U.S.C. section 360bbb-3(b)(1), unless the authorization is terminated or revoked sooner. Performed at [facility], [address], [place], [place] [address]   SARS Coronavirus 2 (CEPHEID - Performed in [facility] hospital lab), Hosp Order     Status: None   Collection Time: 03/30/19  8:54 PM   Specimen: Nasopharyngeal Swab  Result Value Ref Range Status   SARS Coronavirus 2 NEGATIVE NEGATIVE Final    Comment: (NOTE) If result is NEGATIVE SARS-CoV-2 target nucleic acids are NOT DETECTED. The SARS-CoV-2 RNA is generally detectable in upper and lower  respiratory specimens during the acute phase of infection. The lowest  concentration of SARS-CoV-2 viral copies this assay can detect is 250  copies / mL. A negative result does not preclude SARS-CoV-2 infection  and should not be used as the sole basis for treatment or other  patient management decisions.  A negative result may occur with  improper specimen collection / handling, submission of specimen other  than nasopharyngeal swab, presence of viral mutation(s) within the  areas targeted by this assay, and inadequate number of viral copies  (<250 copies / mL). A negative result must be combined with clinical  observations, patient history, and epidemiological information. If result is POSITIVE SARS-CoV-2 target nucleic acids are DETECTED. The SARS-CoV-2 RNA is generally detectable in upper and lower  respiratory specimens dur ing the acute phase of infection.  Positive  results are indicative of active infection with SARS-CoV-2.  Clinical  correlation with patient history and other diagnostic information is  necessary to determine patient infection status.  Positive results do  not rule out bacterial infection or co-infection with other viruses. If result is PRESUMPTIVE POSTIVE SARS-CoV-2 nucleic acids MAY BE PRESENT.   A presumptive positive result was obtained on the submitted specimen  and confirmed on repeat  testing.  While 2019 novel coronavirus  (SARS-CoV-2) nucleic acids may be present in the submitted sample  additional confirmatory testing may be necessary for epidemiological  and / or clinical management purposes  to differentiate between  SARS-CoV-2 and other Sarbecovirus currently known to infect humans.  If clinically indicated additional testing with an alternate test  methodology [phone]) is advised. The SARS-CoV-2 RNA is generally  detectable in upper and lower respiratory sp ecimens during the acute  phase of infection. The expected result is Negative. Fact Sheet for Patients:  StrictlyIdeas.no Fact Sheet for Healthcare Providers: BankingDealers.co.za This test is not yet approved or cleared by the [place] FDA and has been authorized for detection and/or diagnosis of SARS-CoV-2 by FDA under an Emergency Use Authorization (EUA).  This EUA will remain in effect (meaning this test can be used) for the duration of the COVID-19 declaration under Section 564(b)(1) of the Act, 21 U.S.C. section 360bbb-3(b)(1), unless the authorization is terminated or revoked sooner. Performed at [facility], [address] [place], [place] [address]    Studies/Results: Dg Knee 1-2 Views Left  Result Date: 03/31/2019 CLINICAL DATA:  LEFT knee pain starting yesterday.  No known injury. EXAM: LEFT KNEE - 1-2 VIEW COMPARISON:  None. FINDINGS: No evidence of fracture, dislocation, or joint effusion. No evidence of arthropathy or other focal bone abnormality. Soft tissues are  unremarkable. IMPRESSION: Normal plain film of the LEFT knee. Electronically Signed   By: Franki Cabot M.D.   On: 03/31/2019 11:53   Medications: I have reviewed the patient's current medications. Scheduled Meds: . atorvastatin  20 mg Oral q1800  . carvedilol  6.25 mg Oral BID WC  . fluticasone  2 spray Each Nare Daily  . furosemide  40 mg Oral Daily  . losartan   25 mg Oral Daily  . pantoprazole  40 mg Oral Daily  . rivaroxaban  20 mg Oral Daily   Continuous Infusions: PRN Meds:.acetaminophen **OR** acetaminophen, albuterol, cyclobenzaprine, ondansetron **OR** ondansetron (ZOFRAN) IV, polyethylene glycol   Assessment: Active Problems:   GI bleed   [name] 48 y.o. male with no acute PE started on Xarelto and found to have a very low ejection fraction.  We are being followed for some blood in the stool.  Hemoglobin has been stable greater than 12 g for the past 3 days.   Plan: 1.  With an acute PE risk of anesthesia for any endoscopy procedure would be extremely high hence should only be performed in a life-threatening situation.  Since his hemoglobin has been stable for 3 days I would suggest to hold off on any endoscopy procedures and follow his hemoglobin and clinical symptoms.  He would require outpatient evaluation for a colonoscopy once he has been on anticoagulation at least for a few weeks when it would be safe to perform an endoscopy procedure.  In the meanwhile I would suggest conservative management to treat empirically for any hemorrhoids that are bleeding with daily sitz bath's, Anusol suppository for 5 days.  Avoid excessive rubbing after bowel movement to clean rather than use a shower and pat dry afterwards.  I will sign off.  Please call me if any further GI concerns or questions.  We would like to thank you for the opportunity to participate in the care of [name].    LOS: 3 days   Jonathon Bellows, MD 04/02/2019, 10:08 AM2

## 2019-04-02 NOTE — Telephone Encounter (Signed)
Reviewed the patient's chart.  He is currently hospitalized for his GI bleeding.  He did go to the ER on 7/17 as recommended.   He has follow up pending with Dr. Rockey Situ on 04/04/19.

## 2019-04-03 ENCOUNTER — Telehealth: Payer: Self-pay | Admitting: Cardiovascular Disease

## 2019-04-03 LAB — PROTHROMBIN GENE MUTATION

## 2019-04-03 LAB — CBC
HCT: 40.1 % (ref 39.0–52.0)
Hemoglobin: 12.4 g/dL — ABNORMAL LOW (ref 13.0–17.0)
MCH: 26.9 pg (ref 26.0–34.0)
MCHC: 30.9 g/dL (ref 30.0–36.0)
MCV: 87 fL (ref 80.0–100.0)
Platelets: 328 10*3/uL (ref 150–400)
RBC: 4.61 MIL/uL (ref 4.22–5.81)
RDW: 16 % — ABNORMAL HIGH (ref 11.5–15.5)
WBC: 6.5 10*3/uL (ref 4.0–10.5)
nRBC: 0 % (ref 0.0–0.2)

## 2019-04-03 MED ORDER — SENNOSIDES-DOCUSATE SODIUM 8.6-50 MG PO TABS: 2.0000 | ORAL_TABLET | Freq: Every day | ORAL | 1 refills | Status: DC | PRN

## 2019-04-03 MED ORDER — HYDROCORTISONE ACETATE 25 MG RE SUPP: 25.0000 mg | Freq: Two times a day (BID) | RECTAL | 0 refills | Status: AC

## 2019-04-03 MED ORDER — RIVAROXABAN 20 MG PO TABS: 20.0000 mg | ORAL_TABLET | Freq: Every day | ORAL | 1 refills | Status: DC

## 2019-04-03 MED ORDER — POLYETHYLENE GLYCOL 3350 17 G PO PACK: 17.0000 g | PACK | Freq: Every day | ORAL | 0 refills | Status: DC | PRN

## 2019-04-03 NOTE — Telephone Encounter (Signed)
Virtual Visit Pre-Appointment Phone Call  "(Name), I am calling you today to discuss your upcoming appointment. We are currently trying to limit exposure to the virus that causes COVID-19 by seeing patients at home rather than in the office."  1. "What is the BEST phone number to call the day of the visit?" - include this in appointment notes  2. Do you have or have access to (through a family member/friend) a smartphone with video capability that we can use for your visit?" a. If yes - list this number in appt notes as cell (if different from BEST phone #) and list the appointment type as a VIDEO visit in appointment notes b. If no - list the appointment type as a PHONE visit in appointment notes  3. Confirm consent - "In the setting of the current Covid19 crisis, you are scheduled for a (phone or video) visit with your provider on (date) at (time).  Just as we do with many in-office visits, in order for you to participate in this visit, we must obtain consent.  If you'd like, I can send this to your mychart (if signed up) or email for you to review.  Otherwise, I can obtain your verbal consent now.  All virtual visits are billed to your insurance company just like a normal visit would be.  By agreeing to a virtual visit, we'd like you to understand that the technology does not allow for your provider to perform an examination, and thus may limit your provider's ability to fully assess your condition. If your provider identifies any concerns that need to be evaluated in person, we will make arrangements to do so.  Finally, though the technology is pretty good, we cannot assure that it will always work on either your or our end, and in the setting of a video visit, we may have to convert it to a phone-only visit.  In either situation, we cannot ensure that we have a secure connection.  Are you willing to proceed?" STAFF: Did the patient verbally acknowledge consent to telehealth visit? Document  YES/NO here: YES  4. Advise patient to be prepared - "Two hours prior to your appointment, go ahead and check your blood pressure, pulse, oxygen saturation, and your weight (if you have the equipment to check those) and write them all down. When your visit starts, your provider will ask you for this information. If you have an Apple Watch or Kardia device, please plan to have heart rate information ready on the day of your appointment. Please have a pen and paper handy nearby the day of the visit as well."  5. Give patient instructions for MyChart download to smartphone OR [email or web address] as below if video visit (depending on what platform provider is using)  6. Inform patient they will receive a phone call 15 minutes prior to their appointment time (may be from unknown caller ID) so they should be prepared to answer    [place] Carlson has been deemed a candidate for a follow-up tele-health visit to limit community exposure during the Covid-19 pandemic. I spoke with the patient via phone to ensure availability of phone/video source, confirm preferred email & phone number, and discuss instructions and expectations.  I reminded David Carlson to be prepared with any vital sign and/or heart rhythm information that could potentially be obtained via home monitoring, at the time of his visit. I reminded David Carlson to expect a phone  call prior to his visit.  Clarisse Gouge 04/03/2019 8:48 AM   INSTRUCTIONS FOR DOWNLOADING THE MYCHART APP TO SMARTPHONE  - The patient must first make sure to have activated MyChart and know their login information - If Apple, go to [employer] and type in MyChart in the search bar and download the app. If Android, ask patient to go to [employer] and type in [place] in the search bar and download the app. The app is free but as with any other app downloads, their phone may require them to verify saved payment  information or Apple/Android password.  - The patient will need to then log into the app with their MyChart username and password, and select [facility] as their healthcare provider to link the account. When it is time for your visit, go to the MyChart app, find appointments, and click Begin Video Visit. Be sure to Select Allow for your device to access the Microphone and Camera for your visit. You will then be connected, and your provider will be with you shortly.  **If they have any issues connecting, or need assistance please contact MyChart service desk [phone])**  **If using a computer, in order to ensure the best quality for their visit they will need to use either of the following Internet Browsers: [employer], or Google Chrome**  IF USING DOXIMITY or DOXY.ME - The patient will receive a link just prior to their visit by text.     FULL LENGTH CONSENT FOR TELE-HEALTH VISIT   I hereby voluntarily request, consent and authorize [facility] and its employed or contracted physicians, physician assistants, nurse practitioners or other licensed health care professionals (the Practitioner), to provide me with telemedicine health care services (the Services") as deemed necessary by the treating Practitioner. I acknowledge and consent to receive the Services by the Practitioner via telemedicine. I understand that the telemedicine visit will involve communicating with the Practitioner through live audiovisual communication technology and the disclosure of certain medical information by electronic transmission. I acknowledge that I have been given the opportunity to request an in-person assessment or other available alternative prior to the telemedicine visit and am voluntarily participating in the telemedicine visit.  I understand that I have the right to withhold or withdraw my consent to the use of telemedicine in the course of my care at any time, without affecting my right  to future care or treatment, and that the Practitioner or I may terminate the telemedicine visit at any time. I understand that I have the right to inspect all information obtained and/or recorded in the course of the telemedicine visit and may receive copies of available information for a reasonable fee.  I understand that some of the potential risks of receiving the Services via telemedicine include:   Delay or interruption in medical evaluation due to technological equipment failure or disruption;  Information transmitted may not be sufficient (e.g. poor resolution of images) to allow for appropriate medical decision making by the Practitioner; and/or   In rare instances, security protocols could fail, causing a breach of personal health information.  Furthermore, I acknowledge that it is my responsibility to provide information about my medical history, conditions and care that is complete and accurate to the best of my ability. I acknowledge that Practitioner's advice, recommendations, and/or decision may be based on factors not within their control, such as incomplete or inaccurate data provided by me or distortions of diagnostic images or specimens that may result from  electronic transmissions. I understand that the practice of medicine is not an exact science and that Practitioner makes no warranties or guarantees regarding treatment outcomes. I acknowledge that I will receive a copy of this consent concurrently upon execution via email to the email address I last provided but may also request a printed copy by calling the office of [facility].    I understand that my insurance will be billed for this visit.   I have read or had this consent read to me.  I understand the contents of this consent, which adequately explains the benefits and risks of the Services being provided via telemedicine.   I have been provided ample opportunity to ask questions regarding this consent and the Services  and have had my questions answered to my satisfaction.  I give my informed consent for the services to be provided through the use of telemedicine in my medical care  By participating in this telemedicine visit I agree to the above.

## 2019-04-03 NOTE — Progress Notes (Signed)
Virtual Visit via Video Note   This visit type was conducted due to national recommendations for restrictions regarding the COVID-19 Pandemic (e.g. social distancing) in an effort to limit this patient's exposure and mitigate transmission in our community.  Due to his co-morbid illnesses, this patient is at least at moderate risk for complications without adequate follow up.  This format is felt to be most appropriate for this patient at this time.  All issues noted in this document were discussed and addressed.  A limited physical exam was performed with this format.  Please refer to the patient's chart for his consent to telehealth for [facility].   I connected with  David David Carlson on 04/04/19 by a video enabled telemedicine application and verified that I am speaking with the correct person using two identifiers. I discussed the limitations of evaluation and management by telemedicine. The patient expressed understanding and agreed to proceed.   Evaluation Performed:  Follow-up visit  Date:  04/04/2019   ID:  David David Carlson, DOB [birth year], MRN [id number]  Patient Location:  [place] [address]   Provider location:   [facility], [place] office  PCP:  Patient, No Pcp Per  Cardiologist:  Patsy Baltimore   Chief Complaint: Hospital discharge for pulmonary embolism, cardiomyopathy nonischemic    History of Present Illness:    SOCRATES CAHOON David Carlson is a 48 y.o. male who presents via audio/video conferencing for a telehealth visit today.   The patient does not symptoms concerning for COVID-19 infection (fever, chills, cough, or new SHORTNESS OF BREATH).   Patient has a past medical history of palpitations,  hypertension,  Hyperlipidemia,  obesity,  GERD,  anxiety,  PVCs,  Admitted to the hospital July 2020 for  HFrEF /cardiomyopathy with EF 25% Chronic DVT, pulmonary embolism Who presents for follow-up in the  clinic for follow-up of his nonischemic cardiomyopathy and pulmonary embolism, post discharge  Presented to the hospital with leg swelling, shortness of breath Seen in the clinic  LE [place] was obtained to r/o DVT and did not show anything on the L side but did show chronic clot on the right lower extremity. He was then sent for STAT CTA of the chest to r/o PE. This showed multifocal occlusive segmental to subsegmental bilateral lower lobe emboli with concern for developing pulmonary infarction.  Referred to the emergency room  Ejection fraction 20 to 25%  Cardiac catheterization  with nonobstructive disease  Started on Coreg 6.25 twice daily, Lasix 40 daily,  losartan 25 daily  Shortness of breath, some chest discomfort on inspiration on the right side radiating through to his back felt secondary to pulmonary embolism Started on Xarelto 15 twice daily  While in the hospital Ultrasound performed recently showing gallstones, no significant abdominal pain  He is completed MRCP with and without contrast, results pending  Fatigue, Breathing ok, can't over exert  Weight 190 at home No SOB No ABD swelling Tolerating xareltro 20.  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 Anxiety   . Broken shoulder   . Collar bone fracture    x 2  . Erectile dysfunction   . GERD (  gastroesophageal reflux disease)   . H/O Bell's palsy   . Hyperlipidemia   . Hypertension   . Obesity   . Palpitations    a. Previously evaluted when he was incarcerated in [place] - reportedly nl stress test.  . PVC's (premature ventricular contractions)    a. 04/2013 Echo: EF 50-55%, Gr1 DD, mildly dil LA;  b. 04/2017 48hr holter: freq PVCs (5.4%), rare PACs. Single 13 beat  atrial run - 143 bpm.   Past Surgical History:  Procedure Laterality Date  . CARPAL TUNNEL RELEASE Bilateral   . HERNIA REPAIR  2015  . HIP SURGERY     a. Age 55 - failure of growth plate to close - Pins inserted  . LEFT HEART CATH AND CORONARY ANGIOGRAPHY N/A 03/27/2019   Procedure: LEFT HEART CATH AND CORONARY ANGIOGRAPHY;  Surgeon: Nelva Bush, MD;  Location: [facility];  Service: Cardiovascular;  Laterality: N/A;     Current Meds  Medication Sig  . albuterol (VENTOLIN HFA) 108 (90 Base) MCG/ACT inhaler Inhale 2 puffs into the lungs every 6 (six) hours as needed for wheezing or shortness of breath.  Marland Kitchen atorvastatin (LIPITOR) 20 MG tablet Take 1 tablet (20 mg total) by mouth daily at 6 PM.  . carvedilol (COREG) 6.25 MG tablet Take 1 tablet (6.25 mg total) by mouth 2 (two) times daily with a meal.  . cyclobenzaprine (FLEXERIL) 10 MG tablet Take 10 mg by mouth 3 (three) times daily as needed. for muscle spams  . fluticasone (FLONASE) 50 MCG/ACT nasal spray Place 2 sprays into both nostrils daily.  . furosemide (LASIX) 40 MG tablet Take 1 tablet (40 mg total) by mouth daily.  . hydrocortisone (ANUSOL-HC) 25 MG suppository Place 1 suppository (25 mg total) rectally 2 (two) times daily for 6 days.  Marland Kitchen losartan (COZAAR) 25 MG tablet Take 1 tablet (25 mg total) by mouth daily.  . Multiple Vitamin (MULTIVITAMIN WITH MINERALS) TABS tablet Take 1 tablet by mouth daily.  . pantoprazole (PROTONIX) 40 MG tablet Take 1 tablet (40 mg total) by mouth daily as needed. Caution:prolonged use may cause health problems; appt needed (Patient taking differently: Take 40 mg by mouth daily as needed (GERD symptoms). Caution:prolonged use may cause health problems; appt needed)  . polyethylene glycol (MIRALAX / GLYCOLAX) 17 g packet Take 17 g by mouth daily as needed for mild constipation.  . rivaroxaban (XARELTO) 20 MG TABS tablet Take 1 tablet (20 mg total) by mouth daily.  Marland Kitchen senna-docusate  (SENOKOT-S) 8.6-50 MG tablet Take 2 tablets by mouth daily as needed for mild constipation.     Allergies:   Patient has no known allergies.   Social History   Tobacco Use  . Smoking status: Former Smoker    Packs/day: 1.00    Years: 20.00    Pack years: 20.00    Types: Cigarettes    Quit date: 09/15/2015    Years since quitting: 3.5  . Smokeless tobacco: Never Used  Substance Use Topics  . Alcohol use: Yes    Alcohol/week: 0.0 standard drinks    Comment: 3-6 cans/beer on weeknights, 6-12 beers on weekends.  . Drug use: No     Family Hx: The patient's family history includes Diabetes in his maternal grandfather, mother, and paternal grandfather; Heart attack in his maternal grandmother; Heart disease in his maternal grandmother; Hypertension in his father.  ROS:   Please see the history of present illness.    Review of Systems  Constitutional: Negative.  HENT: Negative.   Respiratory: Negative.   Cardiovascular: Negative.   Gastrointestinal: Negative.   Musculoskeletal: Negative.   Neurological: Negative.   Psychiatric/Behavioral: Negative.   All other systems reviewed and are negative.    Labs/Other Tests and Data Reviewed:    Recent Labs: 02/26/2019: TSH 0.82 03/25/2019: Magnesium 1.7 04/02/2019: ALT 59; BUN 21; Creatinine, Ser 0.77; Potassium 4.0; Sodium 136 04/03/2019: Hemoglobin 12.4; Platelets 328   Recent Lipid Panel Lab Results  Component Value Date/Time   CHOL 69 03/26/2019 12:22 PM   CHOL 196 01/13/2016 10:39 AM   TRIG 53 03/26/2019 12:22 PM   HDL 12 (L) 03/26/2019 12:22 PM   HDL 35 (L) 01/13/2016 10:39 AM   CHOLHDL 5.8 03/26/2019 12:22 PM   LDLCALC 46 03/26/2019 12:22 PM   [place] 80 02/26/2019 04:39 PM    Wt Readings from Last 3 Encounters:  04/04/19 190 lb (86.2 kg)  03/30/19 196 lb 13.9 oz (89.3 kg)  03/26/19 218 lb 14.7 oz (99.3 kg)     Exam:    Vital Signs: Vital signs may also be detailed in the HPI BP (!) 165/95   Pulse 100   Ht 5'  10" (1.778 m)   Wt 190 lb (86.2 kg)   BMI 27.26 kg/m   Wt Readings from Last 3 Encounters:  04/04/19 190 lb (86.2 kg)  03/30/19 196 lb 13.9 oz (89.3 kg)  03/26/19 218 lb 14.7 oz (99.3 kg)   Temp Readings from Last 3 Encounters:  04/03/19 97.8 F (36.6 C) (Oral)  03/28/19 98.1 F (36.7 C)  02/26/19 97.6 F (36.4 C) (Oral)   BP Readings from Last 3 Encounters:  04/04/19 (!) 165/95  04/03/19 115/82  03/28/19 124/88   Pulse Readings from Last 3 Encounters:  04/04/19 100  04/03/19 92  03/28/19 90    120/92, pulse 90 Respirations 81  Well nourished, well developed male in no acute distress. Constitutional:  oriented to person, place, and time. No distress.  Head: Normocephalic and atraumatic.  Eyes:  no discharge. No scleral icterus.  Neck: Normal range of motion. Neck supple.  Pulmonary/Chest: No audible wheezing, no distress, appears comfortable Musculoskeletal: Normal range of motion.  no  tenderness or deformity.  Neurological:   Coordination normal. Full exam not performed Skin:  No rash Psychiatric:  normal mood and affect. behavior is normal. Thought content normal.    ASSESSMENT & PLAN:    Problem List Items Addressed This Visit      Cardiology Problems   Cardiomyopathy [facility])   Acute systolic heart failure (HCC) - Primary   Pulmonary embolism ([place])      Long discussion concerning recent hospital course Discussed CT scan findings bilateral PE predominantly in the lower lung fields Tolerating Xarelto  Nonischemic cardiomyopathy Discussed medications, We will hold the losartan and start Entresto 24/26 mg p.o. twice daily He will monitor blood pressure over the next 2 weeks and call our office with numbers -Would hope to repeat lab work in 1 month, increase Entresto dosing as blood pressure tolerates  COVID-19 Education: The signs and symptoms of COVID-19 were discussed with the patient and how to seek care for testing (follow up with PCP or arrange  E-visit).  The importance of social distancing was discussed today.  Patient Risk:   After full review of this patients clinical status, I feel that they are at least moderate risk at this time.  Time:   Today, I have spent 45 minutes with the patient with telehealth technology discussing  the cardiac and medical problems/diagnoses detailed above   10 min spent reviewing the chart prior to patient visit today   Medication Adjustments/Labs and Tests Ordered: Current medicines are reviewed at length with the patient today.  Concerns regarding medicines are outlined above.   Tests Ordered: No tests ordered   Medication Changes: Changes as above   Disposition: Follow-up in 1 month   Signed, Ida Rogue, MD  [place] [facility] [address] [place] [address], [place], [place] [address]

## 2019-04-03 NOTE — Progress Notes (Signed)
Glean Salvo II  A and O x 4. VSS. Pt tolerating diet well. No complaints of pain or nausea. IV removed intact, prescriptions given. Pt voiced understanding of discharge instructions with no further questions. Pt discharged via wheelchair with NT.   Allergies as of 04/03/2019   No Known Allergies     Medication List    STOP taking these medications   Rivaroxaban 15 & 20 MG Tbpk Replaced by: rivaroxaban 20 MG Tabs tablet     TAKE these medications   albuterol 108 (90 Base) MCG/ACT inhaler Commonly known as: VENTOLIN HFA Inhale 2 puffs into the lungs every 6 (six) hours as needed for wheezing or shortness of breath.   atorvastatin 20 MG tablet Commonly known as: LIPITOR Take 1 tablet (20 mg total) by mouth daily at 6 PM. Notes to patient: 04/03/19   carvedilol 6.25 MG tablet Commonly known as: COREG Take 1 tablet (6.25 mg total) by mouth 2 (two) times daily with a meal. Notes to patient: 04/03/19   cyclobenzaprine 10 MG tablet Commonly known as: FLEXERIL Take 10 mg by mouth 3 (three) times daily as needed. for muscle spams   fluticasone 50 MCG/ACT nasal spray Commonly known as: FLONASE Place 2 sprays into both nostrils daily. Notes to patient: 04/04/19   furosemide 40 MG tablet Commonly known as: LASIX Take 1 tablet (40 mg total) by mouth daily. Notes to patient: 04/04/19   hydrocortisone 25 MG suppository Commonly known as: ANUSOL-HC Place 1 suppository (25 mg total) rectally 2 (two) times daily for 6 days. Notes to patient: 04/03/19   losartan 25 MG tablet Commonly known as: Cozaar Take 1 tablet (25 mg total) by mouth daily. Notes to patient: 04/04/19   multivitamin with minerals Tabs tablet Take 1 tablet by mouth daily. Notes to patient: 04/04/19   pantoprazole 40 MG tablet Commonly known as: PROTONIX Take 1 tablet (40 mg total) by mouth daily as needed. Caution:prolonged use may cause health problems; appt needed What changed: reasons to take this Notes to  patient: 04/04/19   polyethylene glycol 17 g packet Commonly known as: MIRALAX / GLYCOLAX Take 17 g by mouth daily as needed for mild constipation.   rivaroxaban 20 MG Tabs tablet Commonly known as: XARELTO Take 1 tablet (20 mg total) by mouth daily. Start taking on: April 04, 2019 Replaces: Rivaroxaban 15 & 20 MG Tbpk Notes to patient: 04/04/19   senna-docusate 8.6-50 MG tablet Commonly known as: Senokot-S Take 2 tablets by mouth daily as needed for mild constipation.       Vitals:   04/03/19 0823 04/03/19 1159  BP: 115/85 115/82  Pulse: 75 92  Resp:  15  Temp:  97.8 F (36.6 C)  SpO2:  99%    David Carlson

## 2019-04-04 ENCOUNTER — Other Ambulatory Visit: Payer: Self-pay

## 2019-04-04 ENCOUNTER — Encounter: Payer: Self-pay | Admitting: Cardiovascular Disease

## 2019-04-04 ENCOUNTER — Telehealth (INDEPENDENT_AMBULATORY_CARE_PROVIDER_SITE_OTHER): Payer: Managed Care, Other (non HMO) | Admitting: Cardiovascular Disease

## 2019-04-04 VITALS — BP 165/95 | HR 100 | Ht 70.0 in | Wt 190.0 lb

## 2019-04-04 DIAGNOSIS — I420 Dilated cardiomyopathy: Secondary | ICD-10-CM

## 2019-04-04 DIAGNOSIS — Z86711 Personal history of pulmonary embolism: Secondary | ICD-10-CM | POA: Diagnosis not present

## 2019-04-04 DIAGNOSIS — I2609 Other pulmonary embolism with acute cor pulmonale: Secondary | ICD-10-CM

## 2019-04-04 DIAGNOSIS — Z7901 Long term (current) use of anticoagulants: Secondary | ICD-10-CM | POA: Diagnosis not present

## 2019-04-04 DIAGNOSIS — I428 Other cardiomyopathies: Secondary | ICD-10-CM | POA: Diagnosis not present

## 2019-04-04 DIAGNOSIS — I5021 Acute systolic (congestive) heart failure: Secondary | ICD-10-CM

## 2019-04-04 MED ORDER — SACUBITRIL-VALSARTAN 24-26 MG PO TABS: 1.0000 | ORAL_TABLET | Freq: Two times a day (BID) | ORAL | 6 refills | Status: DC

## 2019-04-04 NOTE — Patient Instructions (Addendum)
Medication Instructions:  Your physician has recommended you make the following change in your medication:  1. STOP Losartan 2. START Entresto 24/26 mg twice a day. 30 day free code sent in with prescription.  Monitor blood pressure , call with numbers in next week or two  Your physician has requested that you regularly monitor and record your blood pressure readings at home. Please use the same machine at the same time of day to check your readings and record them to bring to your follow-up visit.  Please read below for more information on blood pressure monitoring.  If you need a refill on your cardiac medications before your next appointment, please call your pharmacy.    Lab work: No new labs needed   If you have labs (blood work) drawn today and your tests are completely normal, you will receive your results only by: Marland Kitchen MyChart Message (if you have MyChart) OR . A paper copy in the mail If you have any lab test that is abnormal or we need to change your treatment, we will call you to review the results.   Testing/Procedures: No new testing needed   Follow-Up: At [facility], you and your health needs are our priority.  As part of our continuing mission to provide you with exceptional heart care, we have created designated Provider Care Teams.  These Care Teams include your primary Cardiologist (physician) and Advanced Practice Providers (APPs -  Physician Assistants and Nurse Practitioners) who all work together to provide you with the care you need, when you need it.  . You will need a follow up appointment in 1 month preferably in the office as lab work will be needed   . Providers on your designated Care Team:   . Murray Hodgkins, NP . Christell Faith, PA-C . Marrianne Mood, PA-C  Any Other Special Instructions Will Be Listed Below (If Applicable).  For educational health videos Log in to : www.[email or web address] Or : [email or web address], password : [email or web address]   How to Take Your  Blood Pressure You can take your blood pressure at home with a machine. You may need to check your blood pressure at home:  To check if you have high blood pressure (hypertension).  To check your blood pressure over time.  To make sure your blood pressure medicine is working. Supplies needed: You will need a blood pressure machine, or monitor. You can buy one at a drugstore or online. When choosing one:  Choose one with an arm cuff.  Choose one that wraps around your upper arm. Only one finger should fit between your arm and the cuff.  Do not choose one that measures your blood pressure from your wrist or finger. Your doctor can suggest a monitor. How to prepare Avoid these things for 30 minutes before checking your blood pressure:  Drinking caffeine.  Drinking alcohol.  Eating.  Smoking.  Exercising. Five minutes before checking your blood pressure:  Pee.  Sit in a dining chair. Avoid sitting in a soft couch or armchair.  Be quiet. Do not talk. How to take your blood pressure Follow the instructions that came with your machine. If you have a digital blood pressure monitor, these may be the instructions: 1. Sit up straight. 2. Place your feet on the floor. Do not cross your ankles or legs. 3. Rest your left arm at the level of your heart. You may rest it on a table, desk, or chair. 4. Pull up your shirt sleeve. 5. Wrap the blood  pressure cuff around the upper part of your left arm. The cuff should be 1 inch (2.5 cm) above your elbow. It is best to wrap the cuff around bare skin. 6. Fit the cuff snugly around your arm. You should be able to place only one finger between the cuff and your arm. 7. Put the cord inside the groove of your elbow. 8. Press the power button. 9. Sit quietly while the cuff fills with air and loses air. 10. Write down the numbers on the screen. 11. Wait 2-3 minutes and then repeat steps 1-10. What do the numbers mean? Two numbers make up your  blood pressure. The first number is called systolic pressure. The second is called diastolic pressure. An example of a blood pressure reading is "120 over 80" (or 120/80). If you are an adult and do not have a medical condition, use this guide to find out if your blood pressure is normal: Normal  First number: below 120.  Second number: below 80. Elevated  First number: 120-129.  Second number: below 80. Hypertension stage 1  First number: 130-139.  Second number: 80-89. Hypertension stage 2  First number: 140 or above.  Second number: 80 or above. Your blood pressure is above normal even if only the top or bottom number is above normal. Follow these instructions at home:  Check your blood pressure as often as your doctor tells you to.  Take your monitor to your next doctor's appointment. Your doctor will: ? Make sure you are using it correctly. ? Make sure it is working right.  Make sure you understand what your blood pressure numbers should be.  Tell your doctor if your medicines are causing side effects. Contact a doctor if:  Your blood pressure keeps being high. Get help right away if:  Your first blood pressure number is higher than 180.  Your second blood pressure number is higher than 120. This information is not intended to replace advice given to you by your health care provider. Make sure you discuss any questions you have with your health care provider. Document Released: 08/12/2008 Document Revised: 08/12/2017 Document Reviewed: 02/06/2016 Elsevier Patient Education  2020 [place].  Blood Pressure Record Sheet To take your blood pressure, you will need a blood pressure machine. You can buy a blood pressure machine (blood pressure monitor) at your clinic, drug store, or online. When choosing one, consider:  An automatic monitor that has an arm cuff.  A cuff that wraps snugly around your upper arm. You should be able to fit only one finger between  your arm and the cuff.  A device that stores blood pressure reading results.  Do not choose a monitor that measures your blood pressure from your wrist or finger. Follow your health care provider's instructions for how to take your blood pressure. To use this form:  Get one reading in the morning (a.m.) before you take any medicines.  Get one reading in the evening (p.m.) before supper.  Take at least 2 readings with each blood pressure check. This makes sure the results are correct. Wait 1-2 minutes between measurements.  Write down the results in the spaces on this form.  Repeat this once a week, or as told by your health care provider.  Make a follow-up appointment with your health care provider to discuss the results. Blood pressure log Date: _______________________  a.m. _____________________(1st reading) _____________________(2nd reading)  p.m. _____________________(1st reading) _____________________(2nd reading) Date: _______________________  a.m. _____________________(1st reading) _____________________(2nd reading)  p.m. _____________________(1st reading) _____________________(2nd reading) Date: _______________________  a.m. _____________________(1st reading) _____________________(2nd reading)  p.m. _____________________(1st reading) _____________________(2nd reading) Date: _______________________  a.m. _____________________(1st reading) _____________________(2nd reading)  p.m. _____________________(1st reading) _____________________(2nd reading) Date: _______________________  a.m. _____________________(1st reading) _____________________(2nd reading)  p.m. _____________________(1st reading) _____________________(2nd reading) This information is not intended to replace advice given to you by your health care provider. Make sure you discuss any questions you have with your health care provider. Document Released: 05/29/2003 Document Revised: 10/28/2017 Document  Reviewed: 08/30/2017 Elsevier Patient Education  2020 [employer].

## 2019-04-05 ENCOUNTER — Telehealth: Payer: Self-pay | Admitting: Cardiovascular Disease

## 2019-04-05 ENCOUNTER — Inpatient Hospital Stay: Payer: Managed Care, Other (non HMO) | Admitting: Nurse Practitioner

## 2019-04-05 NOTE — Telephone Encounter (Signed)
FMLA and short term disability forms from [place] received and sent to [employer] via [employer].

## 2019-04-06 NOTE — Discharge Summary (Signed)
[facility] [place] NAME: David Carlson    MR#:  [id number]  DATE OF BIRTH:  Sep 03, 1971  DATE OF ADMISSION:  03/30/2019 ADMITTING PHYSICIAN: Sela Hua, MD  DATE OF DISCHARGE: 04/03/2019 12:19 PM  PRIMARY CARE PHYSICIAN: Patient, No Pcp Per    ADMISSION DIAGNOSIS:  Gastrointestinal hemorrhage, unspecified gastrointestinal hemorrhage type [K92.2]  DISCHARGE DIAGNOSIS:  Active Problems:   GI bleed   SECONDARY DIAGNOSIS:   Past Medical History:  Diagnosis Date  . Anxiety   . Broken shoulder   . Collar bone fracture    x 2  . Erectile dysfunction   . GERD (gastroesophageal reflux disease)   . H/O Bell's palsy   . Hyperlipidemia   . Hypertension   . Obesity   . Palpitations    a. Previously evaluted when he was incarcerated in [place] - reportedly nl stress test.  . PVC's (premature ventricular contractions)    a. 04/2013 Echo: EF 50-55%, Gr1 DD, mildly dil LA;  b. 04/2017 48hr holter: freq PVCs (5.4%), rare PACs. Single 13 beat atrial run - 143 bpm.    HOSPITAL COURSE:   GI bleed-in the setting of Xarelto use.   Patient has never had anendoscopy or colonoscopy. Hemoglobin is at baseline. -GI consult appreciated.  As patient is high risk due to low EF, suggested to manage conservatively and if bleed again then we may have to get bleeding scan.  GI suggested to start Xarelto depending on risk versus benefit for his cardiac issues and watch him in the hospital for any rebleeds. -Holding Xarelto (last dose 03/30/19 evening)-resume now 04/01/19- skip loading higher dose of Xarelto and will just start on maintenance dose. -IV PPI-  change to oral. -Serial H/H- stable Remained stable on xarelto 20 mg daily- advised to watch for any bleed in stool and follow with cardio clinic in 1 week.  Recent PE with chronic RLE DVT-recently hospitalized from 7/10-7/15with PE -Resume Xarelto maintenance dose.  We will monitor in hospital  for 2 days for any worsening in the bleed.  New diagnosis of systolic CHF- new diagnosis at last hospitalization. ECHO with EF 20-25%. Cardiac cath 7/14 with nonobstructive CAD. Appears euvoluemic on exam. -Continue Coreg, Lasix, losartan  Elevated liver enzymes- improved from last hospitalization.RUQ ultrasound7/14showed cholelithiasis and gallbladder wall thickening. Surgery recommended outpatient follow-up. -Needs to follow-up with Dr. Windell Moment as an outpatient  Hyperlipidemia- stable -Continue home lipitor   DISCHARGE CONDITIONS:   Stable.  CONSULTS OBTAINED:  Treatment Team:  Hessie Knows, MD  DRUG ALLERGIES:  No Known Allergies  DISCHARGE MEDICATIONS:   Allergies as of 04/03/2019   No Known Allergies     Medication List    STOP taking these medications   Rivaroxaban 15 & 20 MG Tbpk Replaced by: rivaroxaban 20 MG Tabs tablet     TAKE these medications   albuterol 108 (90 Base) MCG/ACT inhaler Commonly known as: VENTOLIN HFA Inhale 2 puffs into the lungs every 6 (six) hours as needed for wheezing or shortness of breath.   atorvastatin 20 MG tablet Commonly known as: LIPITOR Take 1 tablet (20 mg total) by mouth daily at 6 PM. Notes to patient: 04/03/19   carvedilol 6.25 MG tablet Commonly known as: COREG Take 1 tablet (6.25 mg total) by mouth 2 (two) times daily with a meal. Notes to patient: 04/03/19   cyclobenzaprine 10 MG tablet Commonly known as: FLEXERIL Take 10 mg by mouth 3 (three) times daily as  needed. for muscle spams   fluticasone 50 MCG/ACT nasal spray Commonly known as: FLONASE Place 2 sprays into both nostrils daily. Notes to patient: 04/04/19   furosemide 40 MG tablet Commonly known as: LASIX Take 1 tablet (40 mg total) by mouth daily. Notes to patient: 04/04/19   hydrocortisone 25 MG suppository Commonly known as: ANUSOL-HC Place 1 suppository (25 mg total) rectally 2 (two) times daily for 6 days. Notes to patient:  04/03/19   multivitamin with minerals Tabs tablet Take 1 tablet by mouth daily. Notes to patient: 04/04/19   pantoprazole 40 MG tablet Commonly known as: PROTONIX Take 1 tablet (40 mg total) by mouth daily as needed. Caution:prolonged use may cause health problems; appt needed What changed: reasons to take this Notes to patient: 04/04/19   polyethylene glycol 17 g packet Commonly known as: MIRALAX / GLYCOLAX Take 17 g by mouth daily as needed for mild constipation.   rivaroxaban 20 MG Tabs tablet Commonly known as: XARELTO Take 1 tablet (20 mg total) by mouth daily. Replaces: Rivaroxaban 15 & 20 MG Tbpk Notes to patient: 04/04/19   senna-docusate 8.6-50 MG tablet Commonly known as: Senokot-S Take 2 tablets by mouth daily as needed for mild constipation.        DISCHARGE INSTRUCTIONS:    Follow with cardiology in 1-2 weeks.  If you experience worsening of your admission symptoms, develop shortness of breath, life threatening emergency, suicidal or homicidal thoughts you must seek medical attention immediately by calling 911 or calling your MD immediately  if symptoms less severe.  You Must read complete instructions/literature along with all the possible adverse reactions/side effects for all the Medicines you take and that have been prescribed to you. Take any new Medicines after you have completely understood and accept all the possible adverse reactions/side effects.   Please note  You were cared for by a hospitalist during your hospital stay. If you have any questions about your discharge medications or the care you received while you were in the hospital after you are discharged, you can call the unit and asked to speak with the hospitalist on call if the hospitalist that took care of you is not available. Once you are discharged, your primary care physician will handle any further medical issues. Please note that NO REFILLS for any discharge medications will be authorized  once you are discharged, as it is imperative that you return to your primary care physician (or establish a relationship with a primary care physician if you do not have one) for your aftercare needs so that they can reassess your need for medications and monitor your lab values.    Today   CHIEF COMPLAINT:   Chief Complaint  Patient presents with  . GI Bleeding    HISTORY OF PRESENT ILLNESS:  David Carlson  is a 48 y.o. male with a known history of recent PE on Xarelto, hypertension, hyperlipidemia, GERD who presented to the ED with bloody stool.  Patient had 2 bloody bowel movements today.  He states that whenever he sits on the toilet, blood pours out of his bottom.  He denies any abdominal pain, nausea, vomiting. He denies any chest pain, palpitations, lightheadedness, dizziness.  He denies any bleeding from other sites.  No NSAID use.  In the ED, he was mildly tachycardic, with heart rates in the low 100s.  Labs were significant for AST 51, ALT 95, total bilirubin 1.7, hemoglobin 12.4.  Hospitalists were called for admission.   VITAL SIGNS:  Blood pressure 115/82, pulse 92, temperature 97.8 F (36.6 C), temperature source Oral, resp. rate 15, height 5\' 10"  (1.778 m), weight 89.3 kg, SpO2 99 %.  I/O:  No intake or output data in the 24 hours ending 04/06/19 0902  PHYSICAL EXAMINATION:  GENERAL:  48 y.o.-year-old patient lying in the bed with no acute distress.  EYES: Pupils equal, round, reactive to light and accommodation. No scleral icterus. Extraocular muscles intact.  HEENT: Head atraumatic, normocephalic. Oropharynx and nasopharynx clear.  NECK:  Supple, no jugular venous distention. No thyroid enlargement, no tenderness.  LUNGS: Normal breath sounds bilaterally, no wheezing, rales,rhonchi or crepitation. No use of accessory muscles of respiration.  CARDIOVASCULAR: S1, S2 normal. No murmurs, rubs, or gallops.  ABDOMEN: Soft, non-tender, non-distended. Bowel sounds  present. No organomegaly or mass.  EXTREMITIES: No pedal edema, cyanosis, or clubbing.  NEUROLOGIC: Cranial nerves II through XII are intact. Muscle strength 5/5 in all extremities. Sensation intact. Gait not checked.  PSYCHIATRIC: The patient is alert and oriented x 3.  SKIN: No obvious rash, lesion, or ulcer.   DATA REVIEW:   CBC Recent Labs  Lab 04/03/19 0331  WBC 6.5  HGB 12.4*  HCT 40.1  PLT 328    Chemistries  Recent Labs  Lab 04/02/19 0440  NA 136  K 4.0  CL 102  CO2 25  GLUCOSE 95  BUN 21*  CREATININE 0.77  CALCIUM 8.3*  AST 33  ALT 59*  ALKPHOS 85  BILITOT 1.1    Cardiac Enzymes No results for input(s): TROPONINI in the last 168 hours.  Microbiology Results  Results for orders placed or performed during the hospital encounter of 03/30/19  SARS Coronavirus 2 (CEPHEID - Performed in [facility] hospital lab), Hosp Order     Status: None   Collection Time: 03/30/19  8:54 PM   Specimen: Nasopharyngeal Swab  Result Value Ref Range Status   SARS Coronavirus 2 NEGATIVE NEGATIVE Final    Comment: (NOTE) If result is NEGATIVE SARS-CoV-2 target nucleic acids are NOT DETECTED. The SARS-CoV-2 RNA is generally detectable in upper and lower  respiratory specimens during the acute phase of infection. The lowest  concentration of SARS-CoV-2 viral copies this assay can detect is 250  copies / mL. A negative result does not preclude SARS-CoV-2 infection  and should not be used as the sole basis for treatment or other  patient management decisions.  A negative result may occur with  improper specimen collection / handling, submission of specimen other  than nasopharyngeal swab, presence of viral mutation(s) within the  areas targeted by this assay, and inadequate number of viral copies  (<250 copies / mL). A negative result must be combined with clinical  observations, patient history, and epidemiological information. If result is POSITIVE SARS-CoV-2 target nucleic  acids are DETECTED. The SARS-CoV-2 RNA is generally detectable in upper and lower  respiratory specimens dur ing the acute phase of infection.  Positive  results are indicative of active infection with SARS-CoV-2.  Clinical  correlation with patient history and other diagnostic information is  necessary to determine patient infection status.  Positive results do  not rule out bacterial infection or co-infection with other viruses. If result is PRESUMPTIVE POSTIVE SARS-CoV-2 nucleic acids MAY BE PRESENT.   A presumptive positive result was obtained on the submitted specimen  and confirmed on repeat testing.  While 2019 novel coronavirus  (SARS-CoV-2) nucleic acids may be present in the submitted sample  additional confirmatory testing may be necessary  for epidemiological  and / or clinical management purposes  to differentiate between  SARS-CoV-2 and other Sarbecovirus currently known to infect humans.  If clinically indicated additional testing with an alternate test  methodology [phone]) is advised. The SARS-CoV-2 RNA is generally  detectable in upper and lower respiratory sp ecimens during the acute  phase of infection. The expected result is Negative. Fact Sheet for Patients:  StrictlyIdeas.no Fact Sheet for Healthcare Providers: BankingDealers.co.za This test is not yet approved or cleared by the [place] FDA and has been authorized for detection and/or diagnosis of SARS-CoV-2 by FDA under an Emergency Use Authorization (EUA).  This EUA will remain in effect (meaning this test can be used) for the duration of the COVID-19 declaration under Section 564(b)(1) of the Act, 21 U.S.C. section 360bbb-3(b)(1), unless the authorization is terminated or revoked sooner. Performed at [facility], [address], [place], [place] [address]     RADIOLOGY:  No results found.  EKG:   Orders placed or performed in visit on  03/23/19  . EKG 12-Lead      Management plans discussed with the patient, family and they are in agreement.  CODE STATUS:  Code Status History    Date Active Date Inactive Code Status Order ID Comments User Context   03/30/2019 2323 04/03/2019 1519 Full Code [id number]  Sela Hua, MD Inpatient   03/23/2019 1526 03/28/2019 1858 Full Code [id number]  Loletha Grayer, MD Inpatient   Advance Care Planning Activity      TOTAL TIME TAKING CARE OF THIS PATIENT: 35 minutes.    [name] M.D on 04/06/2019 at 9:02 AM  Between 7am to 6pm - Pager - [phone]  After 6pm go to [email or web address] - password [email or web address] [facility]  Office  [phone]  CC: Primary care physician; Patient, No Pcp Per   Note: This dictation was prepared with Dragon dictation along with smaller phrase technology. Any transcriptional errors that result from this process are unintentional.

## 2019-04-11 ENCOUNTER — Inpatient Hospital Stay: Payer: Managed Care, Other (non HMO)

## 2019-04-11 ENCOUNTER — Inpatient Hospital Stay: Payer: Managed Care, Other (non HMO) | Attending: Oncology | Admitting: Oncology

## 2019-04-11 ENCOUNTER — Other Ambulatory Visit: Payer: Self-pay

## 2019-04-11 ENCOUNTER — Encounter: Payer: Self-pay | Admitting: Oncology

## 2019-04-11 VITALS — BP 124/82 | HR 84 | Temp 96.2°F | Resp 18 | Wt 198.7 lb

## 2019-04-11 DIAGNOSIS — I82551 Chronic embolism and thrombosis of right peroneal vein: Secondary | ICD-10-CM

## 2019-04-11 DIAGNOSIS — E785 Hyperlipidemia, unspecified: Secondary | ICD-10-CM | POA: Diagnosis not present

## 2019-04-11 DIAGNOSIS — D6852 Prothrombin gene mutation: Secondary | ICD-10-CM | POA: Insufficient documentation

## 2019-04-11 DIAGNOSIS — I110 Hypertensive heart disease with heart failure: Secondary | ICD-10-CM

## 2019-04-11 DIAGNOSIS — Z7901 Long term (current) use of anticoagulants: Secondary | ICD-10-CM | POA: Insufficient documentation

## 2019-04-11 DIAGNOSIS — K219 Gastro-esophageal reflux disease without esophagitis: Secondary | ICD-10-CM | POA: Diagnosis not present

## 2019-04-11 DIAGNOSIS — I2609 Other pulmonary embolism with acute cor pulmonale: Secondary | ICD-10-CM | POA: Diagnosis present

## 2019-04-11 DIAGNOSIS — R748 Abnormal levels of other serum enzymes: Secondary | ICD-10-CM | POA: Insufficient documentation

## 2019-04-11 DIAGNOSIS — I5022 Chronic systolic (congestive) heart failure: Secondary | ICD-10-CM | POA: Diagnosis not present

## 2019-04-11 DIAGNOSIS — K921 Melena: Secondary | ICD-10-CM | POA: Diagnosis not present

## 2019-04-11 DIAGNOSIS — R5383 Other fatigue: Secondary | ICD-10-CM | POA: Insufficient documentation

## 2019-04-11 DIAGNOSIS — Z87891 Personal history of nicotine dependence: Secondary | ICD-10-CM | POA: Insufficient documentation

## 2019-04-11 DIAGNOSIS — R0602 Shortness of breath: Secondary | ICD-10-CM | POA: Diagnosis not present

## 2019-04-11 DIAGNOSIS — R600 Localized edema: Secondary | ICD-10-CM | POA: Insufficient documentation

## 2019-04-11 DIAGNOSIS — Z79899 Other long term (current) drug therapy: Secondary | ICD-10-CM | POA: Diagnosis not present

## 2019-04-11 LAB — COMPREHENSIVE METABOLIC PANEL
ALT: 38 U/L (ref 0–44)
AST: 26 U/L (ref 15–41)
Albumin: 3.8 g/dL (ref 3.5–5.0)
Alkaline Phosphatase: 85 U/L (ref 38–126)
Anion gap: 3 — ABNORMAL LOW (ref 5–15)
BUN: 20 mg/dL (ref 6–20)
CO2: 28 mmol/L (ref 22–32)
Calcium: 9 mg/dL (ref 8.9–10.3)
Chloride: 108 mmol/L (ref 98–111)
Creatinine, Ser: 0.75 mg/dL (ref 0.61–1.24)
GFR calc Af Amer: >60 mL/min (ref >60)
GFR calc non Af Amer: >60 mL/min (ref >60)
Glucose, Bld: 96 mg/dL (ref 70–99)
Potassium: 4.4 mmol/L (ref 3.5–5.1)
Sodium: 139 mmol/L (ref 135–145)
Total Bilirubin: 0.9 mg/dL (ref 0.3–1.2)
Total Protein: 8.4 g/dL — ABNORMAL HIGH (ref 6.5–8.1)

## 2019-04-11 LAB — CBC WITH DIFFERENTIAL/PLATELET
Abs Immature Granulocytes: 0.02 10*3/uL (ref 0.00–0.07)
Basophils Absolute: 0.1 10*3/uL (ref 0.0–0.1)
Basophils Relative: 1 %
Eosinophils Absolute: 0.2 10*3/uL (ref 0.0–0.5)
Eosinophils Relative: 2 %
HCT: 44.8 % (ref 39.0–52.0)
Hemoglobin: 13.5 g/dL (ref 13.0–17.0)
Immature Granulocytes: 0 %
Lymphocytes Relative: 27 %
Lymphs Abs: 1.9 10*3/uL (ref 0.7–4.0)
MCH: 26.3 pg (ref 26.0–34.0)
MCHC: 30.1 g/dL (ref 30.0–36.0)
MCV: 87.3 fL (ref 80.0–100.0)
Monocytes Absolute: 0.6 10*3/uL (ref 0.1–1.0)
Monocytes Relative: 9 %
Neutro Abs: 4.3 10*3/uL (ref 1.7–7.7)
Neutrophils Relative %: 61 %
Platelets: 377 10*3/uL (ref 150–400)
RBC: 5.13 MIL/uL (ref 4.22–5.81)
RDW: 15.6 % — ABNORMAL HIGH (ref 11.5–15.5)
WBC: 7.1 10*3/uL (ref 4.0–10.5)
nRBC: 0 % (ref 0.0–0.2)

## 2019-04-11 NOTE — Progress Notes (Signed)
Hematology/Oncology Consult note [facility] [phone] [phone]   Patient Care Team: Patient, No Pcp Per as PCP - General ([place]) Minna Merritts, MD as PCP - Cardiology (Cardiology)  REFERRING PROVIDER: Demetrios Loll, MD  CHIEF COMPLAINTS/REASON FOR VISIT:  Evaluation of bilateral pulmonary embolism.  HISTORY OF PRESENTING ILLNESS:   David Carlson is a  48 y.o.  male with PMH listed below was seen in consultation at the request of  Demetrios Loll, MD  for evaluation of bilateral pulmonary embolism. Patient was admitted from 03/22/2021 03/28/2019 due to acute bilateral pulmonary embolism. Patient is initially presented to emergency room complaining shortness of breath, ongoing for the past 6 months.  Also associated with left-sided chest pain. 03/23/2019 CT angiogram chest PE protocol was independently reviewed by me. Images revealed multifocal occlusive segmental to subsegmental pulmonary embolism bilaterally, pulmonary infarction, pleural effusion and a diffuse bilateral interlobular septal thickening likely edema.  Cardiomegaly. 03/23/2019 [place] lower extremity venous Doppler showed chronic DVT involving right peroneal veins. Hypercoagulable work-up was sent off. Patient was started on heparin drip and is switched to oral Xarelto at discharge. Patient has CT evidence of heart strain.  Evaluated by cardiology and also had left heart cath with findings of mild nonobstructive coronary artery disease.  Findings are consistent with nonischemic cardiomyopathy.  Severely elevated left ventricular filling pressure.  2D echo showed acute heart failure 20 to 25% ejection fraction.  Patient takes Coreg 6.25 twice daily, Lasix 40 mg daily Entresto 24/26 mg, David Carlson had a virtual visit with Dr. Rockey Situ on 04/04/2019.   Patient was discharged on 03/28/2019 and presented back to emergency room on 03/30/2019 after having 2 bloody bowel movements.  Patient  was seen by gastroenterology Dr. Bonna Gains, given his recent CHF, bilateral PE/high risk of endoscopy procedures, suggest to manage conservatively..  Patient's Xarelto 20 mg daily was held for a few days and restarted.  Patient reports that no additional bleeding events.  Elevated liver enzymes- improved from last hospitalization.RUQ ultrasound7/14showed cholelithiasis and gallbladder wall thickening. Surgery recommended outpatient follow-up.  Today David Carlson reports tolerating anticoagulation with Xarelto well.  On 20 mg daily.Denies hematochezia, hematuria, hematemesis, epistaxis, black tarry stool or easy bruising.  Reports breathing is better.still has sob with exertion. Fatigue  Denies any chest pain today.    Review of Systems  Constitutional: Positive for fatigue. Negative for appetite change, chills, fever and unexpected weight change.  HENT:   Negative for hearing loss and voice change.   Eyes: Negative for eye problems and icterus.  Respiratory: Positive for shortness of breath. Negative for chest tightness and cough.   Cardiovascular: Negative for chest pain and leg swelling.  Gastrointestinal: Negative for abdominal distention and abdominal pain.  Endocrine: Negative for hot flashes.  Genitourinary: Negative for difficulty urinating, dysuria and frequency.   Musculoskeletal: Negative for arthralgias.  Skin: Negative for itching and rash.  Neurological: Negative for light-headedness and numbness.  Hematological: Negative for adenopathy. Does not bruise/bleed easily.  Psychiatric/Behavioral: Negative for confusion.    MEDICAL HISTORY:  Past Medical History:  Diagnosis Date   Anxiety    Broken shoulder    Collar bone fracture    x 2   Erectile dysfunction    GERD (gastroesophageal reflux disease)    Gout    H/O Bell's palsy    Hyperlipidemia    Hypertension    Obesity    Palpitations    a. Previously evaluted when David Carlson was incarcerated in [place] - reportedly  nl  stress test.   PVC's (premature ventricular contractions)    a. 04/2013 Echo: EF 50-55%, Gr1 DD, mildly dil LA;  b. 04/2017 48hr holter: freq PVCs (5.4%), rare PACs. Single 13 beat atrial run - 143 bpm.    SURGICAL HISTORY: Past Surgical History:  Procedure Laterality Date   CARPAL TUNNEL RELEASE Bilateral    HERNIA REPAIR  2015   HIP SURGERY     a. Age 14 - failure of growth plate to close - Pins inserted   LEFT HEART CATH AND CORONARY ANGIOGRAPHY N/A 03/27/2019   Procedure: LEFT HEART CATH AND CORONARY ANGIOGRAPHY;  Surgeon: Nelva Bush, MD;  Location: [facility];  Service: Cardiovascular;  Laterality: N/A;    SOCIAL HISTORY: Social History   Socioeconomic History   Marital status: Single    Spouse name: Not on file   Number of children: 2   Years of education: 12   Highest education level: High school graduate  Occupational History   Not on file  Social Needs   Financial resource strain: Not hard at all   Food insecurity    Worry: Never true    Inability: Never true   Transportation needs    Medical: No    Non-medical: Not on file  Tobacco Use   Smoking status: Former Smoker    Packs/day: 1.00    Years: 20.00    Pack years: 20.00    Types: Cigarettes    Quit date: 09/15/2015    Years since quitting: 3.5   Smokeless tobacco: Never Used  Substance and Sexual Activity   Alcohol use: Yes    Alcohol/week: 0.0 standard drinks    Comment: 3-6 cans/beer on weeknights, 6-12 beers on weekends.   Drug use: No   Sexual activity: Yes  Lifestyle   Physical activity    Days per week: 0 days    Minutes per session: 0 min   Stress: Not at all  Relationships   Social connections    Talks on phone: More than three times a week    Gets together: Once a week    Attends religious service: Never    Active member of club or organization: No    Attends meetings of clubs or organizations: Never    Relationship status: Divorced   Intimate partner  violence    Fear of current or ex partner: Patient refused    Emotionally abused: Patient refused    Physically abused: Patient refused    Forced sexual activity: Patient refused  Other Topics Concern   Not on file  Social History Narrative   Lives in [place] with his ex-girlfriend's brother.  Works @ [employer] in [employer].  Active @ work w/o limitations but does not routinely exercise.    FAMILY HISTORY: Family History  Problem Relation Age of Onset   Diabetes Mother    Hypertension Father    Heart disease Maternal Grandmother    Heart attack Maternal Grandmother    Diabetes Maternal Grandfather    Diabetes Paternal Grandfather     ALLERGIES:  has No Known Allergies.  MEDICATIONS:  Current Outpatient Medications  Medication Sig Dispense Refill   albuterol (VENTOLIN HFA) 108 (90 Base) MCG/ACT inhaler Inhale 2 puffs into the lungs every 6 (six) hours as needed for wheezing or shortness of breath. 1 Inhaler 0   atorvastatin (LIPITOR) 20 MG tablet Take 1 tablet (20 mg total) by mouth daily at 6 PM. 90 tablet 0   carvedilol (COREG) 6.25  MG tablet Take 1 tablet (6.25 mg total) by mouth 2 (two) times daily with a meal. 60 tablet 1   colchicine 0.6 MG tablet Take 0.6 mg by mouth daily.     fluticasone (FLONASE) 50 MCG/ACT nasal spray Place 2 sprays into both nostrils daily. 16 g 6   furosemide (LASIX) 40 MG tablet Take 1 tablet (40 mg total) by mouth daily. 30 tablet 1   Multiple Vitamin (MULTIVITAMIN WITH MINERALS) TABS tablet Take 1 tablet by mouth daily.     pantoprazole (PROTONIX) 40 MG tablet Take 1 tablet (40 mg total) by mouth daily as needed. Caution:prolonged use may cause health problems; appt needed (Patient taking differently: Take 40 mg by mouth daily as needed (GERD symptoms). Caution:prolonged use may cause health problems; appt needed) 90 tablet 1   polyethylene glycol (MIRALAX / GLYCOLAX) 17 g packet Take 17 g by mouth daily as needed for  mild constipation. 14 each 0   rivaroxaban (XARELTO) 20 MG TABS tablet Take 1 tablet (20 mg total) by mouth daily. 30 tablet 1   sacubitril-valsartan (ENTRESTO) 24-26 MG Take 1 tablet by mouth 2 (two) times daily. 60 tablet 6   cyclobenzaprine (FLEXERIL) 10 MG tablet Take 10 mg by mouth 3 (three) times daily as needed. for muscle spams     senna-docusate (SENOKOT-S) 8.6-50 MG tablet Take 2 tablets by mouth daily as needed for mild constipation. (Patient not taking: Reported on 04/11/2019) 30 tablet 1   No current facility-administered medications for this visit.      PHYSICAL EXAMINATION: ECOG PERFORMANCE STATUS: 1 - Symptomatic but completely ambulatory Vitals:   04/11/19 1007  BP: 124/82  Pulse: 84  Resp: 18  Temp: (!) 96.2 F (35.7 C)  SpO2: 98%   Filed Weights   04/11/19 1007  Weight: 198 lb 11.2 oz (90.1 kg)    Physical Exam Constitutional:      General: David Carlson is not in acute distress. HENT:     Head: Normocephalic and atraumatic.  Eyes:     General: No scleral icterus.    Pupils: Pupils are equal, round, and reactive to light.  Neck:     Musculoskeletal: Normal range of motion and neck supple.  Cardiovascular:     Rate and Rhythm: Normal rate and regular rhythm.     Heart sounds: Normal heart sounds.  Pulmonary:     Effort: Pulmonary effort is normal. No respiratory distress.     Breath sounds: No wheezing.  Abdominal:     General: Bowel sounds are normal. There is no distension.     Palpations: Abdomen is soft. There is no mass.     Tenderness: There is no abdominal tenderness.  Musculoskeletal: Normal range of motion.        General: No deformity.     Comments: Bilateral lower extremities 1+ edema.   Skin:    General: Skin is warm and dry.     Findings: No erythema or rash.     Comments: Tattoo.   Neurological:     Mental Status: David Carlson is alert and oriented to person, place, and time.     Cranial Nerves: No cranial nerve deficit.     Coordination:  Coordination normal.  Psychiatric:        Behavior: Behavior normal.        Thought Content: Thought content normal.     LABORATORY DATA:  I have reviewed the data as listed Lab Results  Component Value Date   WBC 7.1 04/11/2019  HGB 13.5 04/11/2019   HCT 44.8 04/11/2019   MCV 87.3 04/11/2019   PLT 377 04/11/2019   Recent Labs    03/27/19 1736  03/28/19 0700 03/30/19 1930 03/31/19 0420 04/02/19 0440 04/11/19 1054  NA  --    < >  --  139 139 136 139  K  --    < >  --  3.5 4.3 4.0 4.4  CL  --    < >  --  105 106 102 108  CO2  --    < >  --  24 24 25 28   GLUCOSE  --    < >  --  141* 94 95 96  BUN  --    < >  --  13 14 21* 20  CREATININE  --    < >  --  0.92 0.96 0.77 0.75  CALCIUM  --    < >  --  8.4* 8.1* 8.3* 9.0  GFRNONAA  --    < >  --  >60 >60 >60 >60  GFRAA  --    < >  --  >60 >60 >60 >60  PROT 6.0*  --  5.9* 7.2  --  6.8 8.4*  ALBUMIN 2.9*  --  2.7* 3.4*  --  3.2* 3.8  AST 135*  --  103* 51*  --  33 26  ALT 163*  --  141* 95*  --  59* 38  ALKPHOS 75  --  79 96  --  85 85  BILITOT 2.1*  --  2.0* 1.7*  --  1.1 0.9  BILIDIR 1.0*  --  0.9*  --   --   --   --   IBILI 1.1*  --  1.1*  --   --   --   --    < > = values in this interval not displayed.   Iron/TIBC/Ferritin/ %Sat No results found for: IRON, TIBC, FERRITIN, IRONPCTSAT    RADIOGRAPHIC STUDIES: I have personally reviewed the radiological images as listed and agreed with the findings in the report.  Dg Knee 1-2 Views Left  Result Date: 03/31/2019 CLINICAL DATA:  LEFT knee pain starting yesterday.  No known injury. EXAM: LEFT KNEE - 1-2 VIEW COMPARISON:  None. FINDINGS: No evidence of fracture, dislocation, or joint effusion. No evidence of arthropathy or other focal bone abnormality. Soft tissues are unremarkable. IMPRESSION: Normal plain film of the LEFT knee. Electronically Signed   By: Franki Cabot M.D.   On: 03/31/2019 11:53   Ct Angio Chest Pe W Or Wo Contrast  Addendum Date: 03/23/2019     ADDENDUM REPORT: 03/23/2019 12:20 ADDENDUM: These results were called by telephone at the time of interpretation on 03/23/2019 at 12:11 pm to Dr. Murray Hodgkins , who verbally acknowledged these results. Electronically Signed   By: Eddie Candle M.D.   On: 03/23/2019 12:20   Result Date: 03/23/2019 CLINICAL DATA:  Shortness of breath, tachycardia EXAM: CT ANGIOGRAPHY CHEST WITH CONTRAST TECHNIQUE: Multidetector CT imaging of the chest was performed using the standard protocol during bolus administration of intravenous contrast. Multiplanar CT image reconstructions and MIPs were obtained to evaluate the vascular anatomy. CONTRAST:  69mL OMNIPAQUE IOHEXOL 350 MG/ML SOLN COMPARISON:  None. FINDINGS: Cardiovascular: Satisfactory opacification of the pulmonary arteries to the segmental level. Positive examination for pulmonary embolism with multifocal occlusive segmental to subsegmental embolus present bilaterally, most notably in the bilateral lower lobes (series 5, image 145). Cardiomegaly, with enlarged right ventricle  although preserved RV LV ratio, less than 0.8. No pericardial effusion. Mediastinum/Nodes: No enlarged mediastinal, hilar, or axillary lymph nodes. Thyroid gland, trachea, and esophagus demonstrate no significant findings. Lungs/Pleura: Small bilateral pleural effusions and mild, diffuse bilateral interlobular septal thickening. There is ground-glass and consolidative opacity of left lung base and to a lesser extent the lateral lateral segment right middle lobe and lateral segment right lower lobe, distal to embolus and a concerning for developing pulmonary infarction (series 6, image 59). Upper Abdomen: No acute abnormality. Musculoskeletal: No chest wall abnormality. No acute or significant osseous findings. Review of the MIP images confirms the above findings. IMPRESSION: 1. Positive examination for pulmonary embolism with multifocal occlusive segmental to subsegmental embolus present  bilaterally, most notably in the bilateral lower lobes (series 5, image 145). 2. There is ground-glass and consolidative opacity of left lung base and to a lesser extent the lateral lateral segment right middle lobe and lateral segment right lower lobe, distal to embolus and a concerning for developing pulmonary infarction (series 6, image 59). 3. Pleural effusions and diffuse bilateral interlobular septal thickening, likely edema. 4. Cardiomegaly, with enlarged right ventricle although preserved RV LV ratio, less than 0.8. Correlate with clinical and echocardiographic evidence of right heart strain. These results were called by telephone at the time of interpretation, final communication will be documented in addendum. Electronically Signed: By: Eddie Candle M.D. On: 03/23/2019 12:06   Mr 3d Recon At Scanner  Result Date: 03/28/2019 CLINICAL DATA:  Gallbladder wall thickening on ultrasound. Gallstones. Elevated bilirubin. Acute pulmonary emboli EXAM: MRI ABDOMEN WITHOUT AND WITH CONTRAST (INCLUDING MRCP) TECHNIQUE: Multiplanar multisequence MR imaging of the abdomen was performed both before and after the administration of intravenous contrast. Heavily T2-weighted images of the biliary and pancreatic ducts were obtained, and three-dimensional MRCP images were rendered by post processing. CONTRAST:  9 mL Gadavist COMPARISON:  CT 03/23/2019, ultrasound 03/27/2019 FINDINGS: Lower chest:  RIGHT pleural effusion. Hepatobiliary: The gallbladder is collapsed. There is mild gallbladder wall thickening related to the collapsed gallbladder. No pericholecystic fluid. The common bile duct is normal caliber. No choledocholithiasis. No intrahepatic biliary duct dilatation. No focal hepatic lesion.  No hepatic steatosis. Pancreas: No pancreatic inflammation. No variant pancreatic ductal anatomy. Spleen: Normal spleen. Adrenals/urinary tract: Adrenal glands and kidneys are normal. Stomach/Bowel: Stomach and limited of the  small bowel is unremarkable Vascular/Lymphatic: Abdominal aortic normal caliber. No retroperitoneal periportal lymphadenopathy. Musculoskeletal: No aggressive osseous lesion IMPRESSION: 1. Gallbladder collapsed.  No evidence of acute cholecystitis. 2. No biliary duct dilatation. Common bile duct normal. No choledocholithiasis. 3. Normal liver parenchyma.  No hepatic steatosis. 4. Normal pancreas without evidence pancreatitis or ductal variation. Electronically Signed   By: Suzy Bouchard M.D.   On: 03/28/2019 09:15   Mr Abdomen Mrcp Moise Boring Contast  Result Date: 03/28/2019 CLINICAL DATA:  Gallbladder wall thickening on ultrasound. Gallstones. Elevated bilirubin. Acute pulmonary emboli EXAM: MRI ABDOMEN WITHOUT AND WITH CONTRAST (INCLUDING MRCP) TECHNIQUE: Multiplanar multisequence MR imaging of the abdomen was performed both before and after the administration of intravenous contrast. Heavily T2-weighted images of the biliary and pancreatic ducts were obtained, and three-dimensional MRCP images were rendered by post processing. CONTRAST:  9 mL Gadavist COMPARISON:  CT 03/23/2019, ultrasound 03/27/2019 FINDINGS: Lower chest:  RIGHT pleural effusion. Hepatobiliary: The gallbladder is collapsed. There is mild gallbladder wall thickening related to the collapsed gallbladder. No pericholecystic fluid. The common bile duct is normal caliber. No choledocholithiasis. No intrahepatic biliary duct dilatation. No focal hepatic lesion.  No hepatic steatosis. Pancreas: No pancreatic inflammation. No variant pancreatic ductal anatomy. Spleen: Normal spleen. Adrenals/urinary tract: Adrenal glands and kidneys are normal. Stomach/Bowel: Stomach and limited of the small bowel is unremarkable Vascular/Lymphatic: Abdominal aortic normal caliber. No retroperitoneal periportal lymphadenopathy. Musculoskeletal: No aggressive osseous lesion IMPRESSION: 1. Gallbladder collapsed.  No evidence of acute cholecystitis. 2. No biliary duct  dilatation. Common bile duct normal. No choledocholithiasis. 3. Normal liver parenchyma.  No hepatic steatosis. 4. Normal pancreas without evidence pancreatitis or ductal variation. Electronically Signed   By: Suzy Bouchard M.D.   On: 03/28/2019 09:15   Vas [place] Lower Extremity Venous (dvt)  Result Date: 03/24/2019  Lower Venous Study Other Indications: Swelling and mild tenderness of the left calf for 6 weeks.                    Dyspnea for 6 months, progressing 6 weeks ago. Risk Factors: None identified. Comparison Study: None Performing Technologist: Pilar Jarvis RDMS, RVT, RDCS  Examination Guidelines: A complete evaluation includes B-mode imaging, spectral Doppler, color Doppler, and power Doppler as needed of all accessible portions of each vessel. Bilateral testing is considered an integral part of a complete examination. Limited examinations for reoccurring indications may be performed as noted.  +---------+---------------+---------+-----------+----------+-------+  RIGHT     Compressibility Phasicity Spontaneity Properties Summary  +---------+---------------+---------+-----------+----------+-------+  CFV       Full            Yes       Yes                             +---------+---------------+---------+-----------+----------+-------+  SFJ       Full            Yes       Yes                             +---------+---------------+---------+-----------+----------+-------+  FV Prox   Full            Yes       Yes                             +---------+---------------+---------+-----------+----------+-------+  FV Mid    Full            Yes       Yes                             +---------+---------------+---------+-----------+----------+-------+  FV Distal Full            Yes       Yes                             +---------+---------------+---------+-----------+----------+-------+  PFV       Full            Yes       Yes                              +---------+---------------+---------+-----------+----------+-------+  POP       Full            Yes       Yes                             +---------+---------------+---------+-----------+----------+-------+  PTV       Full            Yes       Yes                             +---------+---------------+---------+-----------+----------+-------+  PERO      None            No        No          retracted  Chronic  +---------+---------------+---------+-----------+----------+-------+  Soleal    Full            Yes       Yes                             +---------+---------------+---------+-----------+----------+-------+  Gastroc   Full            Yes       Yes                             +---------+---------------+---------+-----------+----------+-------+  GSV       Full            Yes       Yes                             +---------+---------------+---------+-----------+----------+-------+  SSV       Full            Yes       Yes                             +---------+---------------+---------+-----------+----------+-------+ Both peroneal veins appear diminutive and atrophied as opposed to dilated. Both veins contain highly echogenic material.  +---------+---------------+---------+-----------+----------+-------+  LEFT      Compressibility Phasicity Spontaneity Properties Summary  +---------+---------------+---------+-----------+----------+-------+  CFV       Full            Yes       Yes                             +---------+---------------+---------+-----------+----------+-------+  SFJ       Full            Yes       Yes                             +---------+---------------+---------+-----------+----------+-------+  FV Prox   Full            Yes       Yes                             +---------+---------------+---------+-----------+----------+-------+  FV Mid    Full            Yes       Yes                             +---------+---------------+---------+-----------+----------+-------+  FV Distal Full            Yes        Yes                             +---------+---------------+---------+-----------+----------+-------+  PFV       Full            Yes       Yes                             +---------+---------------+---------+-----------+----------+-------+  POP       Full            Yes       Yes                             +---------+---------------+---------+-----------+----------+-------+  PTV       Full            Yes       Yes                             +---------+---------------+---------+-----------+----------+-------+  PERO      Full            Yes       Yes                             +---------+---------------+---------+-----------+----------+-------+  Soleal    Full            Yes       Yes                             +---------+---------------+---------+-----------+----------+-------+  Gastroc   Full            Yes       Yes                             +---------+---------------+---------+-----------+----------+-------+  GSV       Full            Yes       Yes                             +---------+---------------+---------+-----------+----------+-------+  SSV       Full            Yes       Yes                             +---------+---------------+---------+-----------+----------+-------+   Summary: Right: No reflux was noted in the common femoral vein. Findings consistent with chronic deep vein thrombosis involving the right peroneal veins. All other veins visualized appear fully compressible and demonstrate appropriate Doppler characteristics.  Left: No evidence of deep vein thrombosis in the lower extremity. No indirect evidence of obstruction proximal to the inguinal ligament.  *See table(s) above for measurements and observations. Electronically signed by Ida Rogue MD on 03/24/2019 at 8:58:58 AM.    Final    US Abdomen Limited Ruq  Result Date: 03/27/2019 CLINICAL DATA:  Elevated LFTs. EXAM: ULTRASOUND ABDOMEN LIMITED RIGHT UPPER QUADRANT COMPARISON:  None. FINDINGS: Gallbladder: Several small stones  are seen in the gallbladder in addition to wall thickening measuring up to 5.6 mm. No pericholecystic fluid, sludge, or Murphy's sign. Common bile duct: Diameter: 1.5 mm Liver: No focal mass. Portal vein is patent on color Doppler  imaging with normal direction of blood flow towards the liver. IMPRESSION: 1. There are several small stones in the gallbladder with gallbladder wall thickening measuring 5.6 mm. No sludge, pericholecystic fluid, or Murphy's sign. If there is continued concern for acute cholecystitis, recommend a HIDA scan. Electronically Signed   By: Dorise Bullion III M.D   On: 03/27/2019 08:18      ASSESSMENT & PLAN:  1. Other acute pulmonary embolism with acute cor pulmonale ([place])   2. Chronic deep vein thrombosis (DVT) of right peroneal vein   3. Hematochezia   4. Chronic systolic congestive heart failure (HCC)    Unprovoked acute bilateral multifocal pulmonary embolism, chronic lower extremity DVT. Agrees with long-term anticoagulation. Check CBC CMP.  Prothrombin gene mutation and factor V Leiden mutation status. Hold other hypercoagulable work-up due to the acute phase of thrombosis and results likely not going to change long-term anticoagulation management plan. Continue Xarelto 20 mg daily, recommended uninterrupted anticoagulation for 6 months.  Hematochezia, no additional episodes.  Hemoglobin stable.  Follow-up with gastroenterology.  After David Carlson finished 6 months of therapy, patient may hold anticoagulation temporarily prior to proceeding to endoscopy.  CHF, follow up with cardiology. On lasix, Entresto and Coreg.   Orders Placed This Encounter  Procedures   Prothrombin gene mutation    Standing Status:   Future    Number of Occurrences:   1    Standing Expiration Date:   04/10/2020   Factor 5 leiden    Standing Status:   Future    Number of Occurrences:   1    Standing Expiration Date:   04/10/2020   CBC with Differential/Platelet    Standing Status:   Future      Number of Occurrences:   1    Standing Expiration Date:   04/10/2020   Comprehensive metabolic panel    Standing Status:   Future    Number of Occurrences:   1    Standing Expiration Date:   04/10/2020   CBC with Differential/Platelet    Standing Status:   Future    Standing Expiration Date:   04/10/2020   Comprehensive metabolic panel    Standing Status:   Future    Standing Expiration Date:   04/10/2020    All questions were answered. The patient knows to call the clinic with any problems questions or concerns.  cc Demetrios Loll, MD    Return of visit: 10 weeks.  Thank you for this kind referral and the opportunity to participate in the care of this patient. A copy of today's note is routed to referring provider  Total face to face encounter time for this patient visit was 45 min. >50% of the time was  spent in counseling and coordination of care.    Earlie Server, MD, PhD Hematology Oncology [facility] at [facility] Pager- [phone] 04/11/2019

## 2019-04-11 NOTE — Progress Notes (Signed)
Pt in as new patient with pulmonary embolus.  Pt also experienced a flare up of gout yesterday.

## 2019-04-20 ENCOUNTER — Telehealth: Payer: Self-pay | Admitting: Cardiovascular Disease

## 2019-04-20 LAB — PROTHROMBIN GENE MUTATION

## 2019-04-20 NOTE — Telephone Encounter (Signed)
Returned call to patient. He reports that Dr. Rockey Situ had asked him to send over his BP #s after starting a new medication at last OV.   8/1 BP 127/89, HR 108 8/2 125/93, HR 108 AM, 123/91, HR 107 PM 8/3 121/85, HR 102 8/4 118/84, HR 96 8/5 124/86, HR 104 8/6 -- 8/7 124/86, HR 101  He denies chest pain, SOB, headache or dizziness. Overall he feels improved.   OV scheduled for f/u 8/27.  Routed to Dr. Rockey Situ as Juluis Rainier for any possible changes.

## 2019-04-20 NOTE — Telephone Encounter (Signed)
Attempted to call patient. LMTCB 04/20/2019

## 2019-04-20 NOTE — Telephone Encounter (Signed)
Patient is calling back with an "average" of his BP readings: Highest was 135/95 HR 110 Lowest was 118/84 HR 96

## 2019-04-20 NOTE — Telephone Encounter (Signed)
Would consider increasing the entresto up to 49/51 mg po BID Continue to monitor pressure Might run a little low, but that is ok as long as not orthostatic Need coupon?

## 2019-04-23 LAB — FACTOR 5 LEIDEN

## 2019-04-26 NOTE — Telephone Encounter (Signed)
Received forms from [employer] placed in nurse box for review

## 2019-04-27 ENCOUNTER — Telehealth: Payer: Self-pay | Admitting: Cardiovascular Disease

## 2019-04-27 NOTE — Telephone Encounter (Signed)
Forms retrieved from nurse bin from [facility].  Red folder placed on Dr. Donivan Scull desk for completion.

## 2019-04-27 NOTE — Telephone Encounter (Signed)
Call to patient to discuss POC from Dr. Rockey Situ in relation to htn.   Pt reports that he would like to hold off on inc in medication at this time d/t BP being improved over the past 3-4 days.   111/78, HR 100 115/87, HR 108 127/85, HR 104.   He reports that HR is always around 100.   Pt denies any chest pain, palpitations, SOB, dizziness or headache.   He agreed to call back Tuesday and let us know how his BP was doing over the weekend.   We also discussed sodium in relation to BP. Advised him to look out for sodium in canned foods, frozen foods and sauces.   Advised pt to call for any further questions or concerns.

## 2019-05-01 MED ORDER — ENTRESTO 49-51 MG PO TABS: 1.0000 | ORAL_TABLET | Freq: Two times a day (BID) | ORAL | 3 refills | Status: DC

## 2019-05-01 NOTE — Telephone Encounter (Signed)
Received forms for physician to complete placed in nurse's box

## 2019-05-01 NOTE — Telephone Encounter (Signed)
Pt calling for update after last telephone encounter. With DBP readings still being on the high side, we will go ahead and inc Entresto dose. Pt has f/u next Thursday. Encouraged him to brings BP log to OV.  Call if having dizziness or orthostatic.   Today: BP 124/84, HR 95 8/17 124/93, 105 8/16 129/92, 102  125/89, 95 8/15 121/90, 97  130/93, 110 8/14 120/87, 96  127/93, 108

## 2019-05-01 NOTE — Telephone Encounter (Signed)
Forms are on providers desk for review.

## 2019-05-01 NOTE — Addendum Note (Signed)
Addended by: Verlon Au on: 05/01/2019 02:53 PM   Modules accepted: Orders

## 2019-05-07 NOTE — Telephone Encounter (Signed)
Patient rep calling back for update on status of form completion patient benefits pending this form being returned.  Please update patient on status.

## 2019-05-10 ENCOUNTER — Encounter: Payer: Self-pay | Admitting: Nurse Practitioner

## 2019-05-10 ENCOUNTER — Ambulatory Visit (INDEPENDENT_AMBULATORY_CARE_PROVIDER_SITE_OTHER): Payer: Managed Care, Other (non HMO) | Admitting: Nurse Practitioner

## 2019-05-10 ENCOUNTER — Other Ambulatory Visit: Payer: Self-pay

## 2019-05-10 ENCOUNTER — Other Ambulatory Visit
Admission: RE | Admit: 2019-05-10 | Discharge: 2019-05-10 | Disposition: A | Discharge status: Home / Self Care | Payer: Managed Care, Other (non HMO) | Source: Ambulatory Visit | Attending: Nurse Practitioner | Admitting: Nurse Practitioner

## 2019-05-10 VITALS — BP 98/78 | HR 99 | Ht 70.0 in | Wt 213.5 lb

## 2019-05-10 DIAGNOSIS — I2694 Multiple subsegmental pulmonary emboli without acute cor pulmonale: Secondary | ICD-10-CM

## 2019-05-10 DIAGNOSIS — I428 Other cardiomyopathies: Secondary | ICD-10-CM

## 2019-05-10 DIAGNOSIS — I5022 Chronic systolic (congestive) heart failure: Secondary | ICD-10-CM

## 2019-05-10 DIAGNOSIS — I420 Dilated cardiomyopathy: Secondary | ICD-10-CM

## 2019-05-10 DIAGNOSIS — E782 Mixed hyperlipidemia: Secondary | ICD-10-CM

## 2019-05-10 DIAGNOSIS — K922 Gastrointestinal hemorrhage, unspecified: Secondary | ICD-10-CM

## 2019-05-10 DIAGNOSIS — I10 Essential (primary) hypertension: Secondary | ICD-10-CM

## 2019-05-10 LAB — CBC
HCT: 46.6 % (ref 39.0–52.0)
Hemoglobin: 14.8 g/dL (ref 13.0–17.0)
MCH: 27.1 pg (ref 26.0–34.0)
MCHC: 31.8 g/dL (ref 30.0–36.0)
MCV: 85.3 fL (ref 80.0–100.0)
Platelets: 237 10*3/uL (ref 150–400)
RBC: 5.46 MIL/uL (ref 4.22–5.81)
RDW: 17.7 % — ABNORMAL HIGH (ref 11.5–15.5)
WBC: 6.7 10*3/uL (ref 4.0–10.5)
nRBC: 0 % (ref 0.0–0.2)

## 2019-05-10 LAB — BASIC METABOLIC PANEL
Anion gap: 10 (ref 5–15)
BUN: 23 mg/dL — ABNORMAL HIGH (ref 6–20)
CO2: 25 mmol/L (ref 22–32)
Calcium: 9.4 mg/dL (ref 8.9–10.3)
Chloride: 106 mmol/L (ref 98–111)
Creatinine, Ser: 0.96 mg/dL (ref 0.61–1.24)
GFR calc Af Amer: >60 mL/min (ref >60)
GFR calc non Af Amer: >60 mL/min (ref >60)
Glucose, Bld: 100 mg/dL — ABNORMAL HIGH (ref 70–99)
Potassium: 4.1 mmol/L (ref 3.5–5.1)
Sodium: 141 mmol/L (ref 135–145)

## 2019-05-10 NOTE — Patient Instructions (Signed)
Medication Instructions:  Your physician recommends that you continue on your current medications as directed. Please refer to the Current Medication list given to you today.  If you need a refill on your cardiac medications before your next appointment, please call your pharmacy.   Lab work: Your physician recommends that you return for lab work TODAY at the medical mall. No appt is needed. Hours are M-F 7AM- 6 PM.  If you have labs (blood work) drawn today and your tests are completely normal, you will receive your results only by: Marland Kitchen MyChart Message (if you have MyChart) OR . A paper copy in the mail If you have any lab test that is abnormal or we need to change your treatment, we will call you to review the results.  Testing/Procedures: Echo  Please return to [facility] on __________2 months____ at _______________ AM/PM for an Echocardiogram. Your physician has requested that you have an echocardiogram. Echocardiography is a painless test that uses sound waves to create images of your heart. It provides your doctor with information about the size and shape of your heart and how well your heart's chambers and valves are working. This procedure takes approximately one hour. There are no restrictions for this procedure. Please note; depending on visual quality an IV may need to be placed.    Follow-Up: At [facility], you and your health needs are our priority.  As part of our continuing mission to provide you with exceptional heart care, we have created designated Provider Care Teams.  These Care Teams include your primary Cardiologist (physician) and Advanced Practice Providers (APPs -  Physician Assistants and Nurse Practitioners) who all work together to provide you with the care you need, when you need it. You will need a follow up appointment in 3 months.  You may see Ida Rogue, MD or Murray Hodgkins, NP.

## 2019-05-10 NOTE — Progress Notes (Addendum)
Office Visit    Patient Name: David Carlson Date of Encounter: 05/10/2019  Primary Care Provider:  [place] Primary Cardiologist:  Ida Rogue, MD  Chief Complaint    48 year old male with a history of palpitations, hypertension, hyperlipidemia, obesity, GERD, anxiety, PVCs, and erectile dysfunction, who presents for follow-up after recent diagnosis of bilateral PE and subsequent GI bleed.  Past Medical History    Past Medical History:  Diagnosis Date   Anxiety    Bilateral pulmonary embolism ([place])    a. 03/2019 CTA Chest: multifocal occlusive segmental to subsegmental embolus present bilaterally, most notably in the bilateral lower lobes.  Concern for right middle lobe and right lower lobe pulmonary infarction-->Xarelto initiated.   Broken shoulder    Collar bone fracture    x 2   Erectile dysfunction    GERD (gastroesophageal reflux disease)    GIB (gastrointestinal bleeding)    a. 03/2019 in setting of Xarelto 15mg  BID after PE dx-->GI rec conservative rx given NICM/PE and stable H/H.   Gout    H/O Bell's palsy    HFrEF (heart failure with reduced ejection fraction) ([place])    a. 03/2019 Echo: EF 20-25%, restrictive filling. Mildly reduced RV fxn. RVSP 42.5 (also acute PE). Mild to mod MR/TR. Mod LAE, Mild RAE.   Hyperlipidemia    Hypertension    NICM (nonischemic cardiomyopathy) ([place])    a. 03/2019 Echo: EF 20-25%; b. 03/2019 Cath: LM nl, LAD 80m, RI nl, LCX nl, RCA large, 10p.   Obesity    Palpitations    a. Previously evaluted when he was incarcerated in [place] - reportedly nl stress test.   PVC's (premature ventricular contractions)    a. 04/2013 Echo: EF 50-55%, Gr1 DD, mildly dil LA;  b. 04/2017 48hr holter: freq PVCs (5.4%), rare PACs. Single 13 beat atrial run - 143 bpm.   Right leg DVT ([place])    a. 03/2019 LE U/S: Chronic R peroneal DVT.   Past Surgical History:  Procedure Laterality Date   CARPAL TUNNEL RELEASE  Bilateral    HERNIA REPAIR  2015   HIP SURGERY     a. Age 75 - failure of growth plate to close - Pins inserted   LEFT HEART CATH AND CORONARY ANGIOGRAPHY N/A 03/27/2019   Procedure: LEFT HEART CATH AND CORONARY ANGIOGRAPHY;  Surgeon: Nelva Bush, MD;  Location: [place] [facility];  Service: Cardiovascular;  Laterality: N/A;    Allergies  No Known Allergies  History of Present Illness    48 year old male with the above past medical history including palpitations, hypertension, hyperlipidemia, GERD, anxiety, obesity, and erectile dysfunction.  History of palpitations date back to age 89.  He underwent stress testing while incarcerated in [place] in 2008 which was reportedly normal.  He was told he was having PVCs.  He had resolution of palpitations after losing some weight but then 2018 noted recurrent palpitations.  Echo at that time showed normal LV function with diastolic dysfunction.  48-hour Holter monitor showed frequent PVCs accounting for 5.4% of total beats.  He was placed on beta-blocker therapy.  He was seen in clinic recently in early July secondary to progressive dyspnea and recent left lower extremity swelling.  In the setting of being more sedentary over a several month period, I had concern for lower extremity DVT.  He is also tachycardic and thus I was concerned about PE.  Lower extremity ultrasound did not show DVT on the left however a  chronic DVT was noted on the right.  He was sent for stat CTA of the chest which showed multifocal occlusive segmental to subsegmental pulmonary emboli bilaterally, most notably in the bilateral lower lobes along with groundglass and consolidative opacity of the left lung base and to a lesser extent the lateral segment of the right middle lobe and lateral segment of the right lower lobe, distal to the embolism concerning for developing pulmonary infarction.  Patient was contacted and subsequently admitted.  He was placed on anticoagulation and  echocardiogram during hospitalization showed an EF of 20 to 25% with reduced RV systolic function, and mild to moderate MR and TR.  In the setting of new LV dysfunction, he underwent diagnostic catheterization which showed mild nonobstructive CAD.  He was placed on appropriate therapy for nonischemic cardiomyopathy and subsequently discharged.  Unfortunately, he returned to the ER on July 17 secondary to GI bleeding in the setting of Xarelto 15 mg twice daily therapy.  Hemoglobin was stable and GI recommended conservative management given recent cardiac and pulmonary findings.  Xarelto was briefly held and then resumed at the lower maintenance dose of 20 mg daily.    He followed up via telehealth visit on July 22 at which time he was stable and was transitioned from losartan to [place].  Entresto dose has since been titrated to 49-51 mg twice daily.  He notes that he has been doing well over the past few weeks.  He denies chest pain, dyspnea, palpitations, PND, orthopnea, dizziness, syncope, edema, or early satiety.  He has tolerated his medications well and reports compliance.  Blood pressures trending in the 1 teens at home.  His weight is been stable on his home scale.  Home Medications    Prior to Admission medications   Medication Sig Start Date End Date Taking? Authorizing Provider  albuterol (VENTOLIN HFA) 108 (90 Base) MCG/ACT inhaler Inhale 2 puffs into the lungs every 6 (six) hours as needed for wheezing or shortness of breath. 01/30/19   Hubbard Hartshorn, FNP  atorvastatin (LIPITOR) 20 MG tablet Take 1 tablet (20 mg total) by mouth daily at 6 PM. 10/13/18   Lada, Satira Anis, MD  carvedilol (COREG) 6.25 MG tablet Take 1 tablet (6.25 mg total) by mouth 2 (two) times daily with a meal. 03/28/19 05/27/19  Demetrios Loll, MD  colchicine 0.6 MG tablet Take 0.6 mg by mouth daily.    [provider]  cyclobenzaprine (FLEXERIL) 10 MG tablet Take 10 mg by mouth 3 (three) times daily as needed. for muscle  spams 08/27/18   [provider]  fluticasone (FLONASE) 50 MCG/ACT nasal spray Place 2 sprays into both nostrils daily. 01/30/19   Hubbard Hartshorn, FNP  furosemide (LASIX) 40 MG tablet Take 1 tablet (40 mg total) by mouth daily. 03/29/19   Demetrios Loll, MD  Multiple Vitamin (MULTIVITAMIN WITH MINERALS) TABS tablet Take 1 tablet by mouth daily.    [provider]  pantoprazole (PROTONIX) 40 MG tablet Take 1 tablet (40 mg total) by mouth daily as needed. Caution:prolonged use may cause health problems; appt needed Patient taking differently: Take 40 mg by mouth daily as needed (GERD symptoms). Caution:prolonged use may cause health problems; appt needed 10/13/18   Lada, Satira Anis, MD  polyethylene glycol (MIRALAX / GLYCOLAX) 17 g packet Take 17 g by mouth daily as needed for mild constipation. 04/03/19   [name], MD  rivaroxaban (XARELTO) 20 MG TABS tablet Take 1 tablet (20 mg total) by  mouth daily. 04/04/19   [name], MD  sacubitril-valsartan (ENTRESTO) 49-51 MG Take 1 tablet by mouth 2 (two) times daily. 05/01/19   Minna Merritts, MD  senna-docusate (SENOKOT-S) 8.6-50 MG tablet Take 2 tablets by mouth daily as needed for mild constipation. Patient not taking: Reported on 04/11/2019 04/03/19 04/02/20  [name], MD    Review of Systems    He denies chest pain, palpitations, dyspnea, pnd, orthopnea, n, v, dizziness, syncope, edema, weight gain, or early satiety.  All other systems reviewed and are otherwise negative except as noted above.  Physical Exam    VS:  BP 98/78 (BP Location: Left Arm, Patient Position: Sitting, Cuff Size: Normal)    Pulse 99    Ht 5\' 10"  (1.778 m)    Wt 213 lb 8 oz (96.8 kg)    BMI 30.63 kg/m  , BMI Body mass index is 30.63 kg/m. GEN: Well nourished, well developed, in no acute distress. HEENT: normal. Neck: Supple, no JVD, carotid bruits, or masses. Cardiac: RRR, no murmurs, rubs, or gallops. No clubbing, cyanosis,  edema.  Radials/DP/PT 2+ and equal bilaterally.  Respiratory:  Respirations regular and unlabored, clear to auscultation bilaterally. GI: Soft, nontender, nondistended, BS + x 4. MS: no deformity or atrophy. Skin: Multiple tattoos.  Warm and dry, no rash. Neuro:  Strength and sensation are intact. Psych: Normal affect.  Accessory Clinical Findings    ECG personally reviewed by me today -regular sinus rhythm, 99, anterolateral T wave inversion - no acute changes.  Lab Results  Component Value Date   WBC 6.7 05/10/2019   HGB 14.8 05/10/2019   HCT 46.6 05/10/2019   MCV 85.3 05/10/2019   PLT 237 05/10/2019   Lab Results  Component Value Date   CREATININE 0.96 05/10/2019   BUN 23 (H) 05/10/2019   NA 141 05/10/2019   K 4.1 05/10/2019   CL 106 05/10/2019   CO2 25 05/10/2019   Lab Results  Component Value Date   ALT 38 04/11/2019   AST 26 04/11/2019   ALKPHOS 85 04/11/2019   BILITOT 0.9 04/11/2019   Lab Results  Component Value Date   CHOL 69 03/26/2019   HDL 12 (L) 03/26/2019   LDLCALC 46 03/26/2019   TRIG 53 03/26/2019   CHOLHDL 5.8 03/26/2019     Assessment & Plan    1.  Bilateral pulmonary emboli: Status post recent admission in July.  Tolerating Xarelto therapy well-20 mg daily.  He did have GI bleeding however denies any recurrent melena or bright red blood per rectum.  He plans to follow-up with GI as an outpatient and will likely undergo EGD and colonoscopy once he can come off of anticoagulation in about 6 months.  Breathing has improved.  2.  HFrEF/nonischemic cardiomyopathy: Well compensated and euvolemic on examination today.  Tolerating beta-blocker and Entresto well.  Also on Lasix 40 mg daily.  Lab work today shows stable renal function and potassium.  In the setting of somewhat soft blood pressures-98/78 today and 1 teens at home, I will not add an MRA at this time.  Could consider spironolactone in the future if pressure allows.  Plan to follow-up  echocardiogram in about 2 more months to reevaluate LV function and determine candidacy for ICD therapy.  3.  GI bleed: Recent admission with GI bleed though H&H were relatively stable.  He remains on Xarelto and H&H is stable today at 14.8 and 46.6.  He will follow-up with GI as an outpatient.  4.  Essential hypertension: As above, blood pressure stable to soft.  Continue current regimen.  5.  Hyperlipidemia: On atorvastatin with an LDL of 46 in July.  Normal LFTs in late July.  6.  Disposition: Follow-up echo in approximately 2 months with clinic follow-up at that time.   Murray Hodgkins, NP 05/10/2019, 6:07 PM

## 2019-05-11 NOTE — Telephone Encounter (Signed)
Received completed [employer] forms, sent interoffice.

## 2019-05-30 ENCOUNTER — Other Ambulatory Visit: Payer: Self-pay | Admitting: Cardiovascular Disease

## 2019-05-30 NOTE — Telephone Encounter (Signed)
°*  STAT* If patient is at the pharmacy, call can be transferred to refill team.   1. Which medications need to be refilled? (please list name of each medication and dose if known) furosemide 40 mg daily, carvedilol 6.25 mg bid  2. Which pharmacy/location (including street and city if local pharmacy) is medication to be sent to? [facility] on [address]  3. Do they need a 30 day or 90 day supply? 90 day

## 2019-05-31 MED ORDER — CARVEDILOL 6.25 MG PO TABS: 6.2500 mg | ORAL_TABLET | Freq: Two times a day (BID) | ORAL | 0 refills | Status: DC

## 2019-05-31 MED ORDER — FUROSEMIDE 40 MG PO TABS: 40.0000 mg | ORAL_TABLET | Freq: Every day | ORAL | 0 refills | Status: DC

## 2019-05-31 NOTE — Telephone Encounter (Signed)
Requested Prescriptions   Signed Prescriptions Disp Refills  . furosemide (LASIX) 40 MG tablet 90 tablet 0    Sig: Take 1 tablet (40 mg total) by mouth daily.    Authorizing Provider: Theora Gianotti    Ordering User: NEWCOMER MCCLAIN, BRANDY L  . carvedilol (COREG) 6.25 MG tablet 180 tablet 0    Sig: Take 1 tablet (6.25 mg total) by mouth 2 (two) times daily with a meal.    Authorizing Provider: Theora Gianotti    Ordering User: NEWCOMER MCCLAIN, BRANDY L

## 2019-06-12 ENCOUNTER — Other Ambulatory Visit: Payer: Self-pay | Admitting: Cardiovascular Disease

## 2019-06-12 MED ORDER — RIVAROXABAN 20 MG PO TABS: 20.0000 mg | ORAL_TABLET | Freq: Every day | ORAL | 0 refills | Status: DC

## 2019-06-12 NOTE — Telephone Encounter (Signed)
Refill Request.  

## 2019-06-12 NOTE — Telephone Encounter (Signed)
°*  STAT* If patient is at the pharmacy, call can be transferred to refill team.   1. Which medications need to be refilled? (please list name of each medication and dose if known) Xarelto 20mg   2. Which pharmacy/location (including street and city if local pharmacy) is medication to be sent to? [facility] on [address]  3. Do they need a 30 day or 90 day supply? [place]

## 2019-06-20 ENCOUNTER — Inpatient Hospital Stay: Payer: 59 | Attending: Oncology

## 2019-06-20 ENCOUNTER — Other Ambulatory Visit: Payer: Self-pay

## 2019-06-20 ENCOUNTER — Encounter: Payer: Self-pay | Admitting: Oncology

## 2019-06-20 ENCOUNTER — Inpatient Hospital Stay (HOSPITAL_BASED_OUTPATIENT_CLINIC_OR_DEPARTMENT_OTHER): Payer: 59 | Admitting: Oncology

## 2019-06-20 VITALS — BP 127/90 | HR 94 | Temp 95.3°F | Resp 18 | Wt 224.0 lb

## 2019-06-20 DIAGNOSIS — Z7901 Long term (current) use of anticoagulants: Secondary | ICD-10-CM | POA: Insufficient documentation

## 2019-06-20 DIAGNOSIS — Z79899 Other long term (current) drug therapy: Secondary | ICD-10-CM | POA: Diagnosis not present

## 2019-06-20 DIAGNOSIS — I110 Hypertensive heart disease with heart failure: Secondary | ICD-10-CM | POA: Insufficient documentation

## 2019-06-20 DIAGNOSIS — I2609 Other pulmonary embolism with acute cor pulmonale: Secondary | ICD-10-CM | POA: Insufficient documentation

## 2019-06-20 DIAGNOSIS — K219 Gastro-esophageal reflux disease without esophagitis: Secondary | ICD-10-CM | POA: Diagnosis not present

## 2019-06-20 DIAGNOSIS — I5022 Chronic systolic (congestive) heart failure: Secondary | ICD-10-CM

## 2019-06-20 DIAGNOSIS — E785 Hyperlipidemia, unspecified: Secondary | ICD-10-CM | POA: Insufficient documentation

## 2019-06-20 DIAGNOSIS — D649 Anemia, unspecified: Secondary | ICD-10-CM | POA: Diagnosis not present

## 2019-06-20 DIAGNOSIS — I82551 Chronic embolism and thrombosis of right peroneal vein: Secondary | ICD-10-CM | POA: Diagnosis not present

## 2019-06-20 DIAGNOSIS — R5383 Other fatigue: Secondary | ICD-10-CM | POA: Diagnosis not present

## 2019-06-20 DIAGNOSIS — R748 Abnormal levels of other serum enzymes: Secondary | ICD-10-CM | POA: Diagnosis not present

## 2019-06-20 LAB — COMPREHENSIVE METABOLIC PANEL
ALT: 24 U/L (ref 0–44)
AST: 26 U/L (ref 15–41)
Albumin: 4.1 g/dL (ref 3.5–5.0)
Alkaline Phosphatase: 51 U/L (ref 38–126)
Anion gap: 9 (ref 5–15)
BUN: 21 mg/dL — ABNORMAL HIGH (ref 6–20)
CO2: 27 mmol/L (ref 22–32)
Calcium: 9.1 mg/dL (ref 8.9–10.3)
Chloride: 104 mmol/L (ref 98–111)
Creatinine, Ser: 1.1 mg/dL (ref 0.61–1.24)
GFR calc Af Amer: >60 mL/min (ref >60)
GFR calc non Af Amer: >60 mL/min (ref >60)
Glucose, Bld: 106 mg/dL — ABNORMAL HIGH (ref 70–99)
Potassium: 3.5 mmol/L (ref 3.5–5.1)
Sodium: 140 mmol/L (ref 135–145)
Total Bilirubin: 1.1 mg/dL (ref 0.3–1.2)
Total Protein: 7.6 g/dL (ref 6.5–8.1)

## 2019-06-20 LAB — CBC WITH DIFFERENTIAL/PLATELET
Abs Immature Granulocytes: 0.02 10*3/uL (ref 0.00–0.07)
Basophils Absolute: 0 10*3/uL (ref 0.0–0.1)
Basophils Relative: 1 %
Eosinophils Absolute: 0.2 10*3/uL (ref 0.0–0.5)
Eosinophils Relative: 4 %
HCT: 37.6 % — ABNORMAL LOW (ref 39.0–52.0)
Hemoglobin: 12.8 g/dL — ABNORMAL LOW (ref 13.0–17.0)
Immature Granulocytes: 0 %
Lymphocytes Relative: 32 %
Lymphs Abs: 1.7 10*3/uL (ref 0.7–4.0)
MCH: 29.1 pg (ref 26.0–34.0)
MCHC: 34 g/dL (ref 30.0–36.0)
MCV: 85.5 fL (ref 80.0–100.0)
Monocytes Absolute: 0.5 10*3/uL (ref 0.1–1.0)
Monocytes Relative: 10 %
Neutro Abs: 2.9 10*3/uL (ref 1.7–7.7)
Neutrophils Relative %: 53 %
Platelets: 219 10*3/uL (ref 150–400)
RBC: 4.4 MIL/uL (ref 4.22–5.81)
RDW: 18.6 % — ABNORMAL HIGH (ref 11.5–15.5)
WBC: 5.4 10*3/uL (ref 4.0–10.5)
nRBC: 0 % (ref 0.0–0.2)

## 2019-06-20 LAB — IRON AND TIBC
Iron: 163 ug/dL (ref 45–182)
Saturation Ratios: 48 % — ABNORMAL HIGH (ref 17.9–39.5)
TIBC: 339 ug/dL (ref 250–450)
UIBC: 176 ug/dL

## 2019-06-20 NOTE — Progress Notes (Signed)
Hematology/Oncology Consult note [facility] Telephone:([phone] [phone]   Patient Care Team: [place] as PCP - General (Family Medicine) Rockey Situ, Kathlene November, MD as PCP - Cardiology (Cardiology)  REFERRING PROVIDER: No ref. provider found  CHIEF COMPLAINTS/REASON FOR VISIT:  Follow up  of bilateral pulmonary embolism.  HISTORY OF PRESENTING ILLNESS:   David Carlson is a  48 y.o.  male with PMH listed below was seen in consultation at the request of  No ref. provider found  for evaluation of bilateral pulmonary embolism. Patient was admitted from 03/22/2021 03/28/2019 due to acute bilateral pulmonary embolism. Patient is initially presented to emergency room complaining shortness of breath, ongoing for the past 6 months.  Also associated with left-sided chest pain. 03/23/2019 CT angiogram chest PE protocol was independently reviewed by me. Images revealed multifocal occlusive segmental to subsegmental pulmonary embolism bilaterally, pulmonary infarction, pleural effusion and a diffuse bilateral interlobular septal thickening likely edema.  Cardiomegaly. 03/23/2019 [place] lower extremity venous Doppler showed chronic DVT involving right peroneal veins. Hypercoagulable work-up was sent off. Patient was started on heparin drip and is switched to oral Xarelto at discharge. Patient has CT evidence of heart strain.  Evaluated by cardiology and also had left heart cath with findings of mild nonobstructive coronary artery disease.  Findings are consistent with nonischemic cardiomyopathy.  Severely elevated left ventricular filling pressure.  2D echo showed acute heart failure 20 to 25% ejection fraction.  Patient takes Coreg 6.25 twice daily, Lasix 40 mg daily Entresto 24/26 mg, he had a virtual visit with Dr. Rockey Situ on 04/04/2019.   Patient was discharged on 03/28/2019 and presented back to emergency room on 03/30/2019 after having 2 bloody  bowel movements.  Patient was seen by gastroenterology Dr. Bonna Gains, given his recent CHF, bilateral PE/high risk of endoscopy procedures, suggest to manage conservatively..  Patient's Xarelto 20 mg daily was held for a few days and restarted.  Patient reports that no additional bleeding events.  Elevated liver enzymes- improved from last hospitalization.RUQ ultrasound7/14showed cholelithiasis and gallbladder wall thickening. Surgery recommended outpatient follow-up.  Today he reports tolerating anticoagulation with Xarelto well.  On 20 mg daily.Denies hematochezia, hematuria, hematemesis, epistaxis, black tarry stool or easy bruising.  Reports breathing is better.still has sob with exertion. Fatigue  Denies any chest pain today.    INTERVAL HISTORY David Carlson is a 48 y.o. male who has above history reviewed by me today presents for follow up visit for management of unprovoked pulmonary embolism. Problems and complaints are listed below: Patient has been on Xarelto 20 mg daily.  He reports being compliant.  Denies any bleeding events.  Tolerating well. No new complaints today.  Breathing is much better. He drinks alcohol some days.  Beer or hard liquor.  Review of Systems  Constitutional: Negative for appetite change, chills, fatigue, fever and unexpected weight change.  HENT:   Negative for hearing loss and voice change.   Eyes: Negative for eye problems and icterus.  Respiratory: Negative for chest tightness, cough and shortness of breath.   Cardiovascular: Negative for chest pain and leg swelling.  Gastrointestinal: Negative for abdominal distention and abdominal pain.  Endocrine: Negative for hot flashes.  Genitourinary: Negative for difficulty urinating, dysuria and frequency.   Musculoskeletal: Negative for arthralgias.  Skin: Negative for itching and rash.  Neurological: Negative for light-headedness and numbness.  Hematological: Negative for adenopathy. Does not  bruise/bleed easily.  Psychiatric/Behavioral: Negative for confusion.    MEDICAL  HISTORY:  Past Medical History:  Diagnosis Date  . Anxiety   . Bilateral pulmonary embolism ([place])    a. 03/2019 CTA Chest: multifocal occlusive segmental to subsegmental embolus present bilaterally, most notably in the bilateral lower lobes.  Concern for right middle lobe and right lower lobe pulmonary infarction-->Xarelto initiated.  . Broken shoulder   . Collar bone fracture    x 2  . Erectile dysfunction   . GERD (gastroesophageal reflux disease)   . GIB (gastrointestinal bleeding)    a. 03/2019 in setting of Xarelto 15mg  BID after PE dx-->GI rec conservative rx given NICM/PE and stable H/H.  Marland Kitchen Gout   . H/O Bell's palsy   . HFrEF (heart failure with reduced ejection fraction) ([place])    a. 03/2019 Echo: EF 20-25%, restrictive filling. Mildly reduced RV fxn. RVSP 42.5 (also acute PE). Mild to mod MR/TR. Mod LAE, Mild RAE.  Marland Kitchen Hyperlipidemia   . Hypertension   . NICM (nonischemic cardiomyopathy) ([place])    a. 03/2019 Echo: EF 20-25%; b. 03/2019 Cath: LM nl, LAD 55m, RI nl, LCX nl, RCA large, 10p.  . Obesity   . Palpitations    a. Previously evaluted when he was incarcerated in [place] - reportedly nl stress test.  . PVC's (premature ventricular contractions)    a. 04/2013 Echo: EF 50-55%, Gr1 DD, mildly dil LA;  b. 04/2017 48hr holter: freq PVCs (5.4%), rare PACs. Single 13 beat atrial run - 143 bpm.  . Right leg DVT ([place])    a. 03/2019 LE U/S: Chronic R peroneal DVT.    SURGICAL HISTORY: Past Surgical History:  Procedure Laterality Date  . CARPAL TUNNEL RELEASE Bilateral   . HERNIA REPAIR  2015  . HIP SURGERY     a. Age 36 - failure of growth plate to close - Pins inserted  . LEFT HEART CATH AND CORONARY ANGIOGRAPHY N/A 03/27/2019   Procedure: LEFT HEART CATH AND CORONARY ANGIOGRAPHY;  Surgeon: Nelva Bush, MD;  Location: [place] [facility];  Service: Cardiovascular;  Laterality: N/A;    SOCIAL  HISTORY: Social History   Socioeconomic History  . Marital status: Single    Spouse name: Not on file  . Number of children: 2  . Years of education: 50  . Highest education level: High school graduate  Occupational History  . Not on file  Social Needs  . Financial resource strain: Not hard at all  . Food insecurity    Worry: Never true    Inability: Never true  . Transportation needs    Medical: No    Non-medical: Not on file  Tobacco Use  . Smoking status: Former Smoker    Packs/day: 1.00    Years: 20.00    Pack years: 20.00    Types: Cigarettes    Quit date: 09/15/2015    Years since quitting: 3.7  . Smokeless tobacco: Never Used  Substance and Sexual Activity  . Alcohol use: Yes    Alcohol/week: 0.0 standard drinks    Comment: 3-6 cans/beer on weeknights, 6-12 beers on weekends.  . Drug use: No  . Sexual activity: Yes  Lifestyle  . Physical activity    Days per week: 0 days    Minutes per session: 0 min  . Stress: Not at all  Relationships  . Social connections    Talks on phone: More than three times a week    Gets together: Once a week    Attends religious service: Never    Active  member of club or organization: No    Attends meetings of clubs or organizations: Never    Relationship status: Divorced  . Intimate partner violence    Fear of current or ex partner: Patient refused    Emotionally abused: Patient refused    Physically abused: Patient refused    Forced sexual activity: Patient refused  Other Topics Concern  . Not on file  Social History Narrative   Lives in [place] with his ex-girlfriend's brother.  Works @ [employer] in [employer].  Active @ work w/o limitations but does not routinely exercise.    FAMILY HISTORY: Family History  Problem Relation Age of Onset  . Diabetes Mother   . Hypertension Father   . Heart disease Maternal Grandmother   . Heart attack Maternal Grandmother   . Diabetes Maternal Grandfather   . Diabetes  Paternal Grandfather     ALLERGIES:  has No Known Allergies.  MEDICATIONS:  Current Outpatient Medications  Medication Sig Dispense Refill  . albuterol (VENTOLIN HFA) 108 (90 Base) MCG/ACT inhaler Inhale 2 puffs into the lungs every 6 (six) hours as needed for wheezing or shortness of breath. 1 Inhaler 0  . atorvastatin (LIPITOR) 20 MG tablet Take 1 tablet (20 mg total) by mouth daily at 6 PM. 90 tablet 0  . carvedilol (COREG) 6.25 MG tablet Take 1 tablet (6.25 mg total) by mouth 2 (two) times daily with a meal. 180 tablet 0  . colchicine 0.6 MG tablet Take 0.6 mg by mouth daily as needed.     . fluticasone (FLONASE) 50 MCG/ACT nasal spray Place 2 sprays into both nostrils daily. 16 g 6  . furosemide (LASIX) 40 MG tablet Take 1 tablet (40 mg total) by mouth daily. 90 tablet 0  . Multiple Vitamin (MULTIVITAMIN WITH MINERALS) TABS tablet Take 1 tablet by mouth daily.    . pantoprazole (PROTONIX) 40 MG tablet Take 1 tablet (40 mg total) by mouth daily as needed. Caution:prolonged use may cause health problems; appt needed (Patient taking differently: Take 40 mg by mouth daily as needed (GERD symptoms). Caution:prolonged use may cause health problems; appt needed) 90 tablet 1  . polyethylene glycol (MIRALAX / GLYCOLAX) 17 g packet Take 17 g by mouth daily as needed for mild constipation. 14 each 0  . rivaroxaban (XARELTO) 20 MG TABS tablet Take 1 tablet (20 mg total) by mouth daily. 90 tablet 0  . sacubitril-valsartan (ENTRESTO) 49-51 MG Take 1 tablet by mouth 2 (two) times daily. 60 tablet 3  . cyclobenzaprine (FLEXERIL) 10 MG tablet Take 10 mg by mouth 3 (three) times daily as needed. for muscle spams     No current facility-administered medications for this visit.      PHYSICAL EXAMINATION: ECOG PERFORMANCE STATUS: 1 - Symptomatic but completely ambulatory Vitals:   06/20/19 1005  BP: 127/90  Pulse: 94  Resp: 18  Temp: (!) 95.3 F (35.2 C)   Filed Weights   06/20/19 1005  Weight:  224 lb (101.6 kg)    Physical Exam Constitutional:      General: He is not in acute distress. HENT:     Head: Normocephalic and atraumatic.  Eyes:     General: No scleral icterus.    Pupils: Pupils are equal, round, and reactive to light.  Neck:     Musculoskeletal: Normal range of motion and neck supple.  Cardiovascular:     Rate and Rhythm: Normal rate and regular rhythm.     Heart sounds: Normal heart  sounds.  Pulmonary:     Effort: Pulmonary effort is normal. No respiratory distress.     Breath sounds: No wheezing.  Abdominal:     General: Bowel sounds are normal. There is no distension.     Palpations: Abdomen is soft. There is no mass.     Tenderness: There is no abdominal tenderness.  Musculoskeletal: Normal range of motion.        General: No swelling or deformity.  Skin:    General: Skin is warm and dry.     Findings: No erythema or rash.     Comments: Tattoo.   Neurological:     Mental Status: He is alert and oriented to person, place, and time.     Cranial Nerves: No cranial nerve deficit.     Coordination: Coordination normal.  Psychiatric:        Behavior: Behavior normal.        Thought Content: Thought content normal.     LABORATORY DATA:  I have reviewed the data as listed Lab Results  Component Value Date   WBC 5.4 06/20/2019   HGB 12.8 (L) 06/20/2019   HCT 37.6 (L) 06/20/2019   MCV 85.5 06/20/2019   PLT 219 06/20/2019   Recent Labs    03/27/19 1736  03/28/19 0700  04/02/19 0440 04/11/19 1054 05/10/19 1037 06/20/19 0934  NA  --    < >  --    < > 136 139 141 140  K  --    < >  --    < > 4.0 4.4 4.1 3.5  CL  --    < >  --    < > 102 108 106 104  CO2  --    < >  --    < > 25 28 25 27   GLUCOSE  --    < >  --    < > 95 96 100* 106*  BUN  --    < >  --    < > 21* 20 23* 21*  CREATININE  --    < >  --    < > 0.77 0.75 0.96 1.10  CALCIUM  --    < >  --    < > 8.3* 9.0 9.4 9.1  GFRNONAA  --    < >  --    < > >60 >60 >60 >60  GFRAA  --    < >   --    < > >60 >60 >60 >60  PROT 6.0*  --  5.9*   < > 6.8 8.4*  --  7.6  ALBUMIN 2.9*  --  2.7*   < > 3.2* 3.8  --  4.1  AST 135*  --  103*   < > 33 26  --  26  ALT 163*  --  141*   < > 59* 38  --  24  ALKPHOS 75  --  79   < > 85 85  --  51  BILITOT 2.1*  --  2.0*   < > 1.1 0.9  --  1.1  BILIDIR 1.0*  --  0.9*  --   --   --   --   --   IBILI 1.1*  --  1.1*  --   --   --   --   --    < > = values in this interval not displayed.   Iron/TIBC/Ferritin/ %Sat    Component Value Date/Time  IRON 163 06/20/2019 0934   TIBC 339 06/20/2019 0934   IRONPCTSAT 48 (H) 06/20/2019 0934      RADIOGRAPHIC STUDIES: I have personally reviewed the radiological images as listed and agreed with the findings in the report.  No results found.    ASSESSMENT & PLAN:  1. Other acute pulmonary embolism with acute cor pulmonale ([place])   2. Chronic deep vein thrombosis (DVT) of right peroneal vein (HCC)   3. Chronic systolic congestive heart failure (HCC)    Unprovoked acute bilateral multifocal pulmonary embolism, chronic lower extremity DVT. Labs reviewed and discussed with patient. Clinically he is doing well, tolerating Xarelto 20 mg daily. Recommend long-term anticoagulation. He needs to complete another 3 months of full dose anticoagulation with Xarelto After that, we will discuss about decreasing to maintenance Xarelto 10 mg  #Anemia, normocytic.  Hemoglobin 12.8, slightly lower than previously.  Denies any blood in the stool.  Will add iron panel today. CHF, likely doing well.  Recommend patient to continue follow up with cardiology. On lasix, Entresto and Coreg.   Orders Placed This Encounter  Procedures  . Iron and TIBC    Standing Status:   Future    Number of Occurrences:   1    Standing Expiration Date:   06/19/2020  . CBC with Differential/Platelet    Standing Status:   Future    Standing Expiration Date:   06/19/2020  . Comprehensive metabolic panel    Standing Status:   Future     Standing Expiration Date:   06/19/2020    All questions were answered. The patient knows to call the clinic with any problems questions or concerns.    Return of visit: 3 months.   Earlie Server, MD, PhD Hematology Oncology [facility] Pager- [phone] 06/20/2019

## 2019-06-20 NOTE — Progress Notes (Signed)
Patient does not offer any problems today.  

## 2019-07-10 ENCOUNTER — Other Ambulatory Visit: Payer: Self-pay

## 2019-07-10 ENCOUNTER — Ambulatory Visit (INDEPENDENT_AMBULATORY_CARE_PROVIDER_SITE_OTHER): Payer: 59

## 2019-07-10 DIAGNOSIS — I428 Other cardiomyopathies: Secondary | ICD-10-CM | POA: Diagnosis not present

## 2019-07-12 ENCOUNTER — Telehealth: Payer: Self-pay

## 2019-07-12 NOTE — Telephone Encounter (Signed)
Call attempted, phone did not ring at either number.   I tested phone with mine to make sure it was working and call went through.   Will try back at later time.

## 2019-07-12 NOTE — Telephone Encounter (Signed)
-----   Message from Theora Gianotti, NP sent at 07/11/2019  8:16 AM EDT ----- Heart squeezing fxn is improved but remains low @ 30-35%. Cont current meds, daily weights, Na restriction, etc. F/u w/ Dr. Rockey Situ as planned in Nov to discuss possible EP referral for ICD therapy.

## 2019-07-13 NOTE — Telephone Encounter (Signed)
I spoke with the patient. He is aware of his echo results and recommendations from Ignacia Bayley, NP.

## 2019-07-31 ENCOUNTER — Telehealth: Payer: Self-pay

## 2019-07-31 MED ORDER — ENTRESTO 49-51 MG PO TABS: 1.0000 | ORAL_TABLET | Freq: Two times a day (BID) | ORAL | 0 refills | Status: DC

## 2019-07-31 NOTE — Telephone Encounter (Signed)
Requested Prescriptions   Signed Prescriptions Disp Refills  . sacubitril-valsartan (ENTRESTO) 49-51 MG 60 tablet 0    Sig: Take 1 tablet by mouth 2 (two) times daily.    Authorizing Provider: Minna Merritts    Ordering User: Raelene Bott, Armoni Kludt L

## 2019-08-01 ENCOUNTER — Telehealth: Payer: Self-pay

## 2019-08-01 NOTE — Telephone Encounter (Addendum)
PA started for ENTRESTO- 49-51MG  through [place]  [facility] is reviewing your PA request. Typically an electronic response will be received within 72 hours. To check for an update later, open this request from your dashboard.

## 2019-08-03 ENCOUNTER — Telehealth: Payer: Self-pay

## 2019-08-03 NOTE — Telephone Encounter (Signed)
Spoke with the pharmacy tech at [place] has been approved through 08/02/2020.

## 2019-08-03 NOTE — Telephone Encounter (Signed)
Per covermymeds request denied as clinical documents needed   Please resubmit by going to site   Reference KEY is   [id number]

## 2019-08-03 NOTE — Telephone Encounter (Signed)
Message from [employer] Request Reference Number: [id number].  ENTRESTO TAB 49-51MG  is approved through 08/02/2020.  For further questions, call [phone].

## 2019-08-03 NOTE — Telephone Encounter (Signed)
PA has been resubmitted through covermymeds with documentation.   Steward Ros (Key: Quincy Carnes) Delene Loll 49-51MG  OR TABS

## 2019-08-12 NOTE — Progress Notes (Signed)
Date:  08/13/2019   ID:  [name], DOB [birth year], MRN [id number]  Patient Location:  [place] [address]   Provider location:   [facility], [place] office  PCP:  Lillian  Cardiologist:  Arvid Right [facility]   Chief Complaint  Patient presents with   Other    3 month follow up. Patient denies chest pain and SOB at this time. Meds reviewed verbally with patient.      History of Present Illness:    David Carlson is a 48 y.o. male  past medical history of palpitations,  hypertension,  Hyperlipidemia, obesity,  GERD,  anxiety,  PVCs,  Admitted to the hospital July 2020 for  HFrEF /cardiomyopathy with EF 25% Chronic DVT, pulmonary embolism  03/2019 Who presents for follow-up in the clinic for follow-up of his nonischemic cardiomyopathy and pulmonary embolism, post discharge  Prior cardiac catheterization July 2020 nonobstructive coronary disease, nonischemic cardiomyopathy  Echo July 10, 2019, results discussed with him . Left ventricular ejection fraction, by visual estimation, is 30 to 35%. The left ventricle has moderate to severely decreased function. Normal left ventricular size. There is borderline left ventricular hypertrophy.  2. Left ventricular diastolic Doppler parameters are consistent with impaired relaxation pattern of LV diastolic filling.  3. Global right ventricle has moderately reduced systolic function.The right ventricular size is normal. No increase in right ventricular wall thickness.  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 He was then sent for STAT CTA of the chest to r/o PE. This showed multifocal occlusive segmental to subsegmental bilateral lower lobe emboli with concern for developing pulmonary infarction.  Referred to the emergency room  Ejection fraction 20 to 25%  Cardiac catheterization  with nonobstructive disease  Started on Coreg 6.25 twice daily, Lasix 40 daily,  losartan 25 daily  Shortness of breath, some chest discomfort on inspiration on the right side radiating through to his back felt secondary to pulmonary embolism Started on Xarelto 15 twice daily  While in the hospital Ultrasound performed recently showing gallstones, no significant abdominal pain  He is completed MRCP with and without contrast, results pending   Prior CV studies:   The following studies were reviewed today:    Past Medical History:  Diagnosis Date   Anxiety    Bilateral pulmonary embolism ([place])    a. 03/2019 CTA Chest: multifocal occlusive segmental to subsegmental embolus present bilaterally, most notably in the bilateral lower lobes.  Concern for right middle lobe and right lower lobe pulmonary infarction-->Xarelto initiated.   Broken shoulder    Collar bone fracture    x 2   Erectile dysfunction    GERD (gastroesophageal  reflux disease)    GIB (gastrointestinal bleeding)    a. 03/2019 in setting of Xarelto 15mg  BID after PE dx-->GI rec conservative rx given NICM/PE and stable H/H.   Gout    H/O Bell's palsy    HFrEF (heart failure with reduced ejection fraction) ([place])    a. 03/2019 Echo: EF 20-25%, restrictive  filling. Mildly reduced RV fxn. RVSP 42.5 (also acute PE). Mild to mod MR/TR. Mod LAE, Mild RAE.   Hyperlipidemia    Hypertension    NICM (nonischemic cardiomyopathy) ([place])    a. 03/2019 Echo: EF 20-25%; b. 03/2019 Cath: LM nl, LAD 24m, RI nl, LCX nl, RCA large, 10p.   Obesity    Palpitations    a. Previously evaluted when he was incarcerated in [place] - reportedly nl stress test.   PVC's (premature ventricular contractions)    a. 04/2013 Echo: EF 50-55%, Gr1 DD, mildly dil LA;  b. 04/2017 48hr holter: freq PVCs (5.4%), rare PACs. Single 13 beat atrial run - 143 bpm.   Right leg DVT ([place])    a. 03/2019 LE U/S: Chronic R peroneal DVT.   Past Surgical History:  Procedure Laterality Date   CARPAL TUNNEL RELEASE Bilateral    HERNIA REPAIR  2015   HIP SURGERY     a. Age 56 - failure of growth plate to close - Pins inserted   LEFT HEART CATH AND CORONARY ANGIOGRAPHY N/A 03/27/2019   Procedure: LEFT HEART CATH AND CORONARY ANGIOGRAPHY;  Surgeon: Nelva Bush, MD;  Location: [facility];  Service: Cardiovascular;  Laterality: N/A;     Current Meds  Medication Sig   albuterol (VENTOLIN HFA) 108 (90 Base) MCG/ACT inhaler Inhale 2 puffs into the lungs every 6 (six) hours as needed for wheezing or shortness of breath.   atorvastatin (LIPITOR) 20 MG tablet Take 1 tablet (20 mg total) by mouth daily at 6 PM.   colchicine 0.6 MG tablet Take 0.6 mg by mouth daily as needed.    fluticasone (FLONASE) 50 MCG/ACT nasal spray Place 2 sprays into both nostrils daily.   furosemide (LASIX) 40 MG tablet Take 1 tablet (40 mg total) by mouth daily.   Multiple Vitamin (MULTIVITAMIN WITH MINERALS) TABS tablet Take 1 tablet by mouth daily.   pantoprazole (PROTONIX) 40 MG tablet Take 1 tablet (40 mg total) by mouth daily as needed. Caution:prolonged use may cause health problems; appt needed (Patient taking differently: Take 40 mg by mouth daily as needed (GERD symptoms). Caution:prolonged use  may cause health problems; appt needed)   rivaroxaban (XARELTO) 20 MG TABS tablet Take 1 tablet (20 mg total) by mouth daily.   sacubitril-valsartan (ENTRESTO) 49-51 MG Take 1 tablet by mouth 2 (two) times daily.     Allergies:   Patient has no known allergies.   Social History   Tobacco Use   Smoking status: Former Smoker    Packs/day: 1.00    Years: 20.00    Pack years: 20.00    Types: Cigarettes    Quit date: 09/15/2015    Years since quitting: 3.9   Smokeless tobacco: Never Used  Substance Use Topics   Alcohol use: Yes    Alcohol/week: 0.0 standard drinks    Comment: 3-6 cans/beer on weeknights, 6-12 beers on weekends.   Drug use: No     Family Hx: The patient's family history includes Diabetes in his maternal grandfather, mother, and paternal grandfather; Heart attack in his maternal grandmother; Heart disease in his maternal grandmother; Hypertension  in his father.  ROS:   Please see the history of present illness.    Review of Systems  Constitutional: Negative.   HENT: Negative.   Respiratory: Negative.   Cardiovascular: Negative.   Gastrointestinal: Negative.   Musculoskeletal: Negative.   Neurological: Negative.   Psychiatric/Behavioral: Negative.   All other systems reviewed and are negative.    Labs/Other Tests and Data Reviewed:    Recent Labs: 02/26/2019: TSH 0.82 03/25/2019: Magnesium 1.7 06/20/2019: ALT 24; BUN 21; Creatinine, Ser 1.10; Hemoglobin 12.8; Platelets 219; Potassium 3.5; Sodium 140   Recent Lipid Panel Lab Results  Component Value Date/Time   CHOL 69 03/26/2019 12:22 PM   CHOL 196 01/13/2016 10:39 AM   TRIG 53 03/26/2019 12:22 PM   HDL 12 (L) 03/26/2019 12:22 PM   HDL 35 (L) 01/13/2016 10:39 AM   CHOLHDL 5.8 03/26/2019 12:22 PM   LDLCALC 46 03/26/2019 12:22 PM   LDLCALC 80 02/26/2019 04:39 PM    Wt Readings from Last 3 Encounters:  08/13/19 218 lb (98.9 kg)  06/20/19 224 lb (101.6 kg)  05/10/19 213 lb 8 oz (96.8 kg)      Exam:    Vital Signs:  BP 104/70 (BP Location: Left Arm, Patient Position: Sitting, Cuff Size: Normal)    Pulse (!) 101    Ht 5' 10.5" (1.791 m)    Wt 218 lb (98.9 kg)    BMI 30.84 kg/m  Constitutional:  oriented to person, place, and time. No distress.  HENT:  Head: Grossly normal Eyes:  no discharge. No scleral icterus.  Neck: No JVD, no carotid bruits  Cardiovascular: Regular rate and rhythm, no murmurs appreciated Pulmonary/Chest: Clear to auscultation bilaterally, no wheezes or rails Abdominal: Soft.  no distension.  no tenderness.  Musculoskeletal: Normal range of motion Neurological:  normal muscle tone. Coordination normal. No atrophy Skin: Skin warm and dry Psychiatric: normal affect, pleasant  ASSESSMENT & PLAN:    Problem List Items Addressed This Visit      Cardiology Problems   Cardiomyopathy [facility])   Pulmonary embolism ([place])    Other Visit Diagnoses    Chronic systolic heart failure ([place])    -  Primary   NICM (nonischemic cardiomyopathy) ([place])       Mixed hyperlipidemia       Gastroesophageal reflux disease          bilateral PE predominantly in the lower lung fields Tolerating Xarelto, likely needs 6 months Followed by hematology oncology  Nonischemic cardiomyopathy Continue Entresto Coreg Lasix Blood pressure low, we will not make any medication changes at this time Appears euvolemic, discussed he could take extra Lasix after lunch for any abdominal swelling weight gain Feels he is at his dry weight 218 pounds on today's visit  Lab work reviewed, potassium low 3.5 We will start potassium 10 daily   Total encounter time more than 25 minutes  Greater than 50% was spent in counseling and coordination of care with the patient   Signed, Ida Rogue, [place] [address], [place], [place] [address]

## 2019-08-13 ENCOUNTER — Other Ambulatory Visit: Payer: Self-pay

## 2019-08-13 ENCOUNTER — Ambulatory Visit (INDEPENDENT_AMBULATORY_CARE_PROVIDER_SITE_OTHER): Payer: 59 | Admitting: Cardiovascular Disease

## 2019-08-13 VITALS — BP 104/70 | HR 101 | Ht 70.5 in | Wt 218.0 lb

## 2019-08-13 DIAGNOSIS — I2609 Other pulmonary embolism with acute cor pulmonale: Secondary | ICD-10-CM | POA: Diagnosis not present

## 2019-08-13 DIAGNOSIS — K219 Gastro-esophageal reflux disease without esophagitis: Secondary | ICD-10-CM

## 2019-08-13 DIAGNOSIS — I420 Dilated cardiomyopathy: Secondary | ICD-10-CM

## 2019-08-13 DIAGNOSIS — I428 Other cardiomyopathies: Secondary | ICD-10-CM | POA: Diagnosis not present

## 2019-08-13 DIAGNOSIS — E782 Mixed hyperlipidemia: Secondary | ICD-10-CM

## 2019-08-13 DIAGNOSIS — I5022 Chronic systolic (congestive) heart failure: Secondary | ICD-10-CM

## 2019-08-13 MED ORDER — ENTRESTO 49-51 MG PO TABS: 1.0000 | ORAL_TABLET | Freq: Two times a day (BID) | ORAL | 3 refills | Status: DC

## 2019-08-13 MED ORDER — RIVAROXABAN 20 MG PO TABS: 20.0000 mg | ORAL_TABLET | Freq: Every day | ORAL | 0 refills | Status: DC

## 2019-08-13 MED ORDER — FUROSEMIDE 40 MG PO TABS: 40.0000 mg | ORAL_TABLET | Freq: Every day | ORAL | 3 refills | Status: DC

## 2019-08-13 MED ORDER — POTASSIUM CHLORIDE ER 10 MEQ PO TBCR: 10.0000 meq | EXTENDED_RELEASE_TABLET | Freq: Every day | ORAL | 3 refills | Status: DC

## 2019-08-13 MED ORDER — ATORVASTATIN CALCIUM 20 MG PO TABS: 20.0000 mg | ORAL_TABLET | Freq: Every day | ORAL | 3 refills | Status: DC

## 2019-08-13 MED ORDER — PANTOPRAZOLE SODIUM 40 MG PO TBEC: DELAYED_RELEASE_TABLET | ORAL | 2 refills | Status: DC

## 2019-08-13 MED ORDER — CARVEDILOL 6.25 MG PO TABS: 6.2500 mg | ORAL_TABLET | Freq: Two times a day (BID) | ORAL | 3 refills | Status: DC

## 2019-08-13 NOTE — Patient Instructions (Addendum)
Medication Instructions:  Your physician has recommended you make the following change in your medication:  1. START Potassium 10 mEq once a day   If you need a refill on your cardiac medications before your next appointment, please call your pharmacy.    Lab work: No new labs needed   If you have labs (blood work) drawn today and your tests are completely normal, you will receive your results only by: Marland Kitchen MyChart Message (if you have MyChart) OR . A paper copy in the mail If you have any lab test that is abnormal or we need to change your treatment, we will call you to review the results.   Testing/Procedures: No new testing needed   Follow-Up: At [facility], you and your health needs are our priority.  As part of our continuing mission to provide you with exceptional heart care, we have created designated Provider Care Teams.  These Care Teams include your primary Cardiologist (physician) and Advanced Practice Providers (APPs -  Physician Assistants and Nurse Practitioners) who all work together to provide you with the care you need, when you need it.  . You will need a follow up appointment in 6 months   . Providers on your designated Care Team:   . Murray Hodgkins, NP . Christell Faith, PA-C . Marrianne Mood, PA-C  Any Other Special Instructions Will Be Listed Below (If Applicable).  For educational health videos Log in to : [email or web address] Or : [email or web address], password : [email or web address]

## 2019-09-17 ENCOUNTER — Inpatient Hospital Stay: Payer: 59 | Attending: Oncology

## 2019-09-17 ENCOUNTER — Other Ambulatory Visit: Payer: Self-pay

## 2019-09-17 DIAGNOSIS — M109 Gout, unspecified: Secondary | ICD-10-CM | POA: Insufficient documentation

## 2019-09-17 DIAGNOSIS — I82509 Chronic embolism and thrombosis of unspecified deep veins of unspecified lower extremity: Secondary | ICD-10-CM | POA: Insufficient documentation

## 2019-09-17 DIAGNOSIS — I2609 Other pulmonary embolism with acute cor pulmonale: Secondary | ICD-10-CM

## 2019-09-17 DIAGNOSIS — E785 Hyperlipidemia, unspecified: Secondary | ICD-10-CM | POA: Insufficient documentation

## 2019-09-17 DIAGNOSIS — Z7901 Long term (current) use of anticoagulants: Secondary | ICD-10-CM | POA: Insufficient documentation

## 2019-09-17 DIAGNOSIS — I82551 Chronic embolism and thrombosis of right peroneal vein: Secondary | ICD-10-CM

## 2019-09-17 DIAGNOSIS — Z86711 Personal history of pulmonary embolism: Secondary | ICD-10-CM | POA: Diagnosis not present

## 2019-09-17 DIAGNOSIS — I5022 Chronic systolic (congestive) heart failure: Secondary | ICD-10-CM

## 2019-09-17 DIAGNOSIS — I110 Hypertensive heart disease with heart failure: Secondary | ICD-10-CM | POA: Diagnosis not present

## 2019-09-17 DIAGNOSIS — Z87891 Personal history of nicotine dependence: Secondary | ICD-10-CM | POA: Insufficient documentation

## 2019-09-17 DIAGNOSIS — Z79899 Other long term (current) drug therapy: Secondary | ICD-10-CM | POA: Diagnosis not present

## 2019-09-17 DIAGNOSIS — K219 Gastro-esophageal reflux disease without esophagitis: Secondary | ICD-10-CM | POA: Diagnosis not present

## 2019-09-17 LAB — COMPREHENSIVE METABOLIC PANEL
ALT: 28 U/L (ref 0–44)
AST: 26 U/L (ref 15–41)
Albumin: 4 g/dL (ref 3.5–5.0)
Alkaline Phosphatase: 50 U/L (ref 38–126)
Anion gap: 9 (ref 5–15)
BUN: 14 mg/dL (ref 6–20)
CO2: 28 mmol/L (ref 22–32)
Calcium: 9.1 mg/dL (ref 8.9–10.3)
Chloride: 102 mmol/L (ref 98–111)
Creatinine, Ser: 1.02 mg/dL (ref 0.61–1.24)
GFR calc Af Amer: >60 mL/min (ref >60)
GFR calc non Af Amer: >60 mL/min (ref >60)
Glucose, Bld: 110 mg/dL — ABNORMAL HIGH (ref 70–99)
Potassium: 3.7 mmol/L (ref 3.5–5.1)
Sodium: 139 mmol/L (ref 135–145)
Total Bilirubin: 1.1 mg/dL (ref 0.3–1.2)
Total Protein: 7.5 g/dL (ref 6.5–8.1)

## 2019-09-17 LAB — CBC WITH DIFFERENTIAL/PLATELET
Abs Immature Granulocytes: 0.02 10*3/uL (ref 0.00–0.07)
Basophils Absolute: 0 10*3/uL (ref 0.0–0.1)
Basophils Relative: 1 %
Eosinophils Absolute: 0.1 10*3/uL (ref 0.0–0.5)
Eosinophils Relative: 2 %
HCT: 44.6 % (ref 39.0–52.0)
Hemoglobin: 15 g/dL (ref 13.0–17.0)
Immature Granulocytes: 0 %
Lymphocytes Relative: 23 %
Lymphs Abs: 1.4 10*3/uL (ref 0.7–4.0)
MCH: 32.3 pg (ref 26.0–34.0)
MCHC: 33.6 g/dL (ref 30.0–36.0)
MCV: 96.1 fL (ref 80.0–100.0)
Monocytes Absolute: 0.7 10*3/uL (ref 0.1–1.0)
Monocytes Relative: 11 %
Neutro Abs: 4 10*3/uL (ref 1.7–7.7)
Neutrophils Relative %: 63 %
Platelets: 171 10*3/uL (ref 150–400)
RBC: 4.64 MIL/uL (ref 4.22–5.81)
RDW: 12.8 % (ref 11.5–15.5)
WBC: 6.3 10*3/uL (ref 4.0–10.5)
nRBC: 0 % (ref 0.0–0.2)

## 2019-09-17 LAB — FERRITIN: Ferritin: 81 ng/mL (ref 24–336)

## 2019-09-18 ENCOUNTER — Inpatient Hospital Stay (HOSPITAL_BASED_OUTPATIENT_CLINIC_OR_DEPARTMENT_OTHER): Payer: 59 | Admitting: Oncology

## 2019-09-18 ENCOUNTER — Encounter: Payer: Self-pay | Admitting: Oncology

## 2019-09-18 ENCOUNTER — Inpatient Hospital Stay: Payer: 59

## 2019-09-18 DIAGNOSIS — I82551 Chronic embolism and thrombosis of right peroneal vein: Secondary | ICD-10-CM

## 2019-09-18 DIAGNOSIS — Z86711 Personal history of pulmonary embolism: Secondary | ICD-10-CM | POA: Diagnosis not present

## 2019-09-18 MED ORDER — RIVAROXABAN 10 MG PO TABS: 10.0000 mg | ORAL_TABLET | Freq: Every day | ORAL | 1 refills | Status: DC

## 2019-09-18 NOTE — Progress Notes (Signed)
Patient verified using two identifiers for virtual visit via telephone today.  Patient does not offer any problems today.  

## 2019-09-18 NOTE — Progress Notes (Signed)
HEMATOLOGY-ONCOLOGY TeleHEALTH VISIT PROGRESS NOTE  I connected with David Carlson on 09/18/19 at 10:30 AM EST by video enabled telemedicine visit and verified that I am speaking with the correct person using two identifiers. I discussed the limitations, risks, security and privacy concerns of performing an evaluation and management service by telemedicine and the availability of in-person appointments. I also discussed with the patient that there may be a patient responsible charge related to this service. The patient expressed understanding and agreed to proceed.   Other persons participating in the visit and their role in the encounter:  None  Patient's location: Home  Provider's location: office Chief Complaint: Pulmonary embolism follow-up   INTERVAL HISTORY David Carlson is a 49 y.o. male who has above history reviewed by me today presents for follow up visit for management of history of pulmonary embolism and chronic lower extremity DVT. Problems and complaints are listed below:  Patient reports being compliant on Xarelto 20 mg daily. Denies any bleeding events. Denies any shortness of breath, hemoptysis, lower extremity swelling. He has no new complaints today. I attempted to connect the patient for visual enabled telehealth visit via Doximity.  Due to the technical difficulties with video,  Patient was transitioned to audio only visit.  Review of Systems  Constitutional: Negative for appetite change, chills, fatigue, fever and unexpected weight change.  HENT:   Negative for hearing loss and voice change.   Eyes: Negative for eye problems and icterus.  Respiratory: Negative for chest tightness, cough and shortness of breath.   Cardiovascular: Negative for chest pain and leg swelling.  Gastrointestinal: Negative for abdominal distention and abdominal pain.  Endocrine: Negative for hot flashes.  Genitourinary: Negative for difficulty urinating, dysuria and frequency.    Musculoskeletal: Negative for arthralgias.  Skin: Negative for itching and rash.  Neurological: Negative for light-headedness and numbness.  Hematological: Negative for adenopathy. Does not bruise/bleed easily.  Psychiatric/Behavioral: Negative for confusion.    Past Medical History:  Diagnosis Date  . Anxiety   . Bilateral pulmonary embolism ([place])    a. 03/2019 CTA Chest: multifocal occlusive segmental to subsegmental embolus present bilaterally, most notably in the bilateral lower lobes.  Concern for right middle lobe and right lower lobe pulmonary infarction-->Xarelto initiated.  . Broken shoulder   . Collar bone fracture    x 2  . Erectile dysfunction   . GERD (gastroesophageal reflux disease)   . GIB (gastrointestinal bleeding)    a. 03/2019 in setting of Xarelto 15mg  BID after PE dx-->GI rec conservative rx given NICM/PE and stable H/H.  Marland Kitchen Gout   . H/O Bell's palsy   . HFrEF (heart failure with reduced ejection fraction) ([place])    a. 03/2019 Echo: EF 20-25%, restrictive filling. Mildly reduced RV fxn. RVSP 42.5 (also acute PE). Mild to mod MR/TR. Mod LAE, Mild RAE.  Marland Kitchen Hyperlipidemia   . Hypertension   . NICM (nonischemic cardiomyopathy) ([place])    a. 03/2019 Echo: EF 20-25%; b. 03/2019 Cath: LM nl, LAD 12m, RI nl, LCX nl, RCA large, 10p.  . Obesity   . Palpitations    a. Previously evaluted when he was incarcerated in [place] - reportedly nl stress test.  . PVC's (premature ventricular contractions)    a. 04/2013 Echo: EF 50-55%, Gr1 DD, mildly dil LA;  b. 04/2017 48hr holter: freq PVCs (5.4%), rare PACs. Single 13 beat atrial run - 143 bpm.  . Right leg DVT ([place])    a. 03/2019 LE U/S:  Chronic R peroneal DVT.   Past Surgical History:  Procedure Laterality Date  . CARPAL TUNNEL RELEASE Bilateral   . HERNIA REPAIR  2015  . HIP SURGERY     a. Age 51 - failure of growth plate to close - Pins inserted  . LEFT HEART CATH AND CORONARY ANGIOGRAPHY N/A 03/27/2019   Procedure: LEFT HEART CATH  AND CORONARY ANGIOGRAPHY;  Surgeon: Nelva Bush, MD;  Location: [facility];  Service: Cardiovascular;  Laterality: N/A;    Family History  Problem Relation Age of Onset  . Diabetes Mother   . Hypertension Father   . Heart disease Maternal Grandmother   . Heart attack Maternal Grandmother   . Diabetes Maternal Grandfather   . Diabetes Paternal Grandfather     Social History   Socioeconomic History  . Marital status: Single    Spouse name: Not on file  . Number of children: 2  . Years of education: 64  . Highest education level: High school graduate  Occupational History  . Not on file  Tobacco Use  . Smoking status: Former Smoker    Packs/day: 1.00    Years: 20.00    Pack years: 20.00    Types: Cigarettes    Quit date: 09/15/2015    Years since quitting: 4.0  . Smokeless tobacco: Never Used  Substance and Sexual Activity  . Alcohol use: Yes    Alcohol/week: 0.0 standard drinks    Comment: 3-6 cans/beer on weeknights, 6-12 beers on weekends.  . Drug use: No  . Sexual activity: Yes  Other Topics Concern  . Not on file  Social History Narrative   Lives in [place] with his ex-girlfriend's brother.  Works @ [employer] in [employer].  Active @ work w/o limitations but does not routinely exercise.   Social Determinants of Health   Financial Resource Strain: Low Risk   . Difficulty of Paying Living Expenses: Not hard at all  Food Insecurity: No Food Insecurity  . Worried About Charity fundraiser in the Last Year: Never true  . Ran Out of Food in the Last Year: Never true  Transportation Needs: Unknown  . Lack of Transportation (Medical): No  . Lack of Transportation (Non-Medical): Not on file  Physical Activity: Inactive  . Days of Exercise per Week: 0 days  . Minutes of Exercise per Session: 0 min  Stress: No Stress Concern Present  . Feeling of Stress : Not at all  Social Connections: Moderately Isolated  . Frequency of Communication with  Friends and Family: More than three times a week  . Frequency of Social Gatherings with Friends and Family: Once a week  . Attends Religious Services: Never  . Active Member of Clubs or Organizations: No  . Attends Archivist Meetings: Never  . Marital Status: Divorced  Human resources officer Violence: Unknown  . Fear of Current or Ex-Partner: Patient refused  . Emotionally Abused: Patient refused  . Physically Abused: Patient refused  . Sexually Abused: Patient refused    Current Outpatient Medications on File Prior to Visit  Medication Sig Dispense Refill  . albuterol (VENTOLIN HFA) 108 (90 Base) MCG/ACT inhaler Inhale 2 puffs into the lungs every 6 (six) hours as needed for wheezing or shortness of breath. 1 Inhaler 0  . atorvastatin (LIPITOR) 20 MG tablet Take 1 tablet (20 mg total) by mouth daily at 6 PM. 90 tablet 3  . carvedilol (COREG) 6.25 MG tablet Take 1 tablet (6.25 mg total) by  mouth 2 (two) times daily with a meal. 180 tablet 3  . colchicine 0.6 MG tablet Take 0.6 mg by mouth daily as needed.     . fluticasone (FLONASE) 50 MCG/ACT nasal spray Place 2 sprays into both nostrils daily. 16 g 6  . furosemide (LASIX) 40 MG tablet Take 1 tablet (40 mg total) by mouth daily. 90 tablet 3  . Multiple Vitamin (MULTIVITAMIN WITH MINERALS) TABS tablet Take 1 tablet by mouth daily.    . pantoprazole (PROTONIX) 40 MG tablet Once daily 90 tablet 2  . potassium chloride (KLOR-CON) 10 MEQ tablet Take 1 tablet (10 mEq total) by mouth daily. 90 tablet 3  . rivaroxaban (XARELTO) 20 MG TABS tablet Take 1 tablet (20 mg total) by mouth daily. 90 tablet 0  . sacubitril-valsartan (ENTRESTO) 49-51 MG Take 1 tablet by mouth 2 (two) times daily. 180 tablet 3   No current facility-administered medications on file prior to visit.    No Known Allergies     Observations/Objective: Today's Vitals   09/18/19 1021  PainSc: 0-No pain   There is no height or weight on file to calculate BMI.   Physical Exam  Constitutional: No distress.  Neurological: He is alert.  Psychiatric: Mood normal.    CBC    Component Value Date/Time   WBC 6.3 09/17/2019 1417   RBC 4.64 09/17/2019 1417   HGB 15.0 09/17/2019 1417   HGB 14.1 10/03/2015 0830   HCT 44.6 09/17/2019 1417   HCT 41.1 10/03/2015 0830   PLT 171 09/17/2019 1417   PLT 237 10/03/2015 0830   MCV 96.1 09/17/2019 1417   MCV 88 10/03/2015 0830   MCV 91 05/27/2014 1425   MCH 32.3 09/17/2019 1417   MCHC 33.6 09/17/2019 1417   RDW 12.8 09/17/2019 1417   RDW 13.4 10/03/2015 0830   RDW 13.3 05/27/2014 1425   LYMPHSABS 1.4 09/17/2019 1417   LYMPHSABS 2.1 10/03/2015 0830   LYMPHSABS 2.1 05/27/2014 1425   MONOABS 0.7 09/17/2019 1417   MONOABS 0.5 05/27/2014 1425   EOSABS 0.1 09/17/2019 1417   EOSABS 0.2 10/03/2015 0830   EOSABS 0.1 05/27/2014 1425   BASOSABS 0.0 09/17/2019 1417   BASOSABS 0.0 10/03/2015 0830   BASOSABS 0.1 05/27/2014 1425    CMP     Component Value Date/Time   NA 139 09/17/2019 1417   NA 141 10/03/2015 0830   NA 138 05/27/2014 1425   K 3.7 09/17/2019 1417   K 3.9 05/27/2014 1425   CL 102 09/17/2019 1417   CL 106 05/27/2014 1425   CO2 28 09/17/2019 1417   CO2 28 05/27/2014 1425   GLUCOSE 110 (H) 09/17/2019 1417   GLUCOSE 96 05/27/2014 1425   BUN 14 09/17/2019 1417   BUN 14 10/03/2015 0830   BUN 13 05/27/2014 1425   CREATININE 1.02 09/17/2019 1417   CREATININE 1.33 02/26/2019 1639   CALCIUM 9.1 09/17/2019 1417   CALCIUM 9.1 05/27/2014 1425   PROT 7.5 09/17/2019 1417   PROT 6.9 10/03/2015 0830   PROT 8.2 01/04/2013 1619   ALBUMIN 4.0 09/17/2019 1417   ALBUMIN 4.3 10/03/2015 0830   ALBUMIN 3.8 01/04/2013 1619   AST 26 09/17/2019 1417   AST 57 (H) 01/04/2013 1619   ALT 28 09/17/2019 1417   ALT 76 01/04/2013 1619   ALKPHOS 50 09/17/2019 1417   ALKPHOS 70 01/04/2013 1619   BILITOT 1.1 09/17/2019 1417   BILITOT 0.4 10/03/2015 0830   BILITOT 0.5 01/04/2013 1619   GFRNONAA >  60 09/17/2019  1417   GFRNONAA 63 02/26/2019 1639   GFRAA >60 09/17/2019 1417   GFRAA 73 02/26/2019 1639     Assessment and Plan: 1. History of pulmonary embolism   2. Chronic deep vein thrombosis (DVT) of right peroneal vein ([place])     Labs reviewed discussed with patient. Patient has been on anticoagulation for pulmonary embolism and has finished about 6 months of anticoagulation. Unprovoked thrombosis in nature, recommend long-term anticoagulation.  I suggest patient to finish with current Xarelto 20 mg daily then switch to 10 mg daily as maintenance.  Follow Up Instructions: 4 months   I discussed the assessment and treatment plan with the patient. The patient was provided an opportunity to ask questions and all were answered. The patient agreed with the plan and demonstrated an understanding of the instructions.  The patient was advised to call back or seek an in-person evaluation if the symptoms worsen or if the condition fails to improve as anticipated.    Earlie Server, MD 09/18/2019 9:39 PM

## 2019-10-08 IMAGING — DX LEFT KNEE - 1-2 VIEW
2 series · 2 of 2 positions shown · non-contrast
Comparison: None.

CLINICAL DATA: LEFT knee pain starting yesterday.  No known injury.

EXAM:
LEFT KNEE - 1-2 VIEW

[knee ap]
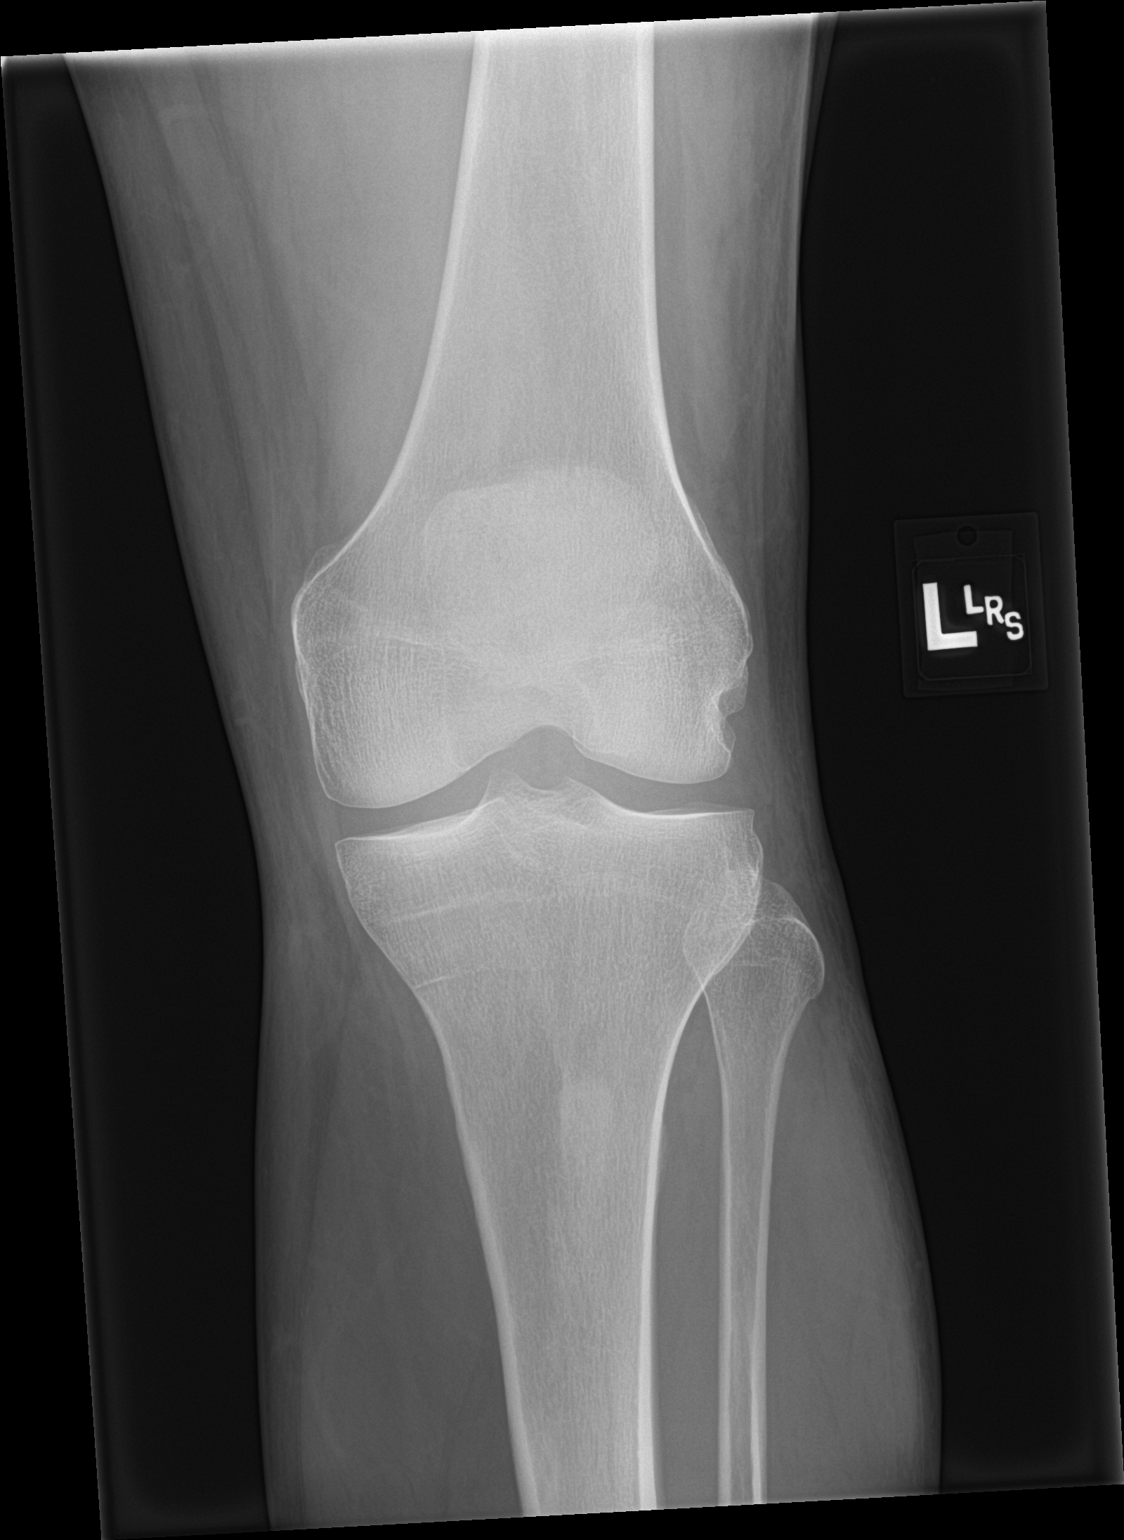

[knee lat]
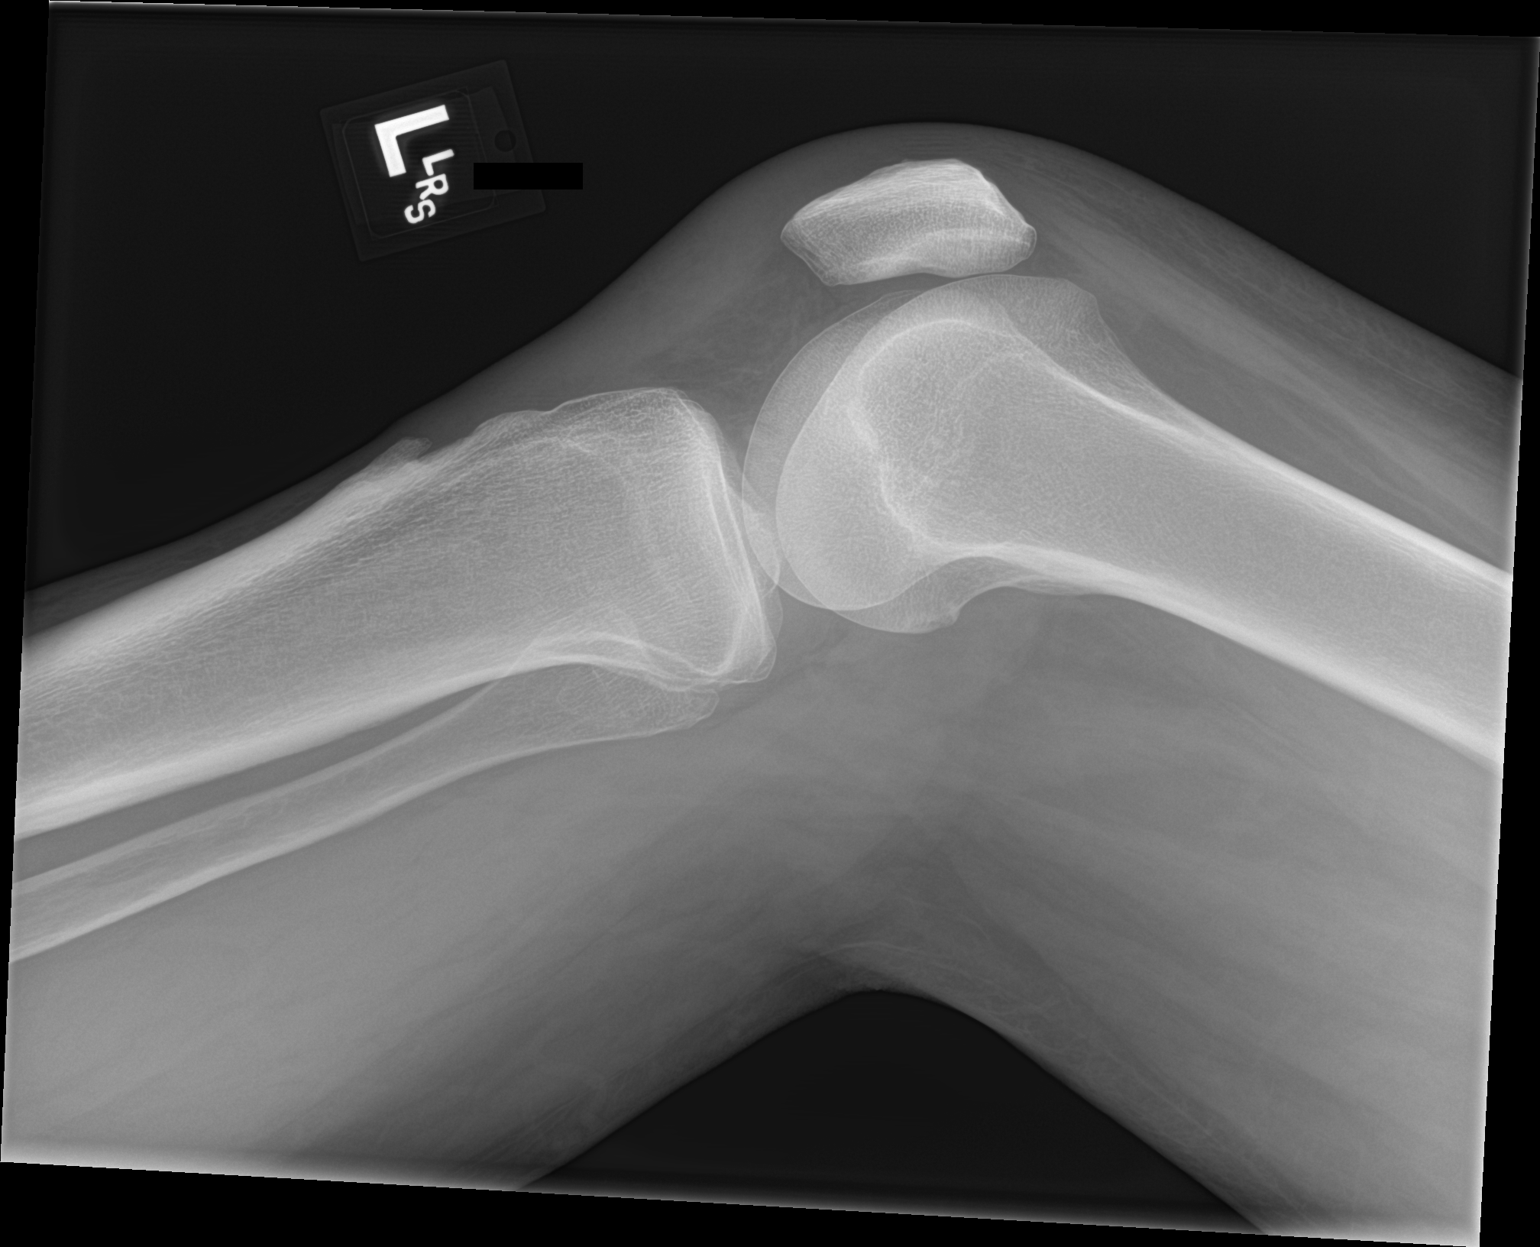

[2 of 2 positions shown; findings below may reference images not displayed]

FINDINGS: No evidence of fracture, dislocation, or joint effusion. No evidence
of arthropathy or other focal bone abnormality. Soft tissues are
unremarkable.
IMPRESSION: Normal plain film of the LEFT knee.

## 2020-01-14 ENCOUNTER — Inpatient Hospital Stay (HOSPITAL_BASED_OUTPATIENT_CLINIC_OR_DEPARTMENT_OTHER): Payer: 59 | Admitting: Oncology

## 2020-01-14 ENCOUNTER — Other Ambulatory Visit: Payer: Self-pay

## 2020-01-14 ENCOUNTER — Inpatient Hospital Stay: Payer: 59 | Attending: Oncology

## 2020-01-14 ENCOUNTER — Encounter: Payer: Self-pay | Admitting: Oncology

## 2020-01-14 VITALS — BP 121/89 | HR 118 | Temp 95.7°F | Resp 18 | Wt 208.3 lb

## 2020-01-14 DIAGNOSIS — I110 Hypertensive heart disease with heart failure: Secondary | ICD-10-CM | POA: Diagnosis not present

## 2020-01-14 DIAGNOSIS — K219 Gastro-esophageal reflux disease without esophagitis: Secondary | ICD-10-CM | POA: Diagnosis not present

## 2020-01-14 DIAGNOSIS — E785 Hyperlipidemia, unspecified: Secondary | ICD-10-CM | POA: Diagnosis not present

## 2020-01-14 DIAGNOSIS — I2510 Atherosclerotic heart disease of native coronary artery without angina pectoris: Secondary | ICD-10-CM | POA: Insufficient documentation

## 2020-01-14 DIAGNOSIS — Z79899 Other long term (current) drug therapy: Secondary | ICD-10-CM | POA: Insufficient documentation

## 2020-01-14 DIAGNOSIS — Z86718 Personal history of other venous thrombosis and embolism: Secondary | ICD-10-CM | POA: Insufficient documentation

## 2020-01-14 DIAGNOSIS — R748 Abnormal levels of other serum enzymes: Secondary | ICD-10-CM | POA: Insufficient documentation

## 2020-01-14 DIAGNOSIS — Z791 Long term (current) use of non-steroidal anti-inflammatories (NSAID): Secondary | ICD-10-CM | POA: Diagnosis not present

## 2020-01-14 DIAGNOSIS — Z86711 Personal history of pulmonary embolism: Secondary | ICD-10-CM

## 2020-01-14 DIAGNOSIS — Z87891 Personal history of nicotine dependence: Secondary | ICD-10-CM | POA: Diagnosis not present

## 2020-01-14 DIAGNOSIS — M109 Gout, unspecified: Secondary | ICD-10-CM | POA: Insufficient documentation

## 2020-01-14 DIAGNOSIS — I5022 Chronic systolic (congestive) heart failure: Secondary | ICD-10-CM | POA: Diagnosis not present

## 2020-01-14 DIAGNOSIS — I82551 Chronic embolism and thrombosis of right peroneal vein: Secondary | ICD-10-CM | POA: Diagnosis not present

## 2020-01-14 DIAGNOSIS — R7989 Other specified abnormal findings of blood chemistry: Secondary | ICD-10-CM | POA: Diagnosis not present

## 2020-01-14 LAB — CBC WITH DIFFERENTIAL/PLATELET
Abs Immature Granulocytes: 0.03 10*3/uL (ref 0.00–0.07)
Basophils Absolute: 0 10*3/uL (ref 0.0–0.1)
Basophils Relative: 1 %
Eosinophils Absolute: 0.1 10*3/uL (ref 0.0–0.5)
Eosinophils Relative: 2 %
HCT: 43.4 % (ref 39.0–52.0)
Hemoglobin: 14.9 g/dL (ref 13.0–17.0)
Immature Granulocytes: 1 %
Lymphocytes Relative: 29 %
Lymphs Abs: 1.9 10*3/uL (ref 0.7–4.0)
MCH: 33.5 pg (ref 26.0–34.0)
MCHC: 34.3 g/dL (ref 30.0–36.0)
MCV: 97.5 fL (ref 80.0–100.0)
Monocytes Absolute: 0.9 10*3/uL (ref 0.1–1.0)
Monocytes Relative: 13 %
Neutro Abs: 3.7 10*3/uL (ref 1.7–7.7)
Neutrophils Relative %: 54 %
Platelets: 167 10*3/uL (ref 150–400)
RBC: 4.45 MIL/uL (ref 4.22–5.81)
RDW: 12.2 % (ref 11.5–15.5)
WBC: 6.7 10*3/uL (ref 4.0–10.5)
nRBC: 0 % (ref 0.0–0.2)

## 2020-01-14 LAB — COMPREHENSIVE METABOLIC PANEL
ALT: 43 U/L (ref 0–44)
AST: 41 U/L (ref 15–41)
Albumin: 4.5 g/dL (ref 3.5–5.0)
Alkaline Phosphatase: 49 U/L (ref 38–126)
Anion gap: 13 (ref 5–15)
BUN: 17 mg/dL (ref 6–20)
CO2: 30 mmol/L (ref 22–32)
Calcium: 9.7 mg/dL (ref 8.9–10.3)
Chloride: 99 mmol/L (ref 98–111)
Creatinine, Ser: 1.38 mg/dL — ABNORMAL HIGH (ref 0.61–1.24)
GFR calc Af Amer: >60 mL/min (ref >60)
GFR calc non Af Amer: >60 mL/min (ref >60)
Glucose, Bld: 118 mg/dL — ABNORMAL HIGH (ref 70–99)
Potassium: 4 mmol/L (ref 3.5–5.1)
Sodium: 142 mmol/L (ref 135–145)
Total Bilirubin: 1.6 mg/dL — ABNORMAL HIGH (ref 0.3–1.2)
Total Protein: 7.7 g/dL (ref 6.5–8.1)

## 2020-01-14 MED ORDER — RIVAROXABAN 10 MG PO TABS: 10.0000 mg | ORAL_TABLET | Freq: Every day | ORAL | 1 refills | Status: DC

## 2020-01-14 NOTE — Progress Notes (Signed)
Hematology/Oncology Consult note [facility] [phone] [phone]   Patient Care Team: [place] as PCP - General (Family Medicine) Rockey Situ, Kathlene November, MD as PCP - Cardiology (Cardiology)  REFERRING PROVIDER: [facility]  CHIEF COMPLAINTS/REASON FOR VISIT:  Follow up  of bilateral pulmonary embolism.  HISTORY OF PRESENTING ILLNESS:   David Carlson is a  49 y.o.  male with PMH listed below was seen in consultation at the request of  [place]*  for evaluation of bilateral pulmonary embolism. Patient was admitted from 03/22/2021 03/28/2019 due to acute bilateral pulmonary embolism. Patient is initially presented to emergency room complaining shortness of breath, ongoing for the past 6 months.  Also associated with left-sided chest pain. 03/23/2019 CT angiogram chest PE protocol was independently reviewed by me. Images revealed multifocal occlusive segmental to subsegmental pulmonary embolism bilaterally, pulmonary infarction, pleural effusion and a diffuse bilateral interlobular septal thickening likely edema.  Cardiomegaly. 03/23/2019 [place] lower extremity venous Doppler showed chronic DVT involving right peroneal veins. Hypercoagulable work-up was sent off. Patient was started on heparin drip and is switched to oral Xarelto at discharge. Patient has CT evidence of heart strain.  Evaluated by cardiology and also had left heart cath with findings of mild nonobstructive coronary artery disease.  Findings are consistent with nonischemic cardiomyopathy.  Severely elevated left ventricular filling pressure.  2D echo showed acute heart failure 20 to 25% ejection fraction.  Patient takes Coreg 6.25 twice daily, Lasix 40 mg daily Entresto 24/26 mg, he had a virtual visit with Dr. Rockey Situ on 04/04/2019.  Hypercoagulable work-up negative including cardiolipin antibodies, prothrombin gene mutation, factor V  Leiden mutation, homocystine, Antithrombin, beta-2 glycoprotein, lupus anticoagulant panel, total protein S Patient has mildly decreased protein as activity, total protein C and protein C activity.  Patient was discharged on 03/28/2019 and presented back to emergency room on 03/30/2019 after having 2 bloody bowel movements.  Patient was seen by gastroenterology Dr. Bonna Gains, given his recent CHF, bilateral PE/high risk of endoscopy procedures, suggest to manage conservatively..  Patient's Xarelto 20 mg daily was held for a few days and restarted.  Patient reports that no additional bleeding events.  Elevated liver enzymes- improved from last hospitalization.RUQ ultrasound7/14showed cholelithiasis and gallbladder wall thickening. Surgery recommended outpatient follow-up.  Today he reports tolerating anticoagulation with Xarelto well.  On 20 mg daily.Denies hematochezia, hematuria, hematemesis, epistaxis, black tarry stool or easy bruising.  Reports breathing is better.still has sob with exertion. Fatigue  Denies any chest pain today.  He drinks alcohol some days.  Beer or hard liquor.  INTERVAL HISTORY David Carlson is a 48 y.o. male who has above history reviewed by me today presents for follow up visit for management of unprovoked pulmonary embolism. Problems and complaints are listed below: Patient has been on Xarelto 10 mg daily.  He is on Lasix for CHF. Recently he had severe gout flare and was started on colchicine and diclofenac 75 mg daily. He denies any breathing difficulties.  Mild shortness of breath with exertion.  Review of Systems  Constitutional: Negative for appetite change, chills, fatigue, fever and unexpected weight change.  HENT:   Negative for hearing loss and voice change.   Eyes: Negative for eye problems and icterus.  Respiratory: Negative for chest tightness and cough.        Mild SOB with exertion  Cardiovascular: Negative for chest pain and leg swelling.    Gastrointestinal: Negative for abdominal distention and  abdominal pain.  Endocrine: Negative for hot flashes.  Genitourinary: Negative for difficulty urinating, dysuria and frequency.   Musculoskeletal: Negative for arthralgias.  Skin: Negative for itching and rash.  Neurological: Negative for light-headedness and numbness.  Hematological: Negative for adenopathy. Does not bruise/bleed easily.  Psychiatric/Behavioral: Negative for confusion.    MEDICAL HISTORY:  Past Medical History:  Diagnosis Date  . Anxiety   . Bilateral pulmonary embolism ([place])    a. 03/2019 CTA Chest: multifocal occlusive segmental to subsegmental embolus present bilaterally, most notably in the bilateral lower lobes.  Concern for right middle lobe and right lower lobe pulmonary infarction-->Xarelto initiated.  . Broken shoulder   . Collar bone fracture    x 2  . Erectile dysfunction   . GERD (gastroesophageal reflux disease)   . GIB (gastrointestinal bleeding)    a. 03/2019 in setting of Xarelto 15mg  BID after PE dx-->GI rec conservative rx given NICM/PE and stable H/H.  Marland Kitchen Gout   . H/O Bell's palsy   . HFrEF (heart failure with reduced ejection fraction) ([place])    a. 03/2019 Echo: EF 20-25%, restrictive filling. Mildly reduced RV fxn. RVSP 42.5 (also acute PE). Mild to mod MR/TR. Mod LAE, Mild RAE.  Marland Kitchen Hyperlipidemia   . Hypertension   . NICM (nonischemic cardiomyopathy) ([place])    a. 03/2019 Echo: EF 20-25%; b. 03/2019 Cath: LM nl, LAD 40m, RI nl, LCX nl, RCA large, 10p.  . Obesity   . Palpitations    a. Previously evaluted when he was incarcerated in [place] - reportedly nl stress test.  . PVC's (premature ventricular contractions)    a. 04/2013 Echo: EF 50-55%, Gr1 DD, mildly dil LA;  b. 04/2017 48hr holter: freq PVCs (5.4%), rare PACs. Single 13 beat atrial run - 143 bpm.  . Right leg DVT ([place])    a. 03/2019 LE U/S: Chronic R peroneal DVT.    SURGICAL HISTORY: Past Surgical History:  Procedure Laterality Date  .  CARPAL TUNNEL RELEASE Bilateral   . HERNIA REPAIR  2015  . HIP SURGERY     a. Age 53 - failure of growth plate to close - Pins inserted  . LEFT HEART CATH AND CORONARY ANGIOGRAPHY N/A 03/27/2019   Procedure: LEFT HEART CATH AND CORONARY ANGIOGRAPHY;  Surgeon: Nelva Bush, MD;  Location: [facility];  Service: Cardiovascular;  Laterality: N/A;    SOCIAL HISTORY: Social History   Socioeconomic History  . Marital status: Single    Spouse name: Not on file  . Number of children: 2  . Years of education: 8  . Highest education level: High school graduate  Occupational History  . Not on file  Tobacco Use  . Smoking status: Former Smoker    Packs/day: 1.00    Years: 20.00    Pack years: 20.00    Types: Cigarettes    Quit date: 09/15/2015    Years since quitting: 4.3  . Smokeless tobacco: Never Used  Substance and Sexual Activity  . Alcohol use: Yes    Alcohol/week: 0.0 standard drinks    Comment: 3-6 cans/beer on weeknights, 6-12 beers on weekends.  . Drug use: No  . Sexual activity: Yes  Other Topics Concern  . Not on file  Social History Narrative   Lives in [place] with his ex-girlfriend's brother.  Works @ [employer] in [employer].  Active @ work w/o limitations but does not routinely exercise.   Social Determinants of Health   Financial Resource Strain: Low Risk   .  Difficulty of Paying Living Expenses: Not hard at all  Food Insecurity: No Food Insecurity  . Worried About Charity fundraiser in the Last Year: Never true  . Ran Out of Food in the Last Year: Never true  Transportation Needs: Unknown  . Lack of Transportation (Medical): No  . Lack of Transportation (Non-Medical): Not on file  Physical Activity: Inactive  . Days of Exercise per Week: 0 days  . Minutes of Exercise per Session: 0 min  Stress: No Stress Concern Present  . Feeling of Stress : Not at all  Social Connections: Moderately Isolated  . Frequency of Communication with  Friends and Family: More than three times a week  . Frequency of Social Gatherings with Friends and Family: Once a week  . Attends Religious Services: Never  . Active Member of Clubs or Organizations: No  . Attends Archivist Meetings: Never  . Marital Status: Divorced  Human resources officer Violence: Unknown  . Fear of Current or Ex-Partner: Patient refused  . Emotionally Abused: Patient refused  . Physically Abused: Patient refused  . Sexually Abused: Patient refused    FAMILY HISTORY: Family History  Problem Relation Age of Onset  . Diabetes Mother   . Hypertension Father   . Heart disease Maternal Grandmother   . Heart attack Maternal Grandmother   . Diabetes Maternal Grandfather   . Diabetes Paternal Grandfather     ALLERGIES:  has No Known Allergies.  MEDICATIONS:  Current Outpatient Medications  Medication Sig Dispense Refill  . albuterol (VENTOLIN HFA) 108 (90 Base) MCG/ACT inhaler Inhale 2 puffs into the lungs every 6 (six) hours as needed for wheezing or shortness of breath. 1 Inhaler 0  . atorvastatin (LIPITOR) 20 MG tablet Take 1 tablet (20 mg total) by mouth daily at 6 PM. 90 tablet 3  . carvedilol (COREG) 6.25 MG tablet Take 1 tablet (6.25 mg total) by mouth 2 (two) times daily with a meal. 180 tablet 3  . colchicine 0.6 MG tablet Take 0.6 mg by mouth daily as needed.     . diclofenac (VOLTAREN) 75 MG EC tablet Take by mouth.    . fluticasone (FLONASE) 50 MCG/ACT nasal spray Place 2 sprays into both nostrils daily. 16 g 6  . furosemide (LASIX) 40 MG tablet Take 1 tablet (40 mg total) by mouth daily. 90 tablet 3  . Multiple Vitamin (MULTIVITAMIN WITH MINERALS) TABS tablet Take 1 tablet by mouth daily.    . pantoprazole (PROTONIX) 40 MG tablet Once daily 90 tablet 2  . potassium chloride (KLOR-CON) 10 MEQ tablet Take 1 tablet (10 mEq total) by mouth daily. 90 tablet 3  . rivaroxaban (XARELTO) 10 MG TABS tablet Take 1 tablet (10 mg total) by mouth daily. 90  tablet 1  . sacubitril-valsartan (ENTRESTO) 49-51 MG Take 1 tablet by mouth 2 (two) times daily. 180 tablet 3  . rivaroxaban (XARELTO) 20 MG TABS tablet Take 1 tablet (20 mg total) by mouth daily. (Patient not taking: Reported on 01/14/2020) 90 tablet 0   No current facility-administered medications for this visit.     PHYSICAL EXAMINATION: ECOG PERFORMANCE STATUS: 1 - Symptomatic but completely ambulatory Vitals:   01/14/20 1303  BP: 121/89  Pulse: (!) 118  Resp: 18  Temp: (!) 95.7 F (35.4 C)   Filed Weights   01/14/20 1303  Weight: 208 lb 4.8 oz (94.5 kg)    Physical Exam Constitutional:      General: He is not in acute distress.  HENT:     Head: Normocephalic and atraumatic.  Eyes:     General: No scleral icterus.    Pupils: Pupils are equal, round, and reactive to light.  Cardiovascular:     Rate and Rhythm: Normal rate and regular rhythm.     Heart sounds: Normal heart sounds.  Pulmonary:     Effort: Pulmonary effort is normal. No respiratory distress.     Breath sounds: No wheezing.  Abdominal:     General: Bowel sounds are normal. There is no distension.     Palpations: Abdomen is soft. There is no mass.     Tenderness: There is no abdominal tenderness.  Musculoskeletal:        General: No swelling or deformity. Normal range of motion.     Cervical back: Normal range of motion and neck supple.  Skin:    General: Skin is warm and dry.     Findings: No erythema or rash.     Comments: Tattoo.   Neurological:     Mental Status: He is alert and oriented to person, place, and time.     Cranial Nerves: No cranial nerve deficit.     Coordination: Coordination normal.  Psychiatric:        Behavior: Behavior normal.        Thought Content: Thought content normal.     LABORATORY DATA:  I have reviewed the data as listed Lab Results  Component Value Date   WBC 6.7 01/14/2020   HGB 14.9 01/14/2020   HCT 43.4 01/14/2020   MCV 97.5 01/14/2020   PLT 167  01/14/2020   Recent Labs    03/27/19 1736 03/28/19 0000 03/28/19 0700 03/30/19 1930 04/11/19 1054 04/11/19 1054 05/10/19 1037 06/20/19 0934 09/17/19 1417  NA  --    < >  --    < > 139   < > 141 140 139  K  --    < >  --    < > 4.4   < > 4.1 3.5 3.7  CL  --    < >  --    < > 108   < > 106 104 102  CO2  --    < >  --    < > 28   < > 25 27 28   GLUCOSE  --    < >  --    < > 96   < > 100* 106* 110*  BUN  --    < >  --    < > 20   < > 23* 21* 14  CREATININE  --    < >  --    < > 0.75   < > 0.96 1.10 1.02  CALCIUM  --    < >  --    < > 9.0   < > 9.4 9.1 9.1  GFRNONAA  --    < >  --    < > >60   < > >60 >60 >60  GFRAA  --    < >  --    < > >60   < > >60 >60 >60  PROT 6.0*  --  5.9*   < > 8.4*  --   --  7.6 7.5  ALBUMIN 2.9*  --  2.7*   < > 3.8  --   --  4.1 4.0  AST 135*  --  103*   < > 26  --   --  26  26  ALT 163*  --  141*   < > 38  --   --  24 28  ALKPHOS 75  --  79   < > 85  --   --  51 50  BILITOT 2.1*  --  2.0*   < > 0.9  --   --  1.1 1.1  BILIDIR 1.0*  --  0.9*  --   --   --   --   --   --   IBILI 1.1*  --  1.1*  --   --   --   --   --   --    < > = values in this interval not displayed.   Iron/TIBC/Ferritin/ %Sat    Component Value Date/Time   IRON 163 06/20/2019 0934   TIBC 339 06/20/2019 0934   FERRITIN 81 09/17/2019 1417   IRONPCTSAT 48 (H) 06/20/2019 0934      RADIOGRAPHIC STUDIES: I have personally reviewed the radiological images as listed and agreed with the findings in the report.  No results found.    ASSESSMENT & PLAN:  1. History of pulmonary embolism   2. Chronic deep vein thrombosis (DVT) of right peroneal vein (HCC)   3. Chronic systolic congestive heart failure (HCC)   4. Elevated serum creatinine    Unprovoked acute bilateral multifocal pulmonary embolism, chronic lower extremity DVT. Labs reviewed and discussed with patient. Recommend patient to continue Xarelto 10 mg daily as maintenance. During the acute setting, hypercoagulable work-up  was done in the hospital which showed slightly low protein S activity, protein C and protein C activity.  This is most likely due to consumption in the acute setting.  I will repeat  Tachycardia/CHF, recommend patient to continue follow-up with cardiology.  Patient is on Coreg and Lasix. Elevated creatinine, patient is on Lasix, possible overdiuresis.  Advised patient to increase oral hydration. It is also possible that this can be due to the recent start of NSAIDs. I recommend patient to contact his podiatrist and see if diclofenac can be stopped and have patient take allopurinol for gout prophylaxis. I repeat BMP in 1 week for follow-up.  Orders Placed This Encounter  Procedures  . Protein C activity    Standing Status:   Future    Standing Expiration Date:   01/13/2021  . Protein S activity    Standing Status:   Future    Standing Expiration Date:   01/13/2021  . Protein C, total    Standing Status:   Future    Standing Expiration Date:   01/13/2021  . Basic metabolic panel    Standing Status:   Future    Standing Expiration Date:   01/13/2021    All questions were answered. The patient knows to call the clinic with any problems questions or concerns.  Return of visit: Follow-up with me in 6 months.  Earlie Server, MD, PhD Hematology Oncology [facility] Pager- [phone] 01/14/2020

## 2020-01-14 NOTE — Progress Notes (Signed)
Patient reports no problems today.  His heart rate is 118 today and he says that he has a history of tachycardia and follows up with cardiologist.

## 2020-01-18 ENCOUNTER — Inpatient Hospital Stay: Payer: 59

## 2020-01-18 ENCOUNTER — Other Ambulatory Visit: Payer: Self-pay

## 2020-01-18 DIAGNOSIS — Z86711 Personal history of pulmonary embolism: Secondary | ICD-10-CM | POA: Diagnosis not present

## 2020-01-18 DIAGNOSIS — I5022 Chronic systolic (congestive) heart failure: Secondary | ICD-10-CM

## 2020-01-18 DIAGNOSIS — I82551 Chronic embolism and thrombosis of right peroneal vein: Secondary | ICD-10-CM

## 2020-01-18 LAB — BASIC METABOLIC PANEL
Anion gap: 11 (ref 5–15)
BUN: 17 mg/dL (ref 6–20)
CO2: 29 mmol/L (ref 22–32)
Calcium: 8.9 mg/dL (ref 8.9–10.3)
Chloride: 100 mmol/L (ref 98–111)
Creatinine, Ser: 1.22 mg/dL (ref 0.61–1.24)
GFR calc Af Amer: >60 mL/min (ref >60)
GFR calc non Af Amer: >60 mL/min (ref >60)
Glucose, Bld: 130 mg/dL — ABNORMAL HIGH (ref 70–99)
Potassium: 4 mmol/L (ref 3.5–5.1)
Sodium: 140 mmol/L (ref 135–145)

## 2020-01-18 LAB — BILIRUBIN, DIRECT: Bilirubin, Direct: 0.2 mg/dL (ref 0.0–0.2)

## 2020-01-20 LAB — PROTEIN C ACTIVITY: Protein C Activity: 95 % (ref 73–180)

## 2020-01-20 LAB — PROTEIN C, TOTAL: Protein C, Total: 86 % (ref 60–150)

## 2020-01-20 LAB — PROTEIN S ACTIVITY: Protein S Activity: 195 % — ABNORMAL HIGH (ref 63–140)

## 2020-02-13 ENCOUNTER — Ambulatory Visit: Payer: 59 | Admitting: Cardiovascular Disease

## 2020-02-14 ENCOUNTER — Encounter: Payer: Self-pay | Admitting: Cardiovascular Disease

## 2020-07-18 ENCOUNTER — Other Ambulatory Visit: Payer: Self-pay | Admitting: Cardiovascular Disease

## 2020-07-18 ENCOUNTER — Inpatient Hospital Stay (HOSPITAL_BASED_OUTPATIENT_CLINIC_OR_DEPARTMENT_OTHER): Payer: 59 | Admitting: Oncology

## 2020-07-18 ENCOUNTER — Inpatient Hospital Stay: Payer: 59 | Attending: Oncology

## 2020-07-18 ENCOUNTER — Encounter: Payer: Self-pay | Admitting: Oncology

## 2020-07-18 VITALS — BP 132/84 | HR 94 | Temp 96.9°F | Resp 18 | Wt 198.2 lb

## 2020-07-18 DIAGNOSIS — Z86711 Personal history of pulmonary embolism: Secondary | ICD-10-CM

## 2020-07-18 DIAGNOSIS — M109 Gout, unspecified: Secondary | ICD-10-CM | POA: Insufficient documentation

## 2020-07-18 DIAGNOSIS — I5022 Chronic systolic (congestive) heart failure: Secondary | ICD-10-CM

## 2020-07-18 DIAGNOSIS — Z79899 Other long term (current) drug therapy: Secondary | ICD-10-CM | POA: Diagnosis not present

## 2020-07-18 DIAGNOSIS — I110 Hypertensive heart disease with heart failure: Secondary | ICD-10-CM | POA: Insufficient documentation

## 2020-07-18 DIAGNOSIS — Z7901 Long term (current) use of anticoagulants: Secondary | ICD-10-CM | POA: Insufficient documentation

## 2020-07-18 DIAGNOSIS — K219 Gastro-esophageal reflux disease without esophagitis: Secondary | ICD-10-CM | POA: Diagnosis not present

## 2020-07-18 DIAGNOSIS — Z791 Long term (current) use of non-steroidal anti-inflammatories (NSAID): Secondary | ICD-10-CM | POA: Diagnosis not present

## 2020-07-18 DIAGNOSIS — R7989 Other specified abnormal findings of blood chemistry: Secondary | ICD-10-CM

## 2020-07-18 DIAGNOSIS — Z86718 Personal history of other venous thrombosis and embolism: Secondary | ICD-10-CM | POA: Diagnosis not present

## 2020-07-18 DIAGNOSIS — I2510 Atherosclerotic heart disease of native coronary artery without angina pectoris: Secondary | ICD-10-CM | POA: Diagnosis not present

## 2020-07-18 DIAGNOSIS — E785 Hyperlipidemia, unspecified: Secondary | ICD-10-CM | POA: Insufficient documentation

## 2020-07-18 DIAGNOSIS — Z87891 Personal history of nicotine dependence: Secondary | ICD-10-CM | POA: Diagnosis not present

## 2020-07-18 DIAGNOSIS — I82551 Chronic embolism and thrombosis of right peroneal vein: Secondary | ICD-10-CM

## 2020-07-18 LAB — CBC WITH DIFFERENTIAL/PLATELET
Abs Immature Granulocytes: 0.02 10*3/uL (ref 0.00–0.07)
Basophils Absolute: 0 10*3/uL (ref 0.0–0.1)
Basophils Relative: 0 %
Eosinophils Absolute: 0.1 10*3/uL (ref 0.0–0.5)
Eosinophils Relative: 2 %
HCT: 37.8 % — ABNORMAL LOW (ref 39.0–52.0)
Hemoglobin: 13.5 g/dL (ref 13.0–17.0)
Immature Granulocytes: 0 %
Lymphocytes Relative: 27 %
Lymphs Abs: 1.8 10*3/uL (ref 0.7–4.0)
MCH: 35.9 pg — ABNORMAL HIGH (ref 26.0–34.0)
MCHC: 35.7 g/dL (ref 30.0–36.0)
MCV: 100.5 fL — ABNORMAL HIGH (ref 80.0–100.0)
Monocytes Absolute: 0.5 10*3/uL (ref 0.1–1.0)
Monocytes Relative: 8 %
Neutro Abs: 4.1 10*3/uL (ref 1.7–7.7)
Neutrophils Relative %: 63 %
Platelets: 150 10*3/uL (ref 150–400)
RBC: 3.76 MIL/uL — ABNORMAL LOW (ref 4.22–5.81)
RDW: 11.9 % (ref 11.5–15.5)
WBC: 6.6 10*3/uL (ref 4.0–10.5)
nRBC: 0 % (ref 0.0–0.2)

## 2020-07-18 LAB — COMPREHENSIVE METABOLIC PANEL
ALT: 23 U/L (ref 0–44)
AST: 29 U/L (ref 15–41)
Albumin: 3.8 g/dL (ref 3.5–5.0)
Alkaline Phosphatase: 39 U/L (ref 38–126)
Anion gap: 10 (ref 5–15)
BUN: 18 mg/dL (ref 6–20)
CO2: 28 mmol/L (ref 22–32)
Calcium: 9 mg/dL (ref 8.9–10.3)
Chloride: 103 mmol/L (ref 98–111)
Creatinine, Ser: 1 mg/dL (ref 0.61–1.24)
GFR, Estimated: >60 mL/min (ref >60)
Glucose, Bld: 136 mg/dL — ABNORMAL HIGH (ref 70–99)
Potassium: 3.6 mmol/L (ref 3.5–5.1)
Sodium: 141 mmol/L (ref 135–145)
Total Bilirubin: 1.1 mg/dL (ref 0.3–1.2)
Total Protein: 7.3 g/dL (ref 6.5–8.1)

## 2020-07-18 MED ORDER — RIVAROXABAN 10 MG PO TABS: 10.0000 mg | ORAL_TABLET | Freq: Every day | ORAL | 1 refills | Status: DC

## 2020-07-18 NOTE — Progress Notes (Signed)
Hematology/Oncology Consult note [facility] Telephone:([phone] [phone]   Patient Care Team: [place] as PCP - General (Family Medicine) Rockey Situ, Kathlene November, MD as PCP - Cardiology (Cardiology)  REFERRING PROVIDER: [facility]  CHIEF COMPLAINTS/REASON FOR VISIT:  Follow up  of bilateral pulmonary embolism.  HISTORY OF PRESENTING ILLNESS:   David Carlson is a  49 y.o.  male with PMH listed below was seen in consultation at the request of  [place]*  for evaluation of bilateral pulmonary embolism. Patient was admitted from 03/22/2021 03/28/2019 due to acute bilateral pulmonary embolism. Patient is initially presented to emergency room complaining shortness of breath, ongoing for the past 6 months.  Also associated with left-sided chest pain. 03/23/2019 CT angiogram chest PE protocol was independently reviewed by me. Images revealed multifocal occlusive segmental to subsegmental pulmonary embolism bilaterally, pulmonary infarction, pleural effusion and a diffuse bilateral interlobular septal thickening likely edema.  Cardiomegaly. 03/23/2019 [place] lower extremity venous Doppler showed chronic DVT involving right peroneal veins. Hypercoagulable work-up was sent off. Patient was started on heparin drip and is switched to oral Xarelto at discharge. Patient has CT evidence of heart strain.  Evaluated by cardiology and also had left heart cath with findings of mild nonobstructive coronary artery disease.  Findings are consistent with nonischemic cardiomyopathy.  Severely elevated left ventricular filling pressure.  2D echo showed acute heart failure 20 to 25% ejection fraction.  Patient takes Coreg 6.25 twice daily, Lasix 40 mg daily Entresto 24/26 mg, he had a virtual visit with Dr. Rockey Situ on 04/04/2019.  Hypercoagulable work-up negative including cardiolipin antibodies, prothrombin gene mutation, factor V  Leiden mutation, homocystine, Antithrombin, beta-2 glycoprotein, lupus anticoagulant panel, total protein S Patient has mildly decreased protein as activity, total protein C and protein C activity.  Patient was discharged on 03/28/2019 and presented back to emergency room on 03/30/2019 after having 2 bloody bowel movements.  Patient was seen by gastroenterology Dr. Bonna Gains, given his recent CHF, bilateral PE/high risk of endoscopy procedures, suggest to manage conservatively..  Patient's Xarelto 20 mg daily was held for a few days and restarted.  Patient reports that no additional bleeding events.  Elevated liver enzymes- improved from last hospitalization.RUQ ultrasound7/14showed cholelithiasis and gallbladder wall thickening. Surgery recommended outpatient follow-up.  Today he reports tolerating anticoagulation with Xarelto well.  On 20 mg daily.Denies hematochezia, hematuria, hematemesis, epistaxis, black tarry stool or easy bruising.  Reports breathing is better.still has sob with exertion. Fatigue  Denies any chest pain today.  He drinks alcohol some days.  Beer or hard liquor.  INTERVAL HISTORY David Carlson is a 49 y.o. male who has above history reviewed by me today presents for follow up visit for management of unprovoked pulmonary embolism. Problems and complaints are listed below: Patient previously is on Xarelto 10 mg daily.  He reports that he has self discontinued Xarelto for about 6 weeks due to easy bruising.  No acute bleeding events. Denies any shortness of breath, leg swelling.  .  Review of Systems  Constitutional: Negative for appetite change, chills, fatigue, fever and unexpected weight change.  HENT:   Negative for hearing loss and voice change.   Eyes: Negative for eye problems and icterus.  Respiratory: Negative for chest tightness and cough.   Cardiovascular: Negative for chest pain and leg swelling.  Gastrointestinal: Negative for abdominal distention  and abdominal pain.  Endocrine: Negative for hot flashes.  Genitourinary: Negative for difficulty urinating, dysuria  and frequency.   Musculoskeletal: Negative for arthralgias.  Skin: Negative for itching and rash.  Neurological: Negative for light-headedness and numbness.  Hematological: Negative for adenopathy. Does not bruise/bleed easily.  Psychiatric/Behavioral: Negative for confusion.    MEDICAL HISTORY:  Past Medical History:  Diagnosis Date  . Anxiety   . Bilateral pulmonary embolism ([place])    a. 03/2019 CTA Chest: multifocal occlusive segmental to subsegmental embolus present bilaterally, most notably in the bilateral lower lobes.  Concern for right middle lobe and right lower lobe pulmonary infarction-->Xarelto initiated.  . Broken shoulder   . Collar bone fracture    x 2  . Erectile dysfunction   . GERD (gastroesophageal reflux disease)   . GIB (gastrointestinal bleeding)    a. 03/2019 in setting of Xarelto 15mg  BID after PE dx-->GI rec conservative rx given NICM/PE and stable H/H.  Marland Kitchen Gout   . H/O Bell's palsy   . HFrEF (heart failure with reduced ejection fraction) ([place])    a. 03/2019 Echo: EF 20-25%, restrictive filling. Mildly reduced RV fxn. RVSP 42.5 (also acute PE). Mild to mod MR/TR. Mod LAE, Mild RAE.  Marland Kitchen Hyperlipidemia   . Hypertension   . NICM (nonischemic cardiomyopathy) ([place])    a. 03/2019 Echo: EF 20-25%; b. 03/2019 Cath: LM nl, LAD 30m, RI nl, LCX nl, RCA large, 10p.  . Obesity   . Palpitations    a. Previously evaluted when he was incarcerated in [place] - reportedly nl stress test.  . PVC's (premature ventricular contractions)    a. 04/2013 Echo: EF 50-55%, Gr1 DD, mildly dil LA;  b. 04/2017 48hr holter: freq PVCs (5.4%), rare PACs. Single 13 beat atrial run - 143 bpm.  . Right leg DVT ([place])    a. 03/2019 LE U/S: Chronic R peroneal DVT.    SURGICAL HISTORY: Past Surgical History:  Procedure Laterality Date  . CARPAL TUNNEL RELEASE Bilateral   . HERNIA REPAIR   2015  . HIP SURGERY     a. Age 52 - failure of growth plate to close - Pins inserted  . LEFT HEART CATH AND CORONARY ANGIOGRAPHY N/A 03/27/2019   Procedure: LEFT HEART CATH AND CORONARY ANGIOGRAPHY;  Surgeon: Nelva Bush, MD;  Location: [facility];  Service: Cardiovascular;  Laterality: N/A;    SOCIAL HISTORY: Social History   Socioeconomic History  . Marital status: Single    Spouse name: Not on file  . Number of children: 2  . Years of education: 30  . Highest education level: High school graduate  Occupational History  . Not on file  Tobacco Use  . Smoking status: Former Smoker    Packs/day: 1.00    Years: 20.00    Pack years: 20.00    Types: Cigarettes    Quit date: 09/15/2015    Years since quitting: 4.8  . Smokeless tobacco: Never Used  Vaping Use  . Vaping Use: Former  Substance and Sexual Activity  . Alcohol use: Yes    Alcohol/week: 0.0 standard drinks    Comment: 3-6 cans/beer on weeknights, 6-12 beers on weekends.  . Drug use: No  . Sexual activity: Yes  Other Topics Concern  . Not on file  Social History Narrative   Lives in [place] with his ex-girlfriend's brother.  Works @ [employer] in [employer].  Active @ work w/o limitations but does not routinely exercise.   Social Determinants of Health   Financial Resource Strain:   . Difficulty of Paying Living Expenses: Not on  file  Food Insecurity:   . Worried About Charity fundraiser in the Last Year: Not on file  . Ran Out of Food in the Last Year: Not on file  Transportation Needs:   . Lack of Transportation (Medical): Not on file  . Lack of Transportation (Non-Medical): Not on file  Physical Activity:   . Days of Exercise per Week: Not on file  . Minutes of Exercise per Session: Not on file  Stress:   . Feeling of Stress : Not on file  Social Connections:   . Frequency of Communication with Friends and Family: Not on file  . Frequency of Social Gatherings with Friends and  Family: Not on file  . Attends Religious Services: Not on file  . Active Member of Clubs or Organizations: Not on file  . Attends Archivist Meetings: Not on file  . Marital Status: Not on file  Intimate Partner Violence:   . Fear of Current or Ex-Partner: Not on file  . Emotionally Abused: Not on file  . Physically Abused: Not on file  . Sexually Abused: Not on file    FAMILY HISTORY: Family History  Problem Relation Age of Onset  . Diabetes Mother   . Hypertension Father   . Heart disease Maternal Grandmother   . Heart attack Maternal Grandmother   . Diabetes Maternal Grandfather   . Diabetes Paternal Grandfather     ALLERGIES:  has No Known Allergies.  MEDICATIONS:  Current Outpatient Medications  Medication Sig Dispense Refill  . albuterol (VENTOLIN HFA) 108 (90 Base) MCG/ACT inhaler Inhale 2 puffs into the lungs every 6 (six) hours as needed for wheezing or shortness of breath. 1 Inhaler 0  . carvedilol (COREG) 6.25 MG tablet Take 1 tablet (6.25 mg total) by mouth 2 (two) times daily with a meal. 180 tablet 3  . colchicine 0.6 MG tablet Take 0.6 mg by mouth daily as needed.     . fluticasone (FLONASE) 50 MCG/ACT nasal spray Place 2 sprays into both nostrils daily. 16 g 6  . furosemide (LASIX) 40 MG tablet Take 1 tablet (40 mg total) by mouth daily. 90 tablet 3  . Multiple Vitamin (MULTIVITAMIN WITH MINERALS) TABS tablet Take 1 tablet by mouth daily.    . pantoprazole (PROTONIX) 40 MG tablet Once daily 90 tablet 2  . sacubitril-valsartan (ENTRESTO) 49-51 MG Take 1 tablet by mouth 2 (two) times daily. 180 tablet 3  . atorvastatin (LIPITOR) 20 MG tablet Take 1 tablet (20 mg total) by mouth daily at 6 PM. (Patient not taking: Reported on 07/18/2020) 90 tablet 3  . diclofenac (VOLTAREN) 75 MG EC tablet Take by mouth. (Patient not taking: Reported on 07/18/2020)    . potassium chloride (KLOR-CON) 10 MEQ tablet Take 1 tablet (10 mEq total) by mouth daily. (Patient not  taking: Reported on 07/18/2020) 90 tablet 3  . rivaroxaban (XARELTO) 10 MG TABS tablet Take 1 tablet (10 mg total) by mouth daily. 90 tablet 1   No current facility-administered medications for this visit.     PHYSICAL EXAMINATION: ECOG PERFORMANCE STATUS: 1 - Symptomatic but completely ambulatory Vitals:   07/18/20 1321  BP: 132/84  Pulse: 94  Resp: 18  Temp: (!) 96.9 F (36.1 C)   Filed Weights   07/18/20 1321  Weight: 198 lb 3.2 oz (89.9 kg)    Physical Exam Constitutional:      General: He is not in acute distress. HENT:     Head: Normocephalic and  atraumatic.  Eyes:     General: No scleral icterus.    Pupils: Pupils are equal, round, and reactive to light.  Cardiovascular:     Rate and Rhythm: Normal rate and regular rhythm.     Heart sounds: Normal heart sounds.  Pulmonary:     Effort: Pulmonary effort is normal. No respiratory distress.     Breath sounds: No wheezing.  Abdominal:     General: Bowel sounds are normal. There is no distension.     Palpations: Abdomen is soft. There is no mass.     Tenderness: There is no abdominal tenderness.  Musculoskeletal:        General: No swelling or deformity. Normal range of motion.     Cervical back: Normal range of motion and neck supple.  Skin:    General: Skin is warm and dry.     Findings: No erythema or rash.     Comments: Tattoo.   Neurological:     Mental Status: He is alert and oriented to person, place, and time.     Cranial Nerves: No cranial nerve deficit.     Coordination: Coordination normal.  Psychiatric:        Behavior: Behavior normal.        Thought Content: Thought content normal.     LABORATORY DATA:  I have reviewed the data as listed Lab Results  Component Value Date   WBC 6.6 07/18/2020   HGB 13.5 07/18/2020   HCT 37.8 (L) 07/18/2020   MCV 100.5 (H) 07/18/2020   PLT 150 07/18/2020   Recent Labs    09/17/19 1417 09/17/19 1417 01/14/20 1249 01/18/20 1257 07/18/20 1231  NA 139    < > 142 140 141  K 3.7   < > 4.0 4.0 3.6  CL 102   < > 99 100 103  CO2 28   < > 30 29 28   GLUCOSE 110*   < > 118* 130* 136*  BUN 14   < > 17 17 18   CREATININE 1.02   < > 1.38* 1.22 1.00  CALCIUM 9.1   < > 9.7 8.9 9.0  GFRNONAA >60   < > >60 >60 >60  GFRAA >60  --  >60 >60  --   PROT 7.5  --  7.7  --  7.3  ALBUMIN 4.0  --  4.5  --  3.8  AST 26  --  41  --  29  ALT 28  --  43  --  23  ALKPHOS 50  --  49  --  39  BILITOT 1.1  --  1.6*  --  1.1  BILIDIR  --   --   --  0.2  --    < > = values in this interval not displayed.   Iron/TIBC/Ferritin/ %Sat    Component Value Date/Time   IRON 163 06/20/2019 0934   TIBC 339 06/20/2019 0934   FERRITIN 81 09/17/2019 1417   IRONPCTSAT 48 (H) 06/20/2019 0934      RADIOGRAPHIC STUDIES: I have personally reviewed the radiological images as listed and agreed with the findings in the report.  No results found.    ASSESSMENT & PLAN:  1. History of pulmonary embolism    Unprovoked acute bilateral multifocal pulmonary embolism, chronic lower extremity DVT. Previous work-up showed negative factor V Leiden mutation or prothrombin mutation Normal protein C&S  I recommend patient to continue long-term anticoagulation with Xarelto 10 mg daily for Discussed about the rationale  of doing low-dose Xarelto for maintenance due to the high risk of recurrent VTE. Patient agrees with the restart Xarelto He prefers to follow-up with primary care provider for chronic anticoagulation as he has too many doctor visits. I refilled his Xarelto for another 6 months and advised patient to ask primary care provider to manage his Xarelto in the future. No follow-up appointment is scheduled at this point.  Today's note will be faxed to his primary care provider at [facility] Patient appreciates our service will call primary care provider to get a follow-up appointment.  All questions were answered. The patient knows to call the clinic with any  problems questions or concerns.  Earlie Server, MD, PhD Hematology Oncology [facility] at [facility] Pager- [phone] 07/18/2020

## 2020-07-18 NOTE — Telephone Encounter (Signed)
*  STAT* If patient is at the pharmacy, call can be transferred to refill team.   1. Which medications need to be refilled? (please list name of each medication and dose if known) pantoprazole 40mg  daily  2. Which pharmacy/location (including street and city if local pharmacy) is medication to be sent to? [facility] on [address]  3. Do they need a 30 day or 90 day supply? 30  Patient is scheduled 11/8 with Mickle Plumb

## 2020-07-18 NOTE — Progress Notes (Signed)
Patient is not taking the Xarelto.  Would like to discuss with Dr. Tasia Catchings about filling his gout medication due to currently having a flare.

## 2020-07-20 NOTE — Progress Notes (Signed)
Office Visit    Patient Name: David Carlson Date of Encounter: 07/21/2020  Primary Care Provider:  [facility] Primary Cardiologist:  Ida Rogue, MD  Chief Complaint    Chief Complaint  Patient presents with  . office visit    overdue 6 month F/U-No new cardiac concerns; Meds verbally reviewed with patient.    49 yo male with hx of HFrEF / cardiomyopathy (EF 30 to 35%), h/o DVT and bilateral PE on Xarelto (reported as discontinued in October per patient today), h/o GIB with recommendation for colonoscopy not yet obtained, PVCs, palpitations, hypertension, hyperlipidemia, obesity, GERD, current alcohol use, current tobacco use (1/2 pack/day) anxiety, and erectile dysfunction, and here for overdue 90-month follow-up.  Past Medical History    Past Medical History:  Diagnosis Date  . Anxiety   . Bilateral pulmonary embolism ([place])    a. 03/2019 CTA Chest: multifocal occlusive segmental to subsegmental embolus present bilaterally, most notably in the bilateral lower lobes.  Concern for right middle lobe and right lower lobe pulmonary infarction-->Xarelto initiated.  . Broken shoulder   . Collar bone fracture    x 2  . Erectile dysfunction   . GERD (gastroesophageal reflux disease)   . GIB (gastrointestinal bleeding)    a. 03/2019 in setting of Xarelto 15mg  BID after PE dx-->GI rec conservative rx given NICM/PE and stable H/H.  Marland Kitchen Gout   . H/O Bell's palsy   . HFrEF (heart failure with reduced ejection fraction) ([place])    a. 03/2019 Echo: EF 20-25%, restrictive filling. Mildly reduced RV fxn. RVSP 42.5 (also acute PE). Mild to mod MR/TR. Mod LAE, Mild RAE.  Marland Kitchen Hyperlipidemia   . Hypertension   . NICM (nonischemic cardiomyopathy) ([place])    a. 03/2019 Echo: EF 20-25%; b. 03/2019 Cath: LM nl, LAD 3m, RI nl, LCX nl, RCA large, 10p.  . Obesity   . Palpitations    a. Previously evaluted when he was incarcerated in [place] - reportedly nl stress test.  . PVC's  (premature ventricular contractions)    a. 04/2013 Echo: EF 50-55%, Gr1 DD, mildly dil LA;  b. 04/2017 48hr holter: freq PVCs (5.4%), rare PACs. Single 13 beat atrial run - 143 bpm.  . Right leg DVT ([place])    a. 03/2019 LE U/S: Chronic R peroneal DVT.   Past Surgical History:  Procedure Laterality Date  . CARDIAC CATHETERIZATION    . CARPAL TUNNEL RELEASE Bilateral   . HERNIA REPAIR  2015  . HIP SURGERY     a. Age 70 - failure of growth plate to close - Pins inserted  . LEFT HEART CATH AND CORONARY ANGIOGRAPHY N/A 03/27/2019   Procedure: LEFT HEART CATH AND CORONARY ANGIOGRAPHY;  Surgeon: Nelva Bush, MD;  Location: [facility];  Service: Cardiovascular;  Laterality: N/A;    Allergies  No Known Allergies  History of Present Illness    David Carlson is a 49 y.o. male with PMH as above.   He has a history of palpitations that dates back to age 89.  He underwent stress testing while incarcerated in [place] in 2008 and this was normal.  He was told that he was having PVCs.  After losing some weight, he had resolutions of palpitations.  In 2018, he noted recurrent palpitations.  Echo showed nl LVSF with diastolic dysfunction.  48-hour Holter monitoring showed frequent PVCs accounting for 5.4% of total beats.  He was treated with beta-blocker therapy at that  time with improvement in his symptoms.  Since that time, he has quit smoking and drinking and his palpitations have remained under control with beta-blocker.    He was seen by [facility] cardiology for follow-up of a 6-week history of dyspnea and left lower extremity swelling on 03/24/2019 and in the setting of being more sedentary. Concern was for DVT / PE, though no palpable cord was felt on exam. While in the clinic, LE [place] was obtained to r/o DVT with chronic clot / DVT on the right lower extremity. Stat CTA of the chest showed PE multifocal occlusive segmental to subsegmental bilateral lower lobe emboli with concern for  developing pulmonary infarction. He was sent to the ED and admitted and placed on anticoagulation. Echo showed EF 20-25% with reduced RVSF, mild to moderate MR/TR. He underwent LHC for reduced LVSF that showed mild nonobstructive dz / NICM and was discharged. Later in July, he returned to the ED with GIB in the setting of Xarelto 15mg  BID. Xarelto was held then resumed at lower maintenance dose 20g daily.  Echo 06/2019 showed EF 30-35%, DD, RVSF moderately reduced.   He was last seen by his primary cardiologist 08/13/2019. 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  He was followed by hematology / oncology and tolerating Xarelto (noted to need at least 6 months OSA). He felt his dry wt was 218lbs. Labs showed low K and he was started on supplementation.  Today, 07/21/2020, he returns to clinic and notes that he is doing well from a cardiac standpoint.  He notes chest pain that occurs within 30 to 45 minutes of eating and occurs both with exertion and at rest.  He reports that, if the chest pain occurs with exertion, it does not go away after resting, and it usually lasts the majority of the day before dissipating.  This occurs a few times per month.  It is described as a burning central CP, which he has attributed to his reflux with request for refill on his Protonix.  No recent shortness of breath or dyspnea, though he denies a regular workout routine.  He stays stays very active with his job in maintenance, walking up to 18,000 steps per day.  No CP or shortness of breath with his steps.  He has restarted both tobacco use and alcohol use.  He reports current tobacco use, estimated at 1/2 pack/day.  He also reports current alcohol use on the weekends, with 3+ beers on Saturday.  He is aware that he should quit smoking and reduce EtOH and reports he is motivated to do so.  No tachypalpitations, despite self discontinued potassium supplementation.  No recent falls, presyncope,  or syncope.  He reports that he self discontinued his Xarelto 1 month ago, due to easy bruising.  He denies any signs or symptoms of bleeding with [place].  He has not yet had his colonoscopy or reached out to GI but does intend to do so soon, given his recommendation at discharge following his admission.  He denies any recent lower extremity edema or asymmetric edema and reports compliance with his Lasix. No orthopnea, PND, weight gain, or early satiety.  We reviewed his echo today with ongoing low EF, as well as his current medications, which have not yet been escalated to optimal GDMT.  He self discontinued statin as below with restart recommended.  Most recent labs reviewed today.  BP130/90 with recommendation for escalation of GDMT as below  and home BP checks with guideline of BP 130/90 or lower.  Low-salt diet and fluid restriction reviewed in detail.  Further recommendations as below.  Home Medications    Current Outpatient Medications on File Prior to Visit  Medication Sig Dispense Refill  . albuterol (VENTOLIN HFA) 108 (90 Base) MCG/ACT inhaler Inhale 2 puffs into the lungs every 6 (six) hours as needed for wheezing or shortness of breath. 1 Inhaler 0  . carvedilol (COREG) 6.25 MG tablet Take 1 tablet (6.25 mg total) by mouth 2 (two) times daily with a meal. 180 tablet 3  . colchicine 0.6 MG tablet Take 0.6 mg by mouth daily as needed.     . diclofenac (VOLTAREN) 75 MG EC tablet Take 75 mg by mouth daily as needed.    . fluticasone (FLONASE) 50 MCG/ACT nasal spray Place 2 sprays into both nostrils daily. 16 g 6  . furosemide (LASIX) 40 MG tablet Take 1 tablet (40 mg total) by mouth daily. 90 tablet 3  . Multiple Vitamin (MULTIVITAMIN WITH MINERALS) TABS tablet Take 1 tablet by mouth daily.    . sacubitril-valsartan (ENTRESTO) 49-51 MG Take 1 tablet by mouth 2 (two) times daily. 180 tablet 3   No current facility-administered medications on file prior to visit.    Review of Systems    He  reports chest pain that occurs after eating and with exertion and at rest.  CP usually persists throughout a full day and is not alleviated by rest, described as centrally located and attributed per patient report to GERD.  No signs or symptoms of bleeding.  Of note, he has not yet had his colonoscopy.  He has discontinued his [place] but denies any increasing shortness of breath or lower extremity edema/asymmetric edema.  No tachypalpitations.  Now PND, orthopnea, abdominal distention, early satiety, or weight gain.  No nausea, emesis, presyncope, or syncope.   All other systems reviewed and are otherwise negative except as noted above.  Physical Exam    VS:  BP 130/90 (BP Location: Left Arm, Patient Position: Sitting, Cuff Size: Normal)   Pulse (!) 104   Ht 5\' 10"  (1.778 m)   Wt 197 lb (89.4 kg)   SpO2 98%   BMI 28.27 kg/m  , BMI Body mass index is 28.27 kg/m. GEN: Well nourished, well developed, in no acute distress. HEENT: normal. Neck: Supple, no JVD, carotid bruits, or masses. Cardiac: Mildly tachycardic but regular.  No murmurs, rubs, or gallops. No clubbing, cyanosis.no lower extremity edema.  Radials/DP/PT 2+ and equal bilaterally.  Respiratory:  Respirations regular and unlabored, clear to auscultation bilaterally. GI: Soft, nontender, nondistended, BS + x 4. MS: no deformity or atrophy. Skin: warm and dry, no rash. Neuro:  Strength and sensation are intact. Psych: Normal affect.  Accessory Clinical Findings    ECG personally reviewed by me today - sinus tachycardia, 104 bpm, QTC prolonged at 518 ms, T wave inversion V3 to V6/1, aVL and as seen on 08/13/2019 EKG, PVCs have resolved- no acute changes.  VITALS Reviewed today   Temp Readings from Last 3 Encounters:  07/18/20 (!) 96.9 F (36.1 C)  01/14/20 (!) 95.7 F (35.4 C)  06/20/19 (!) 95.3 F (35.2 C)   BP Readings from Last 3 Encounters:  07/21/20 130/90  07/18/20 132/84  01/14/20 121/89   Pulse Readings from Last 3  Encounters:  07/21/20 (!) 104  07/18/20 94  01/14/20 (!) 118    Wt Readings from Last 3 Encounters:  07/21/20 197  lb (89.4 kg)  07/18/20 198 lb 3.2 oz (89.9 kg)  01/14/20 208 lb 4.8 oz (94.5 kg)     LABS  reviewed today   Lab Results  Component Value Date   WBC 6.6 07/18/2020   HGB 13.5 07/18/2020   HCT 37.8 (L) 07/18/2020   MCV 100.5 (H) 07/18/2020   PLT 150 07/18/2020   Lab Results  Component Value Date   CREATININE 1.00 07/18/2020   BUN 18 07/18/2020   NA 141 07/18/2020   K 3.6 07/18/2020   CL 103 07/18/2020   CO2 28 07/18/2020   Lab Results  Component Value Date   ALT 23 07/18/2020   AST 29 07/18/2020   ALKPHOS 39 07/18/2020   BILITOT 1.1 07/18/2020   Lab Results  Component Value Date   CHOL 69 03/26/2019   HDL 12 (L) 03/26/2019   LDLCALC 46 03/26/2019   TRIG 53 03/26/2019   CHOLHDL 5.8 03/26/2019    Lab Results  Component Value Date   HGBA1C 5.5 03/24/2019   Lab Results  Component Value Date   TSH 0.82 02/26/2019     STUDIES/PROCEDURES reviewed today   Echo  07/10/2019 1. Left ventricular ejection fraction, by visual estimation, is 30 to  35%. The left ventricle has moderate to severely decreased function.  Normal left ventricular size. There is borderline left ventricular  hypertrophy.  2. Left ventricular diastolic Doppler parameters are consistent with  impaired relaxation pattern of LV diastolic filling.  3. Global right ventricle has moderately reduced systolic function.The  right ventricular size is normal. No increase in right ventricular wall  thickness.  4. Left atrial size was normal.  5. Right atrial size was normal.  6. The mitral valve is normal in structure. Mild mitral valve  regurgitation.  7. The tricuspid valve is not well visualized. Tricuspid valve  regurgitation is trivial.  8. The aortic valve is tricuspid Aortic valve regurgitation was not  visualized by color flow Doppler.  9. The pulmonic valve was normal  in structure. Pulmonic valve  regurgitation is trivial by color flow Doppler.  10. TR signal is inadequate for assessing pulmonary artery systolic  pressure.  11. The inferior vena cava is normal in size with <50% respiratory  variability, suggesting right atrial pressure of 8 mmHg.  12. The interatrial septum was not well visualized.   LHC 03/27/2019 Conclusions: 1. Mild, nonobstructive coronary artery disease. Findings areconsistent with nonischemic cardiomyopathy. 2. Severely elevated left ventricular filling pressure. Recommendations: 1. IV diuresis and optimization of evidence-based heart failure therapy. 2. Start heparin infusion 2 hours after TR band removal; anticipate transitioning to oral anticoagulation for treatment of bilateral pulmonary emboli tomorrow or whenever no further invasive procedures are planned.  Cardiac Monitor, 48h 04/2017  The patient was monitored for 32 hours.  The predominant rhythm was sinus with an average rate of 107 bpm (range 55-159 bpm).  There were rare PACs. A single atrial run lasting 13 beats with a maximum rate of 143 bpm was observed  Frequent PVCs (5.4% PVC burden) were seen, including rare ventricular couplets and triplets.  There were no patient triggered events. Predominantly sinus rhythm with high average heart rate (>100 bpm). Frequent PVCs, rare PACs, and single atrial run were noted.  Assessment & Plan    NICM, HFrEF (EF 30-35%) --No chest pain.  No shortness of breath or dyspnea.  Euvolemic and well compensated on exam.  06/2019 repeat echo demonstrated improved EF but still low at 30 to 35%.  Recommend optimal GDMT and lifestyle changes as detailed below before referral to EP for consideration of ICD therapy.   --Medications:   Start spironolactone 25 mg daily, given his room in BP today.    Continue Entresto 49-51.  At RTC, consider escalation of Entresto if Cr/BP allows.    Continue carvedilol.  At RTC, consider increase  Coreg if ongoing ST and BP allows.    Continue current Lasix 40 mg daily.  Restart KCl tab 10 M EQ daily, to be taken with Lasix 40 mg daily.  --Labs:   BMET today and in 1 week.  --Between visits:  Continue to monitor home BP and weight.   Lifestyle changes including diet, increased activity, smoking cessation.    Ensure all fluid intake under 2 L daily and salt intake under 2 g daily.    Call the office if any new or worsening symptoms --Repeat echo in 3 months or once optimal GDMT and lifestyle changes completed as outlined above.    If EF remains low on subsequent repeat echo, will refer to EP for formal consideration of ICD.  H/o Hypokalemia --History of low potassium.  Per patient, he discontinued potassium supplementation, as he was not aware that he should continue taking this with his Lasix.  Reviewed need for ongoing KCl tab if ongoing Lasix with patient understanding.   Restart KCl tab 10 M EQ daily to be taken with daily Lasix.  BMET today.    Start spironolactone 25 mg daily as above.  Repeat BMET in 1 week.   Further recommendations, if indicated, pending results of labs  H/o Bilateral Pulmonary Embolism, DVT  Previous [place] with Xarelto --Denies any signs or symptoms similar to that experienced before his DVT/PE as above.  He has self discontinued his Xarelto.  Will reach out to primary cardiologist/treatment team to ensure aware of discontinued [place] with further recommendations, if indicated, at that time.  In the meantime, start ASA 81 mg daily with ongoing Protonix as below.  Discussed recommendation to reach out to GI for colonoscopy (given history of GI bleed) as outlined above in HPI.  Repeat CBC.  Further recommendations following discussion with treatment team regarding [place].  History of GI bleed --As above.  No signs or symptoms of GI bleed reported today. Recheck a CBC.  We will refill his Protonix.  Recommended outpatient follow-up with GI and colonoscopy  as recommended during previous admission.    HTN, goal BP 130/80 or lower  --BP 130/90 today.  As above, escalation of GDMT recommended. Start of spironolactone 25 mg daily.  At RTC, if continued room in BP, recommend escalation of Entresto / Coreg dose as tolerated for optimal BP and heart rate control.  Reviewed sodium restriction under 2 g daily and all fluids under 2 L daily.  Ongoing home BP checks with brachial cuff/weight checks recommended.   PVCs/ Palpitations, resolved --No further palpitations reported.  History of PVCs with today's EKG without PVCs.  Given he has discontinued his potassium supplement, we will recheck of BMET and restart his potassium supplementation with further recommendations, if indicated, following results of labs.    HLD  --Restart statin, discontinued between visits.  Previous 03/2019 LDL 46.  Recommend repeat LDL/LFTs now and in 6 to 8 weeks with escalation of statin at that time if indicated.  CKD --Recheck BMET today and in 1 week. Baseline Cr appears ~0.90-0.95.  Further recommendations, if indicated, pending repeat labs.   Current tobacco use --Smoking at 1/2  pack per day.  Complete smoking cessation advised.  He expressed a desire to quit and will work towards cutting back.  He is motivated to quit today.  Current alcohol use --Reports alcohol use on the weekends - 3 or more beers on Saturday.  Given his ongoing cardiomyopathy with EF 30 to 35%, despite medical management, recommended was he work towards complete alcohol cessation.  Reviewed that ongoing alcohol intake may be contributing to his persistent low EF.  He expressed understanding and will work towards cutting back /complete cessation.  He is motivated to quit.  QTC prolongation --Close monitoring with repeat EKG recommended at RTC.  Avoid QT prolonging medications.  Will recheck of BMET today to ensure electrolytes at goal. ---------------  Medication changes: Start spironolactone 25 mg  daily.  Restart KCl tab 10 M EQ daily, restart statin.  Refill Protonix.  Start ASA 81 mg daily.  Will discuss [place] with primary cardiologist/treatment team and further recommendations, if indicated, at that time.  Labs ordered: CMET, lipids/direct LDL (now and in 6 to 8 weeks), CBC, repeat BMET in 1 week. Studies / Imaging ordered: Repeat echo following escalation of GDMT and completed lifestyle changes.  Future considerations: At RTC, increase Coreg if room in HR/BP; increase Entresto if Cr/BP allows.  Ensure GI follow-up/colonoscopy. Reassess if completed lifestyle changes as outlined above.  If repeat EF remains low on echo, despite optimized GDMT and lifestyle changes outlined above, refer to EP for formal ICD consideration.   Disposition: RTC 1 month to reassess GDMT, estimated repeat echo in 3 months (or s/p optimal escalation of GDMT and completed lifestyle changes as above).     Arvil Chaco, PA-C 07/21/2020

## 2020-07-21 ENCOUNTER — Ambulatory Visit (INDEPENDENT_AMBULATORY_CARE_PROVIDER_SITE_OTHER): Payer: 59 | Admitting: Physician Assistant

## 2020-07-21 ENCOUNTER — Encounter: Payer: Self-pay | Admitting: Physician Assistant

## 2020-07-21 ENCOUNTER — Other Ambulatory Visit: Payer: Self-pay

## 2020-07-21 VITALS — BP 130/90 | HR 104 | Ht 70.0 in | Wt 197.0 lb

## 2020-07-21 DIAGNOSIS — Z86711 Personal history of pulmonary embolism: Secondary | ICD-10-CM

## 2020-07-21 DIAGNOSIS — I5022 Chronic systolic (congestive) heart failure: Secondary | ICD-10-CM

## 2020-07-21 DIAGNOSIS — E785 Hyperlipidemia, unspecified: Secondary | ICD-10-CM

## 2020-07-21 DIAGNOSIS — I428 Other cardiomyopathies: Secondary | ICD-10-CM | POA: Diagnosis not present

## 2020-07-21 DIAGNOSIS — Z8719 Personal history of other diseases of the digestive system: Secondary | ICD-10-CM

## 2020-07-21 DIAGNOSIS — Z720 Tobacco use: Secondary | ICD-10-CM

## 2020-07-21 DIAGNOSIS — I493 Ventricular premature depolarization: Secondary | ICD-10-CM

## 2020-07-21 DIAGNOSIS — I10 Essential (primary) hypertension: Secondary | ICD-10-CM

## 2020-07-21 DIAGNOSIS — Z7289 Other problems related to lifestyle: Secondary | ICD-10-CM

## 2020-07-21 DIAGNOSIS — R079 Chest pain, unspecified: Secondary | ICD-10-CM

## 2020-07-21 DIAGNOSIS — Z79899 Other long term (current) drug therapy: Secondary | ICD-10-CM | POA: Diagnosis not present

## 2020-07-21 DIAGNOSIS — I420 Dilated cardiomyopathy: Secondary | ICD-10-CM

## 2020-07-21 DIAGNOSIS — K219 Gastro-esophageal reflux disease without esophagitis: Secondary | ICD-10-CM

## 2020-07-21 DIAGNOSIS — Z789 Other specified health status: Secondary | ICD-10-CM

## 2020-07-21 DIAGNOSIS — Z86718 Personal history of other venous thrombosis and embolism: Secondary | ICD-10-CM

## 2020-07-21 MED ORDER — SPIRONOLACTONE 25 MG PO TABS
25.0000 mg | ORAL_TABLET | Freq: Every day | ORAL | 6 refills | Status: DC
Start: 2020-07-21 — End: 2021-04-06

## 2020-07-21 MED ORDER — POTASSIUM CHLORIDE ER 10 MEQ PO TBCR: 10.0000 meq | EXTENDED_RELEASE_TABLET | Freq: Every day | ORAL | 6 refills | Status: DC

## 2020-07-21 MED ORDER — ASPIRIN EC 81 MG PO TBEC: 81.0000 mg | DELAYED_RELEASE_TABLET | Freq: Every day | ORAL | Status: AC

## 2020-07-21 MED ORDER — PANTOPRAZOLE SODIUM 40 MG PO TBEC: DELAYED_RELEASE_TABLET | ORAL | 2 refills | Status: DC

## 2020-07-21 MED ORDER — ATORVASTATIN CALCIUM 20 MG PO TABS: 20.0000 mg | ORAL_TABLET | Freq: Every day | ORAL | 6 refills | Status: DC

## 2020-07-21 NOTE — Telephone Encounter (Signed)
Pantoprozole was refilled at the patient's office visit today.

## 2020-07-21 NOTE — Telephone Encounter (Signed)
Noted! Thank you

## 2020-07-21 NOTE — Patient Instructions (Signed)
Medication Instructions:  - Your physician has recommended you make the following change in your medication:   1) STOP xarelto  2) START aspirin 81 mg- take 1 tablet by mouth once daily  3) START spironolactone 25 mg- take 1 tablet by mouth once daily   4) RESTART atorvastatin 20 mg- take 1 tablet by mouth once daily  5) RESTART potassium 10 meq- take 1 tablet by mouth once daily   Samples Given: Aspirin 81 mg Lot: [id number] Exp: 09/2021 # 2 bottles given  *If you need a refill on your cardiac medications before your next appointment, please call your pharmacy*   Lab Work: - Your physician recommends that you have lab work today: CMET/ CBC/ Lipid  - Your physician recommends that you return for lab work in: 1 week- BMP - come to the [place] entrance at [facility] - 1st desk on the right to check in, just past the screening table - Lab hours: Monday- Friday (7:30 am- 5:30 pm)    If you have labs (blood work) drawn today and your tests are completely normal, you will receive your results only by: Marland Kitchen MyChart Message (if you have MyChart) OR . A paper copy in the mail If you have any lab test that is abnormal or we need to change your treatment, we will call you to review the results.   Testing/Procedures: - Your physician has requested that you have an echocardiogram in 3 months. Echocardiography is a painless test that uses sound waves to create images of your heart. It provides your doctor with information about the size and shape of your heart and how well your heart's chambers and valves are working. This procedure takes approximately one hour. There are no restrictions for this procedure. There is a possibility that an IV may need to be started during your test to inject an image enhancing agent. This is done to obtain more optimal pictures of your heart. Therefore we ask that you do at least drink some water prior to coming in to hydrate your veins.     Follow-Up: At [facility], you and your health needs are our priority.  As part of our continuing mission to provide you with exceptional heart care, we have created designated Provider Care Teams.  These Care Teams include your primary Cardiologist (physician) and Advanced Practice Providers (APPs -  Physician Assistants and Nurse Practitioners) who all work together to provide you with the care you need, when you need it.  We recommend signing up for the patient portal called "MyChart".  Sign up information is provided on this After Visit Summary.  MyChart is used to connect with patients for Virtual Visits (Telemedicine).  Patients are able to view lab/test results, encounter notes, upcoming appointments, etc.  Non-urgent messages can be sent to your provider as well.   To learn more about what you can do with MyChart, go to [email or web address].    Your next appointment:   3 month(s)- just after the echo is completed  The format for your next appointment:   In Person  Provider:   You may see Ida Rogue, MD or one of the following Advanced Practice Providers on your designated Care Team:    Murray Hodgkins, NP  Christell Faith, PA-C  Marrianne Mood, PA-C  Cadence Kathlen Mody, [place]    Other Instructions  Spironolactone Oral Tablets What is this medicine? SPIRONOLACTONE (speer on oh LAK tone) is a diuretic. It helps you make more urine and to lose  excess water from your body. This medicine is used to treat high blood pressure, and edema or swelling from heart, kidney, or liver disease. It is also used to treat patients who make too much aldosterone or have low potassium. This medicine may be used for other purposes; ask your health care provider or pharmacist if you have questions. COMMON BRAND NAME(S): Aldactone What should I tell my health care provider before I take this medicine? They need to know if you have any of these conditions:  high blood level of potassium  kidney disease or trouble  making urine  liver disease  an unusual or allergic reaction to spironolactone, other medicines, foods, dyes, or preservatives  pregnant or trying to get pregnant  breast-feeding How should I use this medicine? Take this drug by mouth. Take it as directed on the prescription label at the same time every day. You can take it with or without food. You should always take it the same way. Keep taking it unless your health care provider tells you to stop. Talk to your health care provider about the use of this drug in children. Special care may be needed. Overdosage: If you think you have taken too much of this medicine contact a poison control center or emergency room at once. NOTE: This medicine is only for you. Do not share this medicine with others. What if I miss a dose? If you miss a dose, take it as soon as you can. If it is almost time for your next dose, take only that dose. Do not take double or extra doses. What may interact with this medicine? Do not take this medicine with any of the following medications:  cidofovir  eplerenone  tranylcypromine This medicine may also interact with the following medications:  aspirin  certain medicines for blood pressure or heart disease like benazepril, lisinopril, losartan, valsartan  certain medicines that treat or prevent blood clots like heparin and enoxaparin  cholestyramine  cyclosporine  digoxin  lithium  medicines that relax muscles for surgery  NSAIDs, medicines for pain and inflammation, like ibuprofen or naproxen  other diuretics  potassium supplements  steroid medicines like prednisone or cortisone  trimethoprim This list may not describe all possible interactions. Give your health care provider a list of all the medicines, herbs, non-prescription drugs, or dietary supplements you use. Also tell them if you smoke, drink alcohol, or use illegal drugs. Some items may interact with your medicine. What should I  watch for while using this medicine? Visit your doctor or health care professional for regular checks on your progress. Check your blood pressure as directed. Ask your doctor what your blood pressure should be, and when you should contact them. You may need to be on a special diet while taking this medicine. Ask your doctor. Also, ask how many glasses of fluid you need to drink a day. You must not get dehydrated. This medicine may make you feel confused, dizzy or lightheaded. Drinking alcohol and taking some medicines can make this worse. Do not drive, use machinery, or do anything that needs mental alertness until you know how this medicine affects you. Do not sit or stand up quickly. What side effects may I notice from receiving this medicine? Side effects that you should report to your doctor or health care professional as soon as possible:  allergic reactions such as skin rash or itching, hives, swelling of the lips, mouth, tongue, or throat  black or tarry stools  fast, irregular heartbeat  fever  muscle pain, cramps  numbness, tingling in hands or feet  trouble breathing  trouble passing urine  unusual bleeding  unusually weak or tired Side effects that usually do not require medical attention (report to your doctor or health care professional if they continue or are bothersome):  change in voice or hair growth  confusion  dizzy, drowsy  dry mouth, increased thirst  enlarged or tender breasts  headache  irregular menstrual periods  sexual difficulty, unable to have an erection  stomach upset This list may not describe all possible side effects. Call your doctor for medical advice about side effects. You may report side effects to FDA at 1-800-FDA-1088. Where should I keep my medicine? Keep out of the reach of children and pets. Store at room temperature between 20 and 25 degrees C (68 and 77 degrees F). Throw away any unused drug after the expiration date. NOTE:  This sheet is a summary. It may not cover all possible information. If you have questions about this medicine, talk to your doctor, pharmacist, or health care provider.  2020 Elsevier/Gold Standard (2019-04-24 12:38:34)    Echocardiogram An echocardiogram is a procedure that uses painless sound waves (ultrasound) to produce an image of the heart. Images from an echocardiogram can provide important information about:  Signs of coronary artery disease (CAD).  Aneurysm detection. An aneurysm is a weak or damaged part of an artery wall that bulges out from the normal force of blood pumping through the body.  Heart size and shape. Changes in the size or shape of the heart can be associated with certain conditions, including heart failure, aneurysm, and CAD.  Heart muscle function.  Heart valve function.  Signs of a past heart attack.  Fluid buildup around the heart.  Thickening of the heart muscle.  A tumor or infectious growth around the heart valves. Tell a health care provider about:  Any allergies you have.  All medicines you are taking, including vitamins, herbs, eye drops, creams, and over-the-counter medicines.  Any blood disorders you have.  Any surgeries you have had.  Any medical conditions you have.  Whether you are pregnant or may be pregnant. What are the risks? Generally, this is a safe procedure. However, problems may occur, including:  Allergic reaction to dye (contrast) that may be used during the procedure. What happens before the procedure? No specific preparation is needed. You may eat and drink normally. What happens during the procedure?   An IV tube may be inserted into one of your veins.  You may receive contrast through this tube. A contrast is an injection that improves the quality of the pictures from your heart.  A gel will be applied to your chest.  A wand-like tool (transducer) will be moved over your chest. The gel will help to transmit  the sound waves from the transducer.  The sound waves will harmlessly bounce off of your heart to allow the heart images to be captured in real-time motion. The images will be recorded on a computer. The procedure may vary among health care providers and hospitals. What happens after the procedure?  You may return to your normal, everyday life, including diet, activities, and medicines, unless your health care provider tells you not to do that. Summary  An echocardiogram is a procedure that uses painless sound waves (ultrasound) to produce an image of the heart.  Images from an echocardiogram can provide important information about the size and shape of your heart, heart muscle  function, heart valve function, and fluid buildup around your heart.  You do not need to do anything to prepare before this procedure. You may eat and drink normally.  After the echocardiogram is completed, you may return to your normal, everyday life, unless your health care provider tells you not to do that. This information is not intended to replace advice given to you by your health care provider. Make sure you discuss any questions you have with your health care provider. Document Revised: 12/21/2018 Document Reviewed: 10/02/2016 Elsevier Patient Education  [place].

## 2020-07-22 LAB — CBC WITH DIFFERENTIAL/PLATELET
Basophils Absolute: 0.1 10*3/uL (ref 0.0–0.2)
Basos: 1 %
EOS (ABSOLUTE): 0.1 10*3/uL (ref 0.0–0.4)
Eos: 2 %
Hematocrit: 40.9 % (ref 37.5–51.0)
Hemoglobin: 14.5 g/dL (ref 13.0–17.7)
Immature Grans (Abs): 0 10*3/uL (ref 0.0–0.1)
Immature Granulocytes: 0 %
Lymphocytes Absolute: 1.7 10*3/uL (ref 0.7–3.1)
Lymphs: 34 %
MCH: 35.2 pg — ABNORMAL HIGH (ref 26.6–33.0)
MCHC: 35.5 g/dL (ref 31.5–35.7)
MCV: 99 fL — ABNORMAL HIGH (ref 79–97)
Monocytes Absolute: 0.6 10*3/uL (ref 0.1–0.9)
Monocytes: 12 %
Neutrophils Absolute: 2.6 10*3/uL (ref 1.4–7.0)
Neutrophils: 51 %
Platelets: 251 10*3/uL (ref 150–450)
RBC: 4.12 x10E6/uL — ABNORMAL LOW (ref 4.14–5.80)
RDW: 12 % (ref 11.6–15.4)
WBC: 5.1 10*3/uL (ref 3.4–10.8)

## 2020-07-22 LAB — COMPREHENSIVE METABOLIC PANEL
ALT: 26 IU/L (ref 0–44)
AST: 27 IU/L (ref 0–40)
Albumin/Globulin Ratio: 1.7 (ref 1.2–2.2)
Albumin: 4.5 g/dL (ref 4.0–5.0)
Alkaline Phosphatase: 56 IU/L (ref 44–121)
BUN/Creatinine Ratio: 16 (ref 9–20)
BUN: 12 mg/dL (ref 6–24)
Bilirubin Total: 0.3 mg/dL (ref 0.0–1.2)
CO2: 24 mmol/L (ref 20–29)
Calcium: 8.9 mg/dL (ref 8.7–10.2)
Chloride: 104 mmol/L (ref 96–106)
Creatinine, Ser: 0.77 mg/dL (ref 0.76–1.27)
GFR calc Af Amer: 124 mL/min/{1.73_m2} (ref >59)
GFR calc non Af Amer: 107 mL/min/{1.73_m2} (ref >59)
Globulin, Total: 2.7 g/dL (ref 1.5–4.5)
Glucose: 107 mg/dL — ABNORMAL HIGH (ref 65–99)
Potassium: 3.9 mmol/L (ref 3.5–5.2)
Sodium: 146 mmol/L — ABNORMAL HIGH (ref 134–144)
Total Protein: 7.2 g/dL (ref 6.0–8.5)

## 2020-07-22 LAB — LIPID PANEL
Chol/HDL Ratio: 5.5 ratio — ABNORMAL HIGH (ref 0.0–5.0)
Cholesterol, Total: 205 mg/dL — ABNORMAL HIGH (ref 100–199)
HDL: 37 mg/dL — ABNORMAL LOW (ref >39)
LDL Chol Calc (NIH): 90 mg/dL (ref 0–99)
Triglycerides: 474 mg/dL — ABNORMAL HIGH (ref 0–149)
VLDL Cholesterol Cal: 78 mg/dL — ABNORMAL HIGH (ref 5–40)

## 2020-07-22 NOTE — Addendum Note (Signed)
Addended by: Janan Ridge on: 07/22/2020 12:50 PM   Modules accepted: Orders

## 2020-07-25 ENCOUNTER — Telehealth: Payer: Self-pay

## 2020-07-25 DIAGNOSIS — E782 Mixed hyperlipidemia: Secondary | ICD-10-CM

## 2020-07-25 NOTE — Telephone Encounter (Addendum)
Patient made aware of lab results and Marrianne Mood, PA recommendation.  He will have his repeat bmet in 1wk.(order previously placed) Orders placed for fasting lipid and [facility].  Patient was not previously taking his Atorvastatin. Atorvastatin 20 mg qd was recently resumed on 07/21/20. Adv the patient to take the prescribed 20mg  with plans to have labs in 6-8wks. I will fwd the update to [place].

## 2020-07-25 NOTE — Telephone Encounter (Signed)
-----   Message from Arvil Chaco, PA-C sent at 07/24/2020  4:02 PM EST ----- Renal function stable and electrolytes at goal.  --Repeat BMET in 1 week (to recheck since starting spironolactone).   Blood counts stable from previous.  Cholesterol not at goal. Liver function normal. --Increase to atorvastatin 40mg  daily. --Repeat direct LDL/lipids, and LFTs in 6-8 weeks.

## 2020-07-25 NOTE — Telephone Encounter (Signed)
Noted. Thank you. Agree with recommendations.  

## 2020-08-06 ENCOUNTER — Other Ambulatory Visit
Admission: RE | Admit: 2020-08-06 | Discharge: 2020-08-06 | Disposition: A | Discharge status: Home / Self Care | Payer: 59 | Attending: Physician Assistant | Admitting: Physician Assistant

## 2020-08-06 DIAGNOSIS — Z79899 Other long term (current) drug therapy: Secondary | ICD-10-CM

## 2020-08-06 DIAGNOSIS — E782 Mixed hyperlipidemia: Secondary | ICD-10-CM | POA: Diagnosis present

## 2020-08-06 DIAGNOSIS — I5022 Chronic systolic (congestive) heart failure: Secondary | ICD-10-CM

## 2020-08-06 DIAGNOSIS — I428 Other cardiomyopathies: Secondary | ICD-10-CM

## 2020-08-06 LAB — COMPREHENSIVE METABOLIC PANEL
ALT: 29 U/L (ref 0–44)
AST: 28 U/L (ref 15–41)
Albumin: 4.1 g/dL (ref 3.5–5.0)
Alkaline Phosphatase: 52 U/L (ref 38–126)
Anion gap: 11 (ref 5–15)
BUN: 24 mg/dL — ABNORMAL HIGH (ref 6–20)
CO2: 24 mmol/L (ref 22–32)
Calcium: 9.3 mg/dL (ref 8.9–10.3)
Chloride: 105 mmol/L (ref 98–111)
Creatinine, Ser: 1.16 mg/dL (ref 0.61–1.24)
GFR, Estimated: >60 mL/min (ref >60)
Glucose, Bld: 105 mg/dL — ABNORMAL HIGH (ref 70–99)
Potassium: 4.3 mmol/L (ref 3.5–5.1)
Sodium: 140 mmol/L (ref 135–145)
Total Bilirubin: 0.8 mg/dL (ref 0.3–1.2)
Total Protein: 7.6 g/dL (ref 6.5–8.1)

## 2020-08-06 LAB — LIPID PANEL
Cholesterol: 152 mg/dL (ref 0–200)
HDL: 43 mg/dL (ref >40)
LDL Cholesterol: 31 mg/dL (ref 0–99)
Total CHOL/HDL Ratio: 3.5 RATIO
Triglycerides: 388 mg/dL — ABNORMAL HIGH (ref <150)
VLDL: 78 mg/dL — ABNORMAL HIGH (ref 0–40)

## 2020-08-06 LAB — BILIRUBIN, DIRECT: Bilirubin, Direct: <0.1 mg/dL (ref 0.0–0.2)

## 2020-08-11 ENCOUNTER — Telehealth: Payer: Self-pay | Admitting: *Deleted

## 2020-08-11 NOTE — Telephone Encounter (Signed)
-----   Message from Arvil Chaco, PA-C sent at 08/08/2020 12:15 PM EST ----- Creatinine bumped from previous labs though within usual baseline window when considering all previous labs.   Bilirubin normal. Liver function normal.   Total cholesterol 152 with LDL 31 and at goal of below 70. Triglycerides elevated but in the setting of non-fasting labs. We will plan to recheck them again later when fasting.   No medication changes.

## 2020-08-11 NOTE — Telephone Encounter (Signed)
Spoke to pt. Notified of results and recc to repeat Triglycerides when fasting. Pt has follow up scheduled 11/14/20 @ 10 AM and agrees to come to that appt fasting to have lab work done in office. Pt has no other questions at this time.

## 2020-09-25 ENCOUNTER — Other Ambulatory Visit: Payer: Self-pay | Admitting: Cardiovascular Disease

## 2020-11-07 ENCOUNTER — Other Ambulatory Visit: Payer: Self-pay

## 2020-11-07 ENCOUNTER — Ambulatory Visit (INDEPENDENT_AMBULATORY_CARE_PROVIDER_SITE_OTHER): Payer: 59

## 2020-11-07 DIAGNOSIS — I5022 Chronic systolic (congestive) heart failure: Secondary | ICD-10-CM | POA: Diagnosis not present

## 2020-11-07 DIAGNOSIS — I428 Other cardiomyopathies: Secondary | ICD-10-CM | POA: Diagnosis not present

## 2020-11-07 LAB — ECHOCARDIOGRAM COMPLETE
AR max vel: 3.18 cm2
AV Area VTI: 3.37 cm2
AV Area mean vel: 3.14 cm2
AV Mean grad: 3 mmHg
AV Peak grad: 5.5 mmHg
Ao pk vel: 1.17 m/s
Calc EF: 39.7 %
S' Lateral: 3.5 cm
Single Plane A2C EF: 40.5 %
Single Plane A4C EF: 38.8 %

## 2020-11-13 NOTE — Progress Notes (Signed)
Office Visit    Patient Name: David Carlson Date of Encounter: 11/14/2020  Primary Care Provider:  [facility] Primary Cardiologist:  Ida Rogue, MD  Chief Complaint    Chief Complaint  Patient presents with  . Other    3 month f/u no complaints today. Meds reviewed verbally with pt.    50 yo male with hx of HFrEF / cardiomyopathy (EF 30 to 35%  50-55%), h/o DVT and bilateral PE, h/o GIB with recommendation for colonoscopy not yet obtained, PVCs, previous palpitations, hypertension, hyperlipidemia, hypertriglyceridemia, obesity, GERD, current alcohol use, current tobacco use (1/2 pack/day) anxiety, and erectile dysfunction, and here for 3 month follow-up.  Past Medical History    Past Medical History:  Diagnosis Date  . Anxiety   . Bilateral pulmonary embolism ([place])    a. 03/2019 CTA Chest: multifocal occlusive segmental to subsegmental embolus present bilaterally, most notably in the bilateral lower lobes.  Concern for right middle lobe and right lower lobe pulmonary infarction-->Xarelto initiated.  . Broken shoulder   . Collar bone fracture    x 2  . Erectile dysfunction   . GERD (gastroesophageal reflux disease)   . GIB (gastrointestinal bleeding)    a. 03/2019 in setting of Xarelto 15mg  BID after PE dx-->GI rec conservative rx given NICM/PE and stable H/H.  Marland Kitchen Gout   . H/O Bell's palsy   . HFrEF (heart failure with reduced ejection fraction) ([place])    a. 03/2019 Echo: EF 20-25%, restrictive filling. Mildly reduced RV fxn. RVSP 42.5 (also acute PE). Mild to mod MR/TR. Mod LAE, Mild RAE.  Marland Kitchen Hyperlipidemia   . Hypertension   . NICM (nonischemic cardiomyopathy) ([place])    a. 03/2019 Echo: EF 20-25%; b. 03/2019 Cath: LM nl, LAD 33m, RI nl, LCX nl, RCA large, 10p.  . Obesity   . Palpitations    a. Previously evaluted when he was incarcerated in [place] - reportedly nl stress test.  . PVC's (premature ventricular contractions)    a. 04/2013 Echo: EF 50-55%,  Gr1 DD, mildly dil LA;  b. 04/2017 48hr holter: freq PVCs (5.4%), rare PACs. Single 13 beat atrial run - 143 bpm.  . Right leg DVT ([place])    a. 03/2019 LE U/S: Chronic R peroneal DVT.   Past Surgical History:  Procedure Laterality Date  . CARDIAC CATHETERIZATION    . CARPAL TUNNEL RELEASE Bilateral   . HERNIA REPAIR  2015  . HIP SURGERY     a. Age 18 - failure of growth plate to close - Pins inserted  . LEFT HEART CATH AND CORONARY ANGIOGRAPHY N/A 03/27/2019   Procedure: LEFT HEART CATH AND CORONARY ANGIOGRAPHY;  Surgeon: Nelva Bush, MD;  Location: [place] [facility];  Service: Cardiovascular;  Laterality: N/A;    Allergies  No Known Allergies  History of Present Illness    David Carlson is a 51 y.o. male with PMH as above.   He has a history of palpitations that dates back to age 63.  He underwent stress testing while incarcerated in [place] in 2008 and this was normal.  He was told that he was having PVCs.  After losing some weight, he had resolutions of palpitations.  In 2018, he noted recurrent palpitations.  Echo showed nl LVSF with diastolic dysfunction.  48-hour Holter monitoring showed frequent PVCs accounting for 5.4% of total beats.  He was treated with beta-blocker therapy at that time with improvement in his symptoms.  Since  that time, he has quit smoking and drinking and his palpitations have remained under control with beta-blocker.    He was seen by [facility] cardiology for follow-up of a 6-week history of dyspnea and left lower extremity swelling on 03/24/2019 and in the setting of being more sedentary. Concern was for DVT / PE, though no palpable cord was felt on exam. While in the clinic, LE [place] was obtained to r/o DVT with chronic clot / DVT on the right lower extremity. Stat CTA of the chest showed PE multifocal occlusive segmental to subsegmental bilateral lower lobe emboli with concern for developing pulmonary infarction. He was sent to the ED and admitted and  placed on anticoagulation. Echo showed EF 20-25% with reduced RVSF, mild to moderate MR/TR. He underwent LHC for reduced LVSF that showed mild nonobstructive dz / NICM and was discharged. Later in July, he returned to the ED with GIB in the setting of Xarelto 15mg  BID. Xarelto was held then resumed at lower maintenance dose 20g daily.  Echo 06/2019 showed EF 30-35%, DD, RVSF moderately reduced.   He was last seen by his primary cardiologist 08/13/2019. 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  He was followed by hematology / oncology and tolerating Xarelto (noted to need at least 6 months OSA). He felt his dry wt was 218lbs. Labs showed low K and he was started on supplementation.  Seen in clinic 07/21/2020 and reported chest pain that occurred 30 to 45 minutes after eating.  Chest pain was both exertional and occurred at rest.  If chest pain occurred with exertion, it usually lasted the majority of the day before self resolving.  This occurred a few times per month.  Chest pain was further described as burning and central chest pain, which he attributed to his reflux.  He reported staying active at his job in maintenance, walking up to 18,000 steps per day.  No chest pain or shortness of breath with the steps.  He had restarted both tobacco and alcohol use.  Tobacco use was estimated at 1/2 pack/day.  He reported 3+ beers on Saturdays.  Potassium was restarted.  He had self discontinued Xarelto, which was relayed to his primary cardiologist, and with recommendation to reach out to GI regarding a colonoscopy.  Repeat echo showed continued low EF at 30 to 35%.  We discussed need for repeat echo with possible EP referral for ICD therapy if continued low EF. He had self discontinued statin as below with restart recommended.  BP130/90 with GDMT escalation.  Low-salt diet and fluid restriction reviewed in detail.    Today, 11/14/2020, he returns to clinic following repeat echo  and denies any cardiac sx.  We discussed his echo in detail, which showed low normal EF 50 to 55%.  We discussed that this pump function was improved from previous studies, though the LV demonstrated regional wall motion abnormalities with select images showing septal wall hypokinesis.  As a result, recommendation was to review his symptoms with a low threshold for repeat ischemic evaluation if any recurrent chest pain or shortness of breath.  Today, he reports that his chest pain is reported above dissipated with the refill of Protonix provided at her last visit.  He denies any recurrent chest pain since that time.  No shortness of breath.  He does not have a regular workout routine but denies any chest pain or shortness of breath with exertion.  He reports that he  remains very active with his job.  He continues to smoke at 1/2 pack/day.  He reports a 12 pack of alcohol on the weekend.  We discussed that his echo also showed rare PVCs with patient report that he does not experience any tachypalpitations.  Caffeine use reported as to cold coffees at [facility] per day.  No presyncope or syncope.  No loss of consciousness.  He has not yet followed up with GI regarding a colonoscopy but intends to do so.  He denies any signs or symptoms of bleeding.  He reports medication compliance with the changes recommended at his previous visit.  He reports other office visits with SBP 120s and DBP 80s.    Home Medications    Current Outpatient Medications on File Prior to Visit  Medication Sig Dispense Refill  . albuterol (VENTOLIN HFA) 108 (90 Base) MCG/ACT inhaler Inhale 2 puffs into the lungs every 6 (six) hours as needed for wheezing or shortness of breath. 1 Inhaler 0  . aspirin EC 81 MG tablet Take 1 tablet (81 mg total) by mouth daily. Swallow whole.    Marland Kitchen atorvastatin (LIPITOR) 20 MG tablet Take 1 tablet (20 mg total) by mouth daily. 30 tablet 6  . carvedilol (COREG) 6.25 MG tablet Take 1 tablet (6.25 mg total) by  mouth 2 (two) times daily with a meal. 180 tablet 3  . colchicine 0.6 MG tablet Take 0.6 mg by mouth daily as needed.     . diclofenac (VOLTAREN) 75 MG EC tablet Take 75 mg by mouth daily as needed.    . fluticasone (FLONASE) 50 MCG/ACT nasal spray Place 2 sprays into both nostrils daily. 16 g 6  . furosemide (LASIX) 40 MG tablet Take 1 tablet by mouth once daily 30 tablet 1  . Multiple Vitamin (MULTIVITAMIN WITH MINERALS) TABS tablet Take 1 tablet by mouth daily.    . pantoprazole (PROTONIX) 40 MG tablet Take 1 tablet (40 mg) by mouth once daily 90 tablet 2  . potassium chloride (KLOR-CON) 10 MEQ tablet Take 1 tablet (10 mEq total) by mouth daily. 30 tablet 6  . sacubitril-valsartan (ENTRESTO) 49-51 MG Take 1 tablet by mouth 2 (two) times daily. 180 tablet 3  . spironolactone (ALDACTONE) 25 MG tablet Take 1 tablet (25 mg total) by mouth daily. 30 tablet 6  . MITIGARE 0.6 MG CAPS Take 1 capsule by mouth daily as needed.     No current facility-administered medications on file prior to visit.    Review of Systems    He reports resolution of previously described chest pain with restart of Protonix.   No signs or symptoms of bleeding.  He has not yet had his colonoscopy.  No shortness of breath or lower extremity edema/asymmetric edema.  No tachypalpitations.  Now PND, orthopnea, abdominal distention, early satiety, or weight gain.  No nausea, emesis, presyncope, or syncope.   All other systems reviewed and are otherwise negative except as noted above.  Physical Exam    VS:  BP 120/82 (BP Location: Left Arm, Patient Position: Sitting, Cuff Size: Normal)   Pulse 99   Ht 5\' 10"  (1.778 m)   Wt 189 lb 2 oz (85.8 kg)   SpO2 98%   BMI 27.14 kg/m  , BMI Body mass index is 27.14 kg/m. GEN: Well nourished, well developed, in no acute distress. HEENT: normal. Neck: Supple, no JVD, carotid bruits, or masses. Cardiac: RRR.  No murmurs, rubs, or gallops. No clubbing, cyanosis.no lower extremity  edema.  Radials/DP/PT 2+ and equal bilaterally.  Respiratory:  Respirations regular and unlabored, clear to auscultation bilaterally. GI: Soft, nontender, nondistended, BS + x 4. MS: no deformity or atrophy. Skin: warm and dry, no rash. Neuro:  Strength and sensation are intact. Psych: Normal affect.  Accessory Clinical Findings    ECG personally reviewed by me today -NSR, 99 bpm, PR interval 166 ms, QRS 74 ms, QTC 477 ms, poor conduction through lead III as seen in prior EKGs- no acute changes.  VITALS Reviewed today   Temp Readings from Last 3 Encounters:  07/18/20 (!) 96.9 F (36.1 C)  01/14/20 (!) 95.7 F (35.4 C)  06/20/19 (!) 95.3 F (35.2 C)   BP Readings from Last 3 Encounters:  11/14/20 120/82  07/21/20 130/90  07/18/20 132/84   Pulse Readings from Last 3 Encounters:  11/14/20 99  07/21/20 (!) 104  07/18/20 94    Wt Readings from Last 3 Encounters:  11/14/20 189 lb 2 oz (85.8 kg)  07/21/20 197 lb (89.4 kg)  07/18/20 198 lb 3.2 oz (89.9 kg)     LABS  reviewed today   Lab Results  Component Value Date   WBC 5.1 07/21/2020   HGB 14.5 07/21/2020   HCT 40.9 07/21/2020   MCV 99 (H) 07/21/2020   PLT 251 07/21/2020   Lab Results  Component Value Date   CREATININE 1.16 08/06/2020   BUN 24 (H) 08/06/2020   NA 140 08/06/2020   K 4.3 08/06/2020   CL 105 08/06/2020   CO2 24 08/06/2020   Lab Results  Component Value Date   ALT 29 08/06/2020   AST 28 08/06/2020   ALKPHOS 52 08/06/2020   BILITOT 0.8 08/06/2020   Lab Results  Component Value Date   CHOL 152 08/06/2020   HDL 43 08/06/2020   LDLCALC 31 08/06/2020   TRIG 388 (H) 08/06/2020   CHOLHDL 3.5 08/06/2020    Lab Results  Component Value Date   HGBA1C 5.5 03/24/2019   Lab Results  Component Value Date   TSH 0.82 02/26/2019     STUDIES/PROCEDURES reviewed today   Echo  11/07/20 1. Left ventricular ejection fraction, by estimation, is 50 to 55%. The  left ventricle has low normal  function. The left ventricle demonstrates  regional wall motion abnormalities (select images with septal wall motion  hypokinesis).  2. Right ventricular systolic function is normal. The right ventricular  size is normal.  3. The mitral valve is normal in structure. No evidence of mitral valve  regurgitation. No evidence of mitral stenosis.  4. Rare PVCs   LHC 03/27/2019 Conclusions: 1. Mild, nonobstructive coronary artery disease. Findings areconsistent with nonischemic cardiomyopathy. 2. Severely elevated left ventricular filling pressure. Recommendations: 1. IV diuresis and optimization of evidence-based heart failure therapy. 2. Start heparin infusion 2 hours after TR band removal; anticipate transitioning to oral anticoagulation for treatment of bilateral pulmonary emboli tomorrow or whenever no further invasive procedures are planned.  Cardiac Monitor, 48h 04/2017  The patient was monitored for 32 hours.  The predominant rhythm was sinus with an average rate of 107 bpm (range 55-159 bpm).  There were rare PACs. A single atrial run lasting 13 beats with a maximum rate of 143 bpm was observed  Frequent PVCs (5.4% PVC burden) were seen, including rare ventricular couplets and triplets.  There were no patient triggered events. Predominantly sinus rhythm with high average heart rate (>100 bpm). Frequent PVCs, rare PACs, and single atrial run were noted.  Assessment &  Plan    NICM, HFrEF with recovery of EF to low nl (EF 30-35%  50-55%) --No chest pain.  No shortness of breath or dyspnea.  Euvolemic and well compensated on exam.  Repeat echo with recovery of EF to low nl 50-55%, though noting RWMA with select images showing septal hypokinesis. Long discussion regarding these findings with low threshold for further ischemic workup if any recurrent or concerning sx as reviewed today. He is asx and denies any sx. EKG today stable without acute ST/T changes / stable. Based on our  discussion, plan for medical management and lifestyle changes, as well as aggressive risk factor modification. Close monitoring of sx. He is aware of the low threshold for repeat ischemic evaluation if any recurrent chest pain or shortness of breath/dyspnea, given echo as above. Discussed we can repeat the echo once he has been able to quit alcohol and tobacco or in 6-12 months. If sx recur before that time, further ischemic workup.  Continue ASA 81 mg daily, spironolactone 25 mg daily, Entresto 49-51, Lasix 40 mg daily with potassium supplementation.   Increase to carvedilol 12.5 mg twice daily, given room in heart rate and BP today and PVCs seen on echo.    Increase to atorvastatin 40 mg daily for aggressive risk factor control / Tg control.   Monitor home BP and weight. Lifestyle changes including diet, increased activity, smoking / alcohol cessation.  Ensure all fluid intake under 2 L daily and salt intake under 2 g daily.  Call the office if any new or worsening symptoms. Reassess at RTC as above, sooner if needed.   H/o Bilateral Pulmonary Embolism, DVT  Previous OAC with Xarelto History of GIB --Denies any signs or symptoms similar to that experienced before his DVT/PE as above.  No longer on anticoagulation, discussed with primary cardiologist after last visit.  ]  Continue ASA 81 mg daily with Protonix.   Follow-up with GI regarding colonoscopy.  Contact the office if any s/sx of bleeding as reviewed today.  HTN, goal BP 130/80 or lower  --BP 120/82, improved from previous.  DBP borderline at 82.  HR 99 bpm.   Increase to carvedilol 12.5 mg twice daily.    Continue current doses of Entresto, spironolactone, Lasix.    Salt under 2 g daily and all fluids under 2 L daily.    Home BP /weight checks recommended.   PVCs/ Palpitations, resolved --No further palpitations reported.   PVCs on echo. EKG today without PVCs or arrhythmia. HR 99bpm with room in BP as above.    Increase  to carvedilol 12.5 mg twice daily.  HLD, LDL goal below 70 Hypertriglyceridemia --Total cholesterol on 07/2020 at 152 with LDL 31.  Tg elevated at 388.    Increase to atorvastatin 40mg  daily to attempt better triglyceride control.  Repeat lipid and liver function in 6 to 8 weeks or at RTC. If triglycerides remain elevated with atorvastatin 40 mg daily, consider atorvastatin 80 mg versus addition of Vascepa at that time.  Current tobacco / alcohol use --Complete cessation advised. ---------------  Medication changes: Increase to carvedilol 12.5 mg daily & atorvastatin 40 mg daily. Labs ordered: None. Studies / Imaging ordered: None.  Future considerations: At RTC, repeat lipid and liver to reassess triglycerides.  Could consider Vascepa. As above, low threshold for repeat ischemic work-up if recurrent chest pain or shortness of breath. Repeat echo 6-65mo. Disposition: RTC 6 months, sooner if recurrent sx.   Arvil Chaco, PA-C 11/14/2020

## 2020-11-14 ENCOUNTER — Ambulatory Visit (INDEPENDENT_AMBULATORY_CARE_PROVIDER_SITE_OTHER): Payer: 59 | Admitting: Physician Assistant

## 2020-11-14 ENCOUNTER — Encounter: Payer: Self-pay | Admitting: Physician Assistant

## 2020-11-14 ENCOUNTER — Other Ambulatory Visit: Payer: Self-pay

## 2020-11-14 VITALS — BP 120/82 | HR 99 | Ht 70.0 in | Wt 189.1 lb

## 2020-11-14 DIAGNOSIS — E781 Pure hyperglyceridemia: Secondary | ICD-10-CM

## 2020-11-14 DIAGNOSIS — I493 Ventricular premature depolarization: Secondary | ICD-10-CM

## 2020-11-14 DIAGNOSIS — E785 Hyperlipidemia, unspecified: Secondary | ICD-10-CM

## 2020-11-14 DIAGNOSIS — Z789 Other specified health status: Secondary | ICD-10-CM

## 2020-11-14 DIAGNOSIS — Z720 Tobacco use: Secondary | ICD-10-CM

## 2020-11-14 DIAGNOSIS — I428 Other cardiomyopathies: Secondary | ICD-10-CM

## 2020-11-14 DIAGNOSIS — Z7289 Other problems related to lifestyle: Secondary | ICD-10-CM

## 2020-11-14 DIAGNOSIS — I5022 Chronic systolic (congestive) heart failure: Secondary | ICD-10-CM

## 2020-11-14 DIAGNOSIS — I10 Essential (primary) hypertension: Secondary | ICD-10-CM

## 2020-11-14 DIAGNOSIS — K219 Gastro-esophageal reflux disease without esophagitis: Secondary | ICD-10-CM

## 2020-11-14 DIAGNOSIS — Z86718 Personal history of other venous thrombosis and embolism: Secondary | ICD-10-CM

## 2020-11-14 DIAGNOSIS — Z8719 Personal history of other diseases of the digestive system: Secondary | ICD-10-CM

## 2020-11-14 MED ORDER — CARVEDILOL 12.5 MG PO TABS: 12.5000 mg | ORAL_TABLET | Freq: Two times a day (BID) | ORAL | 5 refills | Status: DC

## 2020-11-14 MED ORDER — ATORVASTATIN CALCIUM 40 MG PO TABS: 40.0000 mg | ORAL_TABLET | Freq: Every day | ORAL | 5 refills | Status: AC

## 2020-11-14 MED ORDER — ATORVASTATIN CALCIUM 80 MG PO TABS: 80.0000 mg | ORAL_TABLET | Freq: Every day | ORAL | 5 refills | Status: DC

## 2020-11-14 NOTE — Patient Instructions (Addendum)
Medication Instructions:  Your physician has recommended you make the following change in your medication:   INCREASE Atorvastatin to 40mg  DAILY - A new Rx has been sent to your pharmacy  INCREASE Carvedilol to 12.5mg  TWICE daily - You may take TWO tablets of your current dose (6.25mg ) TWICE daily until you get new Rx  *If you need a refill on your cardiac medications before your next appointment, please call your pharmacy*   Lab Work: None ordered   Testing/Procedures: None ordered   Follow-Up: At [facility], you and your health needs are our priority.  As part of our continuing mission to provide you with exceptional heart care, we have created designated Provider Care Teams.  These Care Teams include your primary Cardiologist (physician) and Advanced Practice Providers (APPs -  Physician Assistants and Nurse Practitioners) who all work together to provide you with the care you need, when you need it.  We recommend signing up for the patient portal called "MyChart".  Sign up information is provided on this After Visit Summary.  MyChart is used to connect with patients for Virtual Visits (Telemedicine).  Patients are able to view lab/test results, encounter notes, upcoming appointments, etc.  Non-urgent messages can be sent to your provider as well.   To learn more about what you can do with MyChart, go to [email or web address].    Your next appointment:   4 - 6 month(s)  The format for your next appointment:   In Person  Provider:   You may see Ida Rogue, MD or one of the following Advanced Practice Providers on your designated Care Team:    Murray Hodgkins, NP  Christell Faith, PA-C  Marrianne Mood, PA-C  Cadence [place], [place]  Laurann [place], NP    Other Instructions  If you have any recurrent chest pain please call our office for further work up and we will schedule an appointment sooner.

## 2020-11-18 ENCOUNTER — Encounter: Payer: Managed Care, Other (non HMO) | Admitting: Family Medicine

## 2020-11-25 ENCOUNTER — Other Ambulatory Visit: Payer: Self-pay | Admitting: Cardiovascular Disease

## 2020-12-26 ENCOUNTER — Encounter: Payer: 59 | Admitting: Family Medicine

## 2020-12-29 ENCOUNTER — Ambulatory Visit: Payer: 59 | Admitting: Cardiovascular Disease

## 2021-01-02 ENCOUNTER — Encounter: Payer: 59 | Admitting: Family Medicine

## 2021-01-09 ENCOUNTER — Ambulatory Visit (INDEPENDENT_AMBULATORY_CARE_PROVIDER_SITE_OTHER): Payer: 59 | Admitting: Family Medicine

## 2021-01-09 ENCOUNTER — Other Ambulatory Visit: Payer: Self-pay

## 2021-01-09 ENCOUNTER — Encounter: Payer: Self-pay | Admitting: Family Medicine

## 2021-01-09 VITALS — BP 114/72 | HR 96 | Temp 97.8°F | Resp 18 | Ht 70.0 in | Wt 192.4 lb

## 2021-01-09 DIAGNOSIS — Z0000 Encounter for general adult medical examination without abnormal findings: Secondary | ICD-10-CM

## 2021-01-09 DIAGNOSIS — E559 Vitamin D deficiency, unspecified: Secondary | ICD-10-CM | POA: Diagnosis not present

## 2021-01-09 DIAGNOSIS — Z720 Tobacco use: Secondary | ICD-10-CM

## 2021-01-09 DIAGNOSIS — E782 Mixed hyperlipidemia: Secondary | ICD-10-CM

## 2021-01-09 DIAGNOSIS — K219 Gastro-esophageal reflux disease without esophagitis: Secondary | ICD-10-CM

## 2021-01-09 DIAGNOSIS — M1A9XX0 Chronic gout, unspecified, without tophus (tophi): Secondary | ICD-10-CM

## 2021-01-09 DIAGNOSIS — I5021 Acute systolic (congestive) heart failure: Secondary | ICD-10-CM

## 2021-01-09 DIAGNOSIS — Z6827 Body mass index (BMI) 27.0-27.9, adult: Secondary | ICD-10-CM

## 2021-01-09 DIAGNOSIS — E663 Overweight: Secondary | ICD-10-CM

## 2021-01-09 DIAGNOSIS — R1032 Left lower quadrant pain: Secondary | ICD-10-CM

## 2021-01-09 MED ORDER — FUROSEMIDE 40 MG PO TABS: 40.0000 mg | ORAL_TABLET | Freq: Every day | ORAL | 2 refills | Status: DC

## 2021-01-09 NOTE — Progress Notes (Signed)
4/29/20229:48 AM  David Carlson 11-15-1970, 50 y.o., male [id number]  Chief Complaint  Patient presents with  . Annual Exam    HPI:   Patient is a 50 y.o. male with past medical history significant for HLD, HTN who presents today for physical.  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 COLONOSCOPY (Pts 45-16yrs Insurance coverage will need to be confirmed)  Never done  . COVID-19 Vaccine (1) 01/25/2021 (Originally 02/07/1976)  . INFLUENZA VACCINE  04/13/2021  . TETANUS/TDAP  02/22/2028  . Hepatitis C Screening  Completed  . HIV Screening  Completed  . HPV VACCINES  Aged Out   Patient Care Team: [place] as PCP - General (Family Medicine) Minna Merritts, MD as PCP - Cardiology (Cardiology)   Depression screen [facility] 2/9 01/09/2021 02/26/2019 01/30/2019  Decreased Interest 0 0 0  Down, Depressed, Hopeless 0 0 0  PHQ - 2 Score 0 0 0  Altered sleeping - 0 0  Tired, decreased energy - 0 0  Change in appetite - 0 0  Feeling bad or failure about yourself  - 0 0  Trouble concentrating - 0 0  Moving slowly or fidgety/restless - 0 0  Suicidal thoughts - 0 0  PHQ-9 Score - 0 0  Difficult doing work/chores - Not difficult at all Not difficult at all    Fall Risk  01/09/2021 01/30/2019 09/29/2018 02/21/2018 12/20/2017  Falls in the past year? 0 0 0 No No  Number falls in past yr: 0 0 - -  -  Injury with Fall? 0 0 - - -  Follow up Falls evaluation completed - - - -     No Known Allergies  Prior to Admission medications   Medication Sig Start Date End Date Taking? Authorizing Provider  albuterol (VENTOLIN HFA) 108 (90 Base) MCG/ACT inhaler Inhale 2 puffs into the lungs every 6 (six) hours as needed for wheezing or shortness of breath. 01/30/19   Hubbard Hartshorn, FNP  aspirin EC 81 MG tablet Take 1 tablet (81 mg total) by mouth daily. Swallow whole. 07/21/20   Marrianne Mood D, PA-C  atorvastatin (LIPITOR) 40 MG tablet Take 1 tablet (40 mg total) by mouth daily. 11/14/20 05/13/21  Marrianne Mood D, PA-C  carvedilol (COREG) 12.5 MG tablet Take 1 tablet (12.5 mg total) by mouth 2 (two) times daily with a meal. 11/14/20 05/13/21  Marrianne Mood D, PA-C  colchicine 0.6 MG tablet Take 0.6 mg by mouth daily as needed.     [provider]  diclofenac (VOLTAREN) 75 MG EC tablet Take 75 mg by mouth daily as needed.    [provider]  ENTRESTO 49-51 MG Take 1 tablet by mouth twice daily 11/25/20   Minna Merritts, MD  fluticasone (FLONASE) 50 MCG/ACT nasal spray Place 2 sprays into both nostrils daily. 01/30/19   Hubbard Hartshorn, FNP  furosemide (LASIX) 40 MG tablet Take 1 tablet by mouth once daily 09/25/20   Minna Merritts, MD  MITIGARE 0.6 MG CAPS Take 1 capsule by mouth daily as needed. 10/26/20   [provider]  Multiple Vitamin (MULTIVITAMIN WITH MINERALS) TABS tablet Take 1 tablet by mouth daily.    [provider]  pantoprazole (PROTONIX) 40 MG tablet Take 1 tablet (40 mg) by mouth once daily 07/21/20   Marrianne Mood D, PA-C  potassium chloride (KLOR-CON) 10 MEQ tablet Take 1 tablet (10 mEq total) by mouth daily. 07/21/20 10/19/20  Marrianne Mood D, PA-C  spironolactone (ALDACTONE) 25 MG tablet Take 1 tablet (25 mg total) by mouth daily. 07/21/20 10/19/20  Arvil Chaco, PA-C    Past Medical History:  Diagnosis Date  . Anxiety   .  Bilateral pulmonary embolism ([place])    a. 03/2019 CTA Chest: multifocal occlusive segmental to subsegmental embolus present bilaterally, most notably in the bilateral lower lobes.  Concern for right middle lobe and right lower lobe pulmonary infarction-->Xarelto initiated.  . Broken shoulder   . Collar bone fracture    x 2  . Erectile dysfunction   . GERD (gastroesophageal reflux disease)   . GIB (gastrointestinal bleeding)    a. 03/2019 in setting of Xarelto 15mg  BID after PE dx-->GI rec conservative rx given NICM/PE and stable H/H.  Marland Kitchen Gout   . H/O Bell's palsy   . HFrEF (heart failure with reduced ejection fraction) ([place])    a. 03/2019 Echo: EF 20-25%, restrictive filling. Mildly reduced RV fxn. RVSP 42.5 (also acute PE). Mild to mod MR/TR. Mod LAE, Mild RAE.  Marland Kitchen Hyperlipidemia   . Hypertension   . NICM (nonischemic cardiomyopathy) ([place])    a. 03/2019 Echo: EF 20-25%; b. 03/2019 Cath: LM nl, LAD 59m, RI nl, LCX nl, RCA large, 10p.  . Obesity   . Palpitations    a. Previously evaluted when he was incarcerated in [place] - reportedly nl stress test.  . PVC's (premature ventricular contractions)    a. 04/2013 Echo: EF 50-55%, Gr1 DD, mildly dil LA;  b. 04/2017 48hr holter: freq PVCs (5.4%), rare PACs. Single 13 beat atrial run - 143 bpm.  . Right leg DVT ([place])    a. 03/2019 LE U/S: Chronic R peroneal DVT.    Past Surgical History:  Procedure Laterality Date  . CARDIAC CATHETERIZATION    . CARPAL TUNNEL RELEASE Bilateral   . HERNIA REPAIR  2015  . HIP SURGERY     a. Age 76 - failure of growth plate to close - Pins inserted  . LEFT HEART CATH AND CORONARY ANGIOGRAPHY N/A 03/27/2019   Procedure: LEFT HEART CATH AND CORONARY ANGIOGRAPHY;  Surgeon: Nelva Bush, MD;  Location: [facility];  Service: Cardiovascular;  Laterality: N/A;    Social History   Tobacco Use  . Smoking status: Current Every Day Smoker    Packs/day: 0.50    Years: 20.00    Pack years: 10.00    Types:  Cigarettes    Last attempt to quit: 09/15/2015    Years since quitting: 5.3  . Smokeless tobacco: Never Used  Substance Use Topics  . Alcohol use: Yes    Alcohol/week: 0.0 standard drinks    Comment: 3-6 cans/beer on weeknights, 6-12 beers on weekends.    Family History  Problem Relation Age of Onset  . Diabetes Mother   . Hypertension Father   . Heart disease Maternal Grandmother   . Heart  attack Maternal Grandmother   . Diabetes Maternal Grandfather   . Diabetes Paternal Grandfather     Review of Systems  Respiratory: Negative.   Cardiovascular: Negative.   Gastrointestinal: Positive for abdominal pain. Negative for heartburn.  Genitourinary: Negative.   Musculoskeletal: Negative.   Neurological: Negative.      OBJECTIVE:  Today's Vitals   01/09/21 0923  BP: 114/72  Pulse: 96  Resp: 18  Temp: 97.8 F (36.6 C)  TempSrc: Oral  SpO2: 99%  Weight: 192 lb 6.4 oz (87.3 kg)  Height: 5\' 10"  (1.778 m)   Body mass index is 27.61 kg/m.   Physical Exam Constitutional:      General: He is not in acute distress.    Appearance: Normal appearance. He is normal weight. He is not ill-appearing.  HENT:     Head: Normocephalic.     Right Ear: Tympanic membrane and ear canal normal.     Left Ear: Tympanic membrane and ear canal normal.     Nose: Nose normal.     Mouth/Throat:     Mouth: Mucous membranes are moist.     Dentition: Normal dentition.     Pharynx: Oropharynx is clear. No pharyngeal swelling, oropharyngeal exudate or posterior oropharyngeal erythema.  Eyes:     Extraocular Movements: Extraocular movements intact.     Conjunctiva/sclera: Conjunctivae normal.     Pupils: Pupils are equal, round, and reactive to light.  Neck:     Thyroid: No thyroid mass, thyromegaly or thyroid tenderness.  Cardiovascular:     Rate and Rhythm: Normal rate and regular rhythm.     Pulses: Normal pulses.     Heart sounds: Normal heart sounds. No murmur heard. No friction rub. No  gallop.   Pulmonary:     Effort: Pulmonary effort is normal. No respiratory distress.     Breath sounds: Normal breath sounds.  Abdominal:     General: Bowel sounds are normal.     Palpations: Abdomen is soft.     Tenderness: There is no abdominal tenderness.     Hernia: A hernia is present. Hernia is present in the left inguinal area.  Musculoskeletal:        General: No tenderness, deformity or signs of injury. Normal range of motion.     Cervical back: Normal range of motion.  Lymphadenopathy:     Cervical: No cervical adenopathy.  Skin:    General: Skin is warm and dry.  Neurological:     Mental Status: He is alert and oriented to person, place, and time.  Psychiatric:        Mood and Affect: Mood normal.        Behavior: Behavior normal.     No results found for this or any previous visit (from the past 24 hour(s)).  No results found.   ASSESSMENT and PLAN  Problem List Items Addressed This Visit      Cardiovascular and Mediastinum   Acute systolic heart failure (HCC)   Relevant Medications   furosemide (LASIX) 40 MG tablet     Digestive   GERD (gastroesophageal reflux disease) (Chronic)     Other   Hypercholesterolemia with hypertriglyceridemia - Primary   Relevant Medications   furosemide (LASIX) 40 MG tablet   Other Relevant Orders   Lipid Panel   Left inguinal pain   Relevant Orders   Ambulatory referral to General Surgery   Vitamin D deficiency   Relevant Orders   Vitamin D, 25-hydroxy   Annual physical  exam   Relevant Orders   CBC with Differential   Comprehensive metabolic panel   Hemoglobin A1c   Hepatitis C antibody   TSH   Ambulatory referral to Gastroenterology    Other Visit Diagnoses    Chronic gout involving toe of left foot without tophus, unspecified cause       Relevant Orders   Uric Acid       Plan . Will follow up with lab results . Medication refills sent . Continue to work on smoking cessation, keep up with alcohol  cessation . Referral placed to GI for colonoscopy . Chronic conditions stable on current regimens    Return in about 6 months (around 07/11/2021).    Huston Foley Nyanna Heideman, FNP-BC [facility]

## 2021-01-09 NOTE — Patient Instructions (Signed)

## 2021-01-14 ENCOUNTER — Other Ambulatory Visit: Payer: Self-pay

## 2021-01-14 DIAGNOSIS — D649 Anemia, unspecified: Secondary | ICD-10-CM

## 2021-01-14 NOTE — Progress Notes (Signed)
Labs added.

## 2021-01-15 LAB — COMPREHENSIVE METABOLIC PANEL
AG Ratio: 1.4 (calc) (ref 1.0–2.5)
ALT: 14 U/L (ref 9–46)
AST: 14 U/L (ref 10–40)
Albumin: 4 g/dL (ref 3.6–5.1)
Alkaline phosphatase (APISO): 50 U/L (ref 36–130)
BUN: 24 mg/dL (ref 7–25)
CO2: 26 mmol/L (ref 20–32)
Calcium: 9.2 mg/dL (ref 8.6–10.3)
Chloride: 105 mmol/L (ref 98–110)
Creat: 0.72 mg/dL (ref 0.60–1.35)
Globulin: 2.8 g/dL (calc) (ref 1.9–3.7)
Glucose, Bld: 98 mg/dL (ref 65–99)
Potassium: 4.9 mmol/L (ref 3.5–5.3)
Sodium: 140 mmol/L (ref 135–146)
Total Bilirubin: 0.6 mg/dL (ref 0.2–1.2)
Total Protein: 6.8 g/dL (ref 6.1–8.1)

## 2021-01-15 LAB — CBC WITH DIFFERENTIAL/PLATELET
Absolute Monocytes: 485 cells/uL (ref 200–950)
Basophils Absolute: 29 cells/uL (ref 0–200)
Basophils Relative: 0.5 %
Eosinophils Absolute: 131 cells/uL (ref 15–500)
Eosinophils Relative: 2.3 %
HCT: 36 % — ABNORMAL LOW (ref 38.5–50.0)
Hemoglobin: 12.1 g/dL — ABNORMAL LOW (ref 13.2–17.1)
Lymphs Abs: 1368 cells/uL (ref 850–3900)
MCH: 33.1 pg — ABNORMAL HIGH (ref 27.0–33.0)
MCHC: 33.6 g/dL (ref 32.0–36.0)
MCV: 98.4 fL (ref 80.0–100.0)
MPV: 10.2 fL (ref 7.5–12.5)
Monocytes Relative: 8.5 %
Neutro Abs: 3688 cells/uL (ref 1500–7800)
Neutrophils Relative %: 64.7 %
Platelets: 227 10*3/uL (ref 140–400)
RBC: 3.66 10*6/uL — ABNORMAL LOW (ref 4.20–5.80)
RDW: 12.2 % (ref 11.0–15.0)
Total Lymphocyte: 24 %
WBC: 5.7 10*3/uL (ref 3.8–10.8)

## 2021-01-15 LAB — TEST AUTHORIZATION

## 2021-01-15 LAB — HEPATITIS C ANTIBODY
Hepatitis C Ab: NONREACTIVE
SIGNAL TO CUT-OFF: 0.01 (ref <1.00)

## 2021-01-15 LAB — LIPID PANEL
Cholesterol: 132 mg/dL (ref <200)
HDL: 43 mg/dL (ref >=40)
LDL Cholesterol (Calc): 57 mg/dL (calc)
Non-HDL Cholesterol (Calc): 89 mg/dL (calc) (ref <130)
Total CHOL/HDL Ratio: 3.1 (calc) (ref <5.0)
Triglycerides: 276 mg/dL — ABNORMAL HIGH (ref <150)

## 2021-01-15 LAB — HEMOGLOBIN A1C
Hgb A1c MFr Bld: 4.8 % of total Hgb (ref <5.7)
Mean Plasma Glucose: 91 mg/dL
eAG (mmol/L): 5 mmol/L

## 2021-01-15 LAB — TSH: TSH: 0.81 mIU/L (ref 0.40–4.50)

## 2021-01-15 LAB — IRON,TIBC AND FERRITIN PANEL
%SAT: 24 % (calc) (ref 20–48)
Ferritin: 354 ng/mL (ref 38–380)
Iron: 73 ug/dL (ref 50–180)
TIBC: 310 mcg/dL (calc) (ref 250–425)

## 2021-01-15 LAB — VITAMIN D 25 HYDROXY (VIT D DEFICIENCY, FRACTURES): Vit D, 25-Hydroxy: 18 ng/mL — ABNORMAL LOW (ref 30–100)

## 2021-01-15 LAB — URIC ACID: Uric Acid, Serum: 5.2 mg/dL (ref 4.0–8.0)

## 2021-01-20 ENCOUNTER — Ambulatory Visit: Payer: Self-pay | Admitting: Surgery

## 2021-02-05 ENCOUNTER — Ambulatory Visit: Payer: 59 | Admitting: Surgery

## 2021-02-05 ENCOUNTER — Other Ambulatory Visit: Payer: Self-pay

## 2021-02-05 ENCOUNTER — Encounter: Payer: Self-pay | Admitting: Surgery

## 2021-02-05 VITALS — BP 129/88 | HR 116 | Temp 98.4°F | Ht 70.0 in | Wt 195.8 lb

## 2021-02-05 DIAGNOSIS — K4090 Unilateral inguinal hernia, without obstruction or gangrene, not specified as recurrent: Secondary | ICD-10-CM | POA: Diagnosis not present

## 2021-02-05 DIAGNOSIS — R1032 Left lower quadrant pain: Secondary | ICD-10-CM | POA: Diagnosis not present

## 2021-02-05 NOTE — Patient Instructions (Addendum)
Your CT is scheduled for 02/18/2021 @ 11:30 (arrive by 11:15 am) at the [facility] on [facility]. Nothing to eat or drink 4 hours prior to scan and pick up oral contrast between now and the day before the CT at any [facility] Radiology Dept.   If you have any concerns or questions, please feel free to call our office. See follow up appointment below.    Inguinal Hernia, Adult An inguinal hernia is when fat or your intestines push through a weak spot in a muscle where your leg meets your lower belly (groin). This causes a bulge. This kind of hernia could also be:  In your scrotum, if you are male.  In folds of skin around your vagina, if you are male. There are three types of inguinal hernias:  Hernias that can be pushed back into the belly (are reducible). This type rarely causes pain.  Hernias that cannot be pushed back into the belly (are incarcerated).  Hernias that cannot be pushed back into the belly and lose their blood supply (are strangulated). This type needs emergency surgery. What are the causes? This condition is caused by having a weak spot in the muscles or tissues in your groin. This develops over time. The hernia may poke through the weak spot when you strain your lower belly muscles all of a sudden, such as when you:  Lift a heavy object.  Strain to poop (have a bowel movement). Trouble pooping (constipation) can lead to straining.  Cough. What increases the risk? This condition is more likely to develop in:  Males.  Pregnant females.  People who: ? Are overweight. ? Work in jobs that require long periods of standing or heavy lifting. ? Have had an inguinal hernia before. ? Smoke or have lung disease. These factors can lead to long-term (chronic) coughing. What are the signs or symptoms? Symptoms may depend on the size of the hernia. Often, a small hernia has no symptoms. Symptoms of a larger hernia may include:  A bulge in the groin area. This is  easier to see when standing. You might not be able to see it when you are lying down.  Pain or burning in the groin. This may get worse when you lift, strain, or cough.  A dull ache or a feeling of pressure in the groin.  An abnormal bulge in the scrotum, in males. Symptoms of a strangulated inguinal hernia may include:  A bulge in your groin that is very painful and tender to the touch.  A bulge that turns red or purple.  Fever, feeling like you may vomit (nausea), and vomiting.  Not being able to poop or to pass gas. How is this treated? Treatment depends on the size of your hernia and whether you have symptoms. If you do not have symptoms, your doctor may have you watch your hernia carefully and have you come in for follow-up visits. If your hernia is large or if you have symptoms, you may need surgery to repair the hernia. Follow these instructions at home: Lifestyle  Avoid lifting heavy objects.  Avoid standing for long amounts of time.  Do not smoke or use any products that contain nicotine or tobacco. If you need help quitting, ask your doctor.  Stay at a healthy weight. Prevent trouble pooping You may need to take these actions to prevent or treat trouble pooping:  Drink enough fluid to keep your pee (urine) pale yellow.  Take over-the-counter or prescription medicines.  Eat  foods that are high in fiber. These include beans, whole grains, and fresh fruits and vegetables.  Limit foods that are high in fat and sugar. These include fried or sweet foods. General instructions  You may try to push your hernia back in place by very gently pressing on it when you are lying down. Do not try to push the bulge back in if it will not go in easily.  Watch your hernia for any changes in shape, size, or color. Tell your doctor if you see any changes.  Take over-the-counter and prescription medicines only as told by your doctor.  Keep all follow-up visits. Contact a doctor  if:  You have a fever or chills.  You have new symptoms.  Your symptoms get worse. Get help right away if:  You have pain in your groin that gets worse all of a sudden.  You have a bulge in your groin that: ? Gets bigger all of a sudden, and it does not get smaller after that. ? Turns red or purple. ? Is painful when you touch it.  You are a male, and you have: ? Sudden pain in your scrotum. ? A sudden change in the size of your scrotum.  You cannot push the hernia back in place by very gently pressing on it when you are lying down.  You feel like you may vomit, and that feeling does not go away.  You keep vomiting.  You have a fast heartbeat.  You cannot poop or pass gas. These symptoms may be an emergency. Get help right away. Call your local emergency services (911 in the [place]  Do not wait to see if the symptoms will go away.  Do not drive yourself to the hospital. Summary  An inguinal hernia is when fat or your intestines push through a weak spot in a muscle where your leg meets your lower belly (groin). This causes a bulge.  If you do not have symptoms, you may not need treatment. If you have symptoms or a large hernia, you may need surgery.  Avoid lifting heavy objects. Also, avoid standing for long amounts of time.  Do not try to push the bulge back in if it will not go in easily. This information is not intended to replace advice given to you by your health care provider. Make sure you discuss any questions you have with your health care provider. Document Revised: 04/29/2020 Document Reviewed: 04/29/2020 Elsevier Patient Education  2021 [employer].

## 2021-02-05 NOTE — Progress Notes (Signed)
Patient ID: David Carlson, male   DOB: [birth year], 50 y.o.   MRN: [id number]  Chief Complaint: Left groin pain  History of Present Illness David Carlson is a 50 y.o. male with history of a transabdominal left inguinal hernia repair for an intrascrotal/incarcerated hernia in 2015.  Over the last 3 months he has had progressive discomfort in that left groin region with pain as high as a 5 out of 10.  In attempting to determine what exacerbates the pain he says it can happen when he standing, can happen when he sitting however he never notices any particular bulge when he is coughing or sneezing.  Denies nausea, vomiting.  Reports normal bowel movements. He reports his ejection fraction has returned to 50+ percent on his last echo.  For his pulmonary embolism was on Xarelto, which was cut in half dosing when he sustained some GI bleeding.  He has been discontinued from any anticoagulation and is now taking aspirin. He reports he is quite active accumulating 16,000 steps in a day of work.  Denies any shortness of breath with activity.  Past Medical History Past Medical History:  Diagnosis Date  . Anxiety   . Bilateral pulmonary embolism ([place])    a. 03/2019 CTA Chest: multifocal occlusive segmental to subsegmental embolus present bilaterally, most notably in the bilateral lower lobes.  Concern for right middle lobe and right lower lobe pulmonary infarction-->Xarelto initiated.  . Broken shoulder   . Collar bone fracture    x 2  . Erectile dysfunction   . GERD (gastroesophageal reflux disease)   . GIB (gastrointestinal bleeding)    a. 03/2019 in setting of Xarelto 15mg  BID after PE dx-->GI rec conservative rx given NICM/PE and stable H/H.  Marland Kitchen Gout   . H/O Bell's palsy   . HFrEF (heart failure with reduced ejection fraction) ([place])    a. 03/2019 Echo: EF 20-25%, restrictive filling. Mildly reduced RV fxn. RVSP 42.5 (also acute PE). Mild to mod MR/TR. Mod LAE, Mild RAE.  Marland Kitchen Hyperlipidemia    . Hypertension   . NICM (nonischemic cardiomyopathy) ([place])    a. 03/2019 Echo: EF 20-25%; b. 03/2019 Cath: LM nl, LAD 84m, RI nl, LCX nl, RCA large, 10p.  . Obesity   . Palpitations    a. Previously evaluted when he was incarcerated in [place] - reportedly nl stress test.  . PVC's (premature ventricular contractions)    a. 04/2013 Echo: EF 50-55%, Gr1 DD, mildly dil LA;  b. 04/2017 48hr holter: freq PVCs (5.4%), rare PACs. Single 13 beat atrial run - 143 bpm.  . Right leg DVT ([place])    a. 03/2019 LE U/S: Chronic R peroneal DVT.      Past Surgical History:  Procedure Laterality Date  . CARDIAC CATHETERIZATION    . CARPAL TUNNEL RELEASE Bilateral   . HERNIA REPAIR  2015  . HIP SURGERY     a. Age 23 - failure of growth plate to close - Pins inserted  . LEFT HEART CATH AND CORONARY ANGIOGRAPHY N/A 03/27/2019   Procedure: LEFT HEART CATH AND CORONARY ANGIOGRAPHY;  Surgeon: Nelva Bush, MD;  Location: [facility];  Service: Cardiovascular;  Laterality: N/A;    No Known Allergies  Current Outpatient Medications  Medication Sig Dispense Refill  . albuterol (VENTOLIN HFA) 108 (90 Base) MCG/ACT inhaler Inhale 2 puffs into the lungs every 6 (six) hours as needed for wheezing or shortness of breath. 1 Inhaler 0  . aspirin EC  81 MG tablet Take 1 tablet (81 mg total) by mouth daily. Swallow whole.    Marland Kitchen atorvastatin (LIPITOR) 40 MG tablet Take 1 tablet (40 mg total) by mouth daily. 30 tablet 5  . carvedilol (COREG) 12.5 MG tablet Take 1 tablet (12.5 mg total) by mouth 2 (two) times daily with a meal. 60 tablet 5  . colchicine 0.6 MG tablet Take 0.6 mg by mouth daily as needed.     . diclofenac (VOLTAREN) 75 MG EC tablet Take 75 mg by mouth daily as needed.    Marland Kitchen ENTRESTO 49-51 MG Take 1 tablet by mouth twice daily 60 tablet 5  . fluticasone (FLONASE) 50 MCG/ACT nasal spray Place 2 sprays into both nostrils daily. 16 g 6  . furosemide (LASIX) 40 MG tablet Take 1 tablet (40 mg total) by mouth  daily. 30 tablet 2  . MITIGARE 0.6 MG CAPS Take 1 capsule by mouth daily as needed.    . Multiple Vitamin (MULTIVITAMIN WITH MINERALS) TABS tablet Take 1 tablet by mouth daily.    . pantoprazole (PROTONIX) 40 MG tablet Take 1 tablet (40 mg) by mouth once daily 90 tablet 2  . potassium chloride (KLOR-CON) 10 MEQ tablet Take 1 tablet (10 mEq total) by mouth daily. 30 tablet 6  . spironolactone (ALDACTONE) 25 MG tablet Take 1 tablet (25 mg total) by mouth daily. 30 tablet 6   No current facility-administered medications for this visit.    Family History Family History  Problem Relation Age of Onset  . Diabetes Mother   . Hypertension Father   . Heart disease Maternal Grandmother   . Heart attack Maternal Grandmother   . Diabetes Maternal Grandfather   . Diabetes Paternal Grandfather       Social History Social History   Tobacco Use  . Smoking status: Current Every Day Smoker    Packs/day: 0.50    Years: 20.00    Pack years: 10.00    Types: Cigarettes    Last attempt to quit: 09/15/2015    Years since quitting: 5.3  . Smokeless tobacco: Never Used  Vaping Use  . Vaping Use: Former  Substance Use Topics  . Alcohol use: Yes    Alcohol/week: 0.0 standard drinks    Comment: 3-6 cans/beer on weeknights, 6-12 beers on weekends.  . Drug use: No        Review of Systems  Constitutional: Negative.   HENT: Negative.   Eyes: Negative.   Respiratory: Negative.   Cardiovascular: Negative.   Gastrointestinal: Positive for abdominal pain.  Genitourinary: Negative.   Skin: Negative.   Neurological: Negative.   Psychiatric/Behavioral: Negative.       Physical Exam There were no vitals taken for this visit.   CONSTITUTIONAL: Well developed, and nourished, appropriately responsive and aware without distress.   EYES: Sclera non-icteric.   EARS, NOSE, MOUTH AND THROAT: Mask worn.   Hearing is intact to voice.  NECK: Trachea is midline, and there is no jugular venous distension.   LYMPH NODES:  Lymph nodes in the neck are not enlarged. RESPIRATORY:  Lungs are clear, and breath sounds are equal bilaterally. Normal respiratory effort without pathologic use of accessory muscles. CARDIOVASCULAR: Heart is regular in rate and rhythm. GI: The abdomen is soft, nontender, and nondistended. There were no palpable masses. I did not appreciate hepatosplenomegaly. There were normal bowel sounds. GU: Non-circ phallus, desc testes.  No appreciable LIH, some movement appreciated in inguinal floor w/o sac appreciable.  MUSCULOSKELETAL:  Symmetrical  muscle tone appreciated in all four extremities.    SKIN: Skin turgor is normal. No pathologic skin lesions appreciated.  NEUROLOGIC:  Motor and sensation appear grossly normal.  Cranial nerves are grossly without defect. PSYCH:  Alert and oriented to person, place and time. Affect is appropriate for situation.  Data Reviewed I have personally reviewed what is currently available of the patient's imaging, recent labs and medical records.   Labs:  CBC Latest Ref Rng & Units 01/09/2021 07/21/2020 07/18/2020  WBC 3.8 - 10.8 Thousand/uL 5.7 5.1 6.6  Hemoglobin 13.2 - 17.1 g/dL 12.1(L) 14.5 13.5  Hematocrit 38.5 - 50.0 % 36.0(L) 40.9 37.8(L)  Platelets 140 - 400 Thousand/uL 227 251 150   CMP Latest Ref Rng & Units 01/09/2021 08/06/2020 07/21/2020  Glucose 65 - 99 mg/dL 98 105(H) 107(H)  BUN 7 - 25 mg/dL 24 24(H) 12  Creatinine 0.60 - 1.35 mg/dL 0.72 1.16 0.77  Sodium 135 - 146 mmol/L 140 140 146(H)  Potassium 3.5 - 5.3 mmol/L 4.9 4.3 3.9  Chloride 98 - 110 mmol/L 105 105 104  CO2 20 - 32 mmol/L 26 24 24   Calcium 8.6 - 10.3 mg/dL 9.2 9.3 8.9  Total Protein 6.1 - 8.1 g/dL 6.8 7.6 7.2  Total Bilirubin 0.2 - 1.2 mg/dL 0.6 0.8 0.3  Alkaline Phos 38 - 126 U/L - 52 56  AST 10 - 40 U/L 14 28 27   ALT 9 - 46 U/L 14 29 26       Imaging:  Within last 24 hrs: No results found.  Assessment    Left groin pain, presumed left inguinal hernia  recurrence, though not appreciated on exam. Patient Active Problem List   Diagnosis Date Noted  . GI bleed 03/30/2019  . Cardiomyopathy ([place])   . Elevated troponin   . Acute systolic heart failure ([place])   . Pulmonary embolism ([place]) 03/23/2019  . Medication monitoring encounter 12/20/2017  . Annual physical exam 10/15/2016  . Vitamin D deficiency 01/13/2016  . Erectile dysfunction 01/13/2016  . Encounter for annual health examination 10/01/2015  . Fatigue 10/01/2015  . Left inguinal pain 07/10/2015  . GERD (gastroesophageal reflux disease) 03/14/2015  . Anxiety disorder 03/14/2015  . Hypercholesterolemia with hypertriglyceridemia 03/14/2015  . BP (high blood pressure) 03/14/2015  . Dermatitis, eczematoid 03/14/2015  . Headache, tension-type 03/14/2015  . Cervical pain 03/14/2015    Plan    Will obtain abdominal pelvic CT scan to help identify the left lower quadrant pain left inguinal pain.  We will follow-up after imaging.  Face-to-face time spent with the patient and accompanying care providers(if present) was 30 minutes, with more than 50% of the time spent counseling, educating, and coordinating care of the patient.    These notes generated with voice recognition software. I apologize for typographical errors.  Ronny Bacon M.D., FACS 02/05/2021, 1:56 PM

## 2021-02-18 ENCOUNTER — Other Ambulatory Visit: Payer: Self-pay

## 2021-02-18 ENCOUNTER — Ambulatory Visit
Admission: RE | Admit: 2021-02-18 | Discharge: 2021-02-18 | Disposition: A | Discharge status: Home / Self Care | Payer: 59 | Source: Ambulatory Visit | Attending: Surgery | Admitting: Surgery

## 2021-02-18 DIAGNOSIS — K4090 Unilateral inguinal hernia, without obstruction or gangrene, not specified as recurrent: Secondary | ICD-10-CM | POA: Diagnosis not present

## 2021-03-05 ENCOUNTER — Other Ambulatory Visit: Payer: Self-pay

## 2021-03-05 ENCOUNTER — Ambulatory Visit: Payer: 59 | Admitting: Surgery

## 2021-03-05 ENCOUNTER — Encounter: Payer: Self-pay | Admitting: Surgery

## 2021-03-05 VITALS — BP 122/84 | HR 103 | Temp 99.1°F | Ht 70.0 in | Wt 190.2 lb

## 2021-03-05 DIAGNOSIS — R1032 Left lower quadrant pain: Secondary | ICD-10-CM

## 2021-03-05 NOTE — Progress Notes (Signed)
Here to review CT findings.  He is well versed and read the radiologist report.  I have reviewed the images as well as read the report, I do not see any significant inguinal hernia defect.  I do see evidence of the tacking and the mesh.  But I do not see any recurrent hernia or new hernia on the opposite side.  I believe he will continue to be careful on the job avoid stressing these areas.  And as of present I do not see any role of intervention.  The patient is in agreement and will follow up with [place] as needed.

## 2021-04-05 NOTE — Progress Notes (Signed)
Date:  04/06/2021   ID:  David Carlson, DOB 1971-01-27, MRN [id number]  Patient Location:  [place] [address]   Provider location:   [facility], [place] office  PCP:  [place]  Cardiologist:  [facility]   Chief Complaint  Patient presents with   OTher    4-6 month follow up. Patient c.o dizziness within the past week when bending over -- He works outside and thinks it is due to the heat. Meds reviewed verbally with patient.      History of Present Illness:    David Carlson is a 50 y.o. male  past medical history of palpitations,  hypertension,  Hyperlipidemia, obesity,  GERD,  anxiety,  PVCs,  Admitted to the hospital July 2020 for  HFrEF / cardiomyopathy with EF 25%  Cath 03/2019: nonobstructive CAD Echo 10/2020: EF 50 to 55% Chronic DVT, pulmonary embolism  03/2019 Who presents for follow-up in the clinic for follow-up of his nonischemic cardiomyopathy and pulmonary embolism, post discharge  Prior cardiac catheterization July 2020 nonobstructive coronary disease, nonischemic cardiomyopathy  Echo 10/2020  1. Left ventricular ejection fraction, by estimation, is 50 to 55%. The  left ventricle has low normal function. The left ventricle demonstrates  regional wall motion abnormalities (select images with septal wall motion  hypokinesis).   2. Right ventricular systolic function is normal. The right ventricular  size is normal.   3. The mitral valve is normal in structure. No evidence of mitral valve  regurgitation. No evidence of mitral stenosis.   4. 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 He was then sent for STAT CTA of the chest to r/o PE. This showed multifocal occlusive segmental to subsegmental bilateral lower lobe emboli with concern for developing pulmonary infarction.  Referred to the emergency room  Ejection fraction 20 to 25%  Cardiac catheterization  with nonobstructive disease  Started on Coreg 6.25 twice daily, Lasix 40 daily,  losartan 25 daily  Shortness of breath, some chest discomfort on inspiration on the right side radiating through to his back felt secondary to pulmonary embolism Started on Xarelto 15 twice daily   While in the hospital Ultrasound performed recently showing gallstones, no significant abdominal pain   He is completed MRCP with and without contrast, results pending   Past Medical History:  Diagnosis Date   Anxiety    Bilateral pulmonary embolism ([place])    a. 03/2019 CTA Chest: multifocal occlusive segmental to subsegmental embolus present bilaterally, most notably in the bilateral lower lobes.  Concern for right middle lobe and right lower lobe pulmonary infarction-->Xarelto initiated.   Broken shoulder    Collar bone fracture    x 2   Erectile dysfunction    GERD (gastroesophageal reflux disease)    GIB (gastrointestinal bleeding)    a. 03/2019 in setting of Xarelto '15mg'$  BID after PE dx-->GI rec conservative rx given NICM/PE and  stable H/H.   Gout    H/O Bell's palsy    HFrEF (heart failure with reduced ejection fraction) ([place])    a. 03/2019 Echo: EF 20-25%, restrictive filling. Mildly reduced RV fxn. RVSP 42.5 (also acute PE). Mild to mod MR/TR. Mod LAE, Mild RAE.   Hyperlipidemia    Hypertension    NICM (nonischemic cardiomyopathy) ([place])    a. 03/2019 Echo: EF 20-25%; b. 03/2019 Cath: LM nl, LAD 11m RI nl, LCX nl,  RCA large, 10p.   Obesity    Palpitations    a. [place].   PVC's (premature ventricular contractions)    a. 04/2013 Echo: EF 50-55%, Gr1 DD, mildly dil LA;  b. 04/2017 48hr holter: freq PVCs (5.4%), rare PACs. Single 13 beat atrial run - 143 bpm.   [place]    a. 03/2019 LE U/S: Chronic R peroneal DVT.   Past Surgical History:  Procedure Laterality Date   CARDIAC CATHETERIZATION     CARPAL TUNNEL RELEASE Bilateral    HERNIA REPAIR  2015   HIP SURGERY     a. Age 89- failure of growth plate to close - Pins inserted   LEFT HEART CATH AND CORONARY ANGIOGRAPHY N/A 03/27/2019   Procedure: LEFT HEART CATH AND CORONARY ANGIOGRAPHY;  Surgeon: ENelva Bush MD;  [facility];  Service: Cardiovascular;  Laterality: N/A;     Current Meds  Medication Sig   albuterol (VENTOLIN HFA) 108 (90 Base) MCG/ACT inhaler Inhale 2 puffs into the lungs every 6 (six) hours as needed for wheezing or shortness of breath.   aspirin EC 81 MG tablet Take 1 tablet (81 mg total) by mouth daily. Swallow whole.   atorvastatin (LIPITOR) 40 MG tablet Take 1 tablet (40 mg total) by mouth daily.   carvedilol (COREG) 12.5 MG tablet Take 1 tablet (12.5 mg total) by mouth 2 (two) times daily with a meal.   diclofenac (VOLTAREN) 75 MG EC tablet Take 75 mg by mouth daily as needed.   fluticasone (FLONASE) 50 MCG/ACT nasal spray Place 2 sprays into both nostrils daily.   furosemide (LASIX) 40 MG tablet Take 1 tablet (40 mg total) by mouth daily.   MITIGARE 0.6 MG CAPS Take 1 capsule by mouth daily as needed.   Multiple Vitamin (MULTIVITAMIN WITH MINERALS) TABS tablet Take 1 tablet by mouth daily.   pantoprazole (PROTONIX) 40 MG tablet Take 1 tablet (40 mg) by mouth once daily     Allergies:   Patient has no known allergies.   Social History   Tobacco Use   Smoking status: Every Day    Packs/day: 0.50    Years: 20.00     Pack years: 10.00    Types: Cigarettes    Last attempt to quit: 09/15/2015    Years since quitting: 5.5   Smokeless tobacco: Never  Vaping Use   Vaping Use: Former  Substance Use Topics   Alcohol use: Yes    Alcohol/week: 0.0 standard drinks    Comment: 3-6 cans/beer on weeknights, 6-12 beers on weekends.   Drug use: No     Family Hx: The patient's family history includes Diabetes in his maternal grandfather, mother, and paternal grandfather; Heart attack in his maternal grandmother; Heart disease in his maternal grandmother; Hypertension in his father.  ROS:   Please see the history of present illness.    Review of Systems  Constitutional: Negative.  HENT: Negative.    Respiratory: Negative.    Cardiovascular: Negative.   Gastrointestinal: Negative.   Musculoskeletal: Negative.   Neurological: Negative.   Psychiatric/Behavioral: Negative.    All other systems reviewed and are negative.   Labs/Other Tests and Data Reviewed:    Recent Labs: 01/09/2021: ALT 14; BUN 24; Creat 0.72; Hemoglobin 12.1; Platelets 227; Potassium 4.9; Sodium 140; TSH 0.81   Recent Lipid Panel Lab Results  Component Value Date/Time   CHOL 132 01/09/2021 09:57 AM   CHOL 205 (H) 07/21/2020 11:09 AM   TRIG 276 (H) 01/09/2021 09:57 AM   HDL 43 01/09/2021 09:57 AM   HDL 37 (L) 07/21/2020 11:09 AM   CHOLHDL 3.1 01/09/2021 09:57 AM   LDLCALC 57 01/09/2021 09:57 AM    Wt Readings from Last 3 Encounters:  04/06/21 184 lb (83.5 kg)  03/05/21 190 lb 3.2 oz (86.3 kg)  02/05/21 195 lb 12.8 oz (88.8 kg)     Exam:    Vital Signs:  BP 110/80 (BP Location: Left Arm, Patient Position: Sitting, Cuff Size: Normal)   Pulse (!) 126   Ht '5\' 10"'$  (1.778 m)   Wt 184 lb (83.5 kg)   SpO2 96%   BMI 26.40 kg/m  Constitutional:  oriented to person, place, and time. No distress.  HENT:  Head: Grossly normal Eyes:  no discharge. No scleral icterus.  Neck: No JVD, no carotid bruits  Cardiovascular: Regular rate  and rhythm, no murmurs appreciated Pulmonary/Chest: Clear to auscultation bilaterally, no wheezes or rails Abdominal: Soft.  no distension.  no tenderness.  Musculoskeletal: Normal range of motion Neurological:  normal muscle tone. Coordination normal. No atrophy Skin: Skin warm and dry Psychiatric: normal affect, pleasant  ASSESSMENT & PLAN:    Problem List Items Addressed This Visit       Cardiology Problems   Pulmonary embolism ([place])   Hypercholesterolemia with hypertriglyceridemia   Other Visit Diagnoses     NICM (nonischemic cardiomyopathy) ([place])    -  Primary   Chronic systolic heart failure ([facility])       History of DVT (deep vein thrombosis)       Essential hypertension          bilateral PE predominantly in the lower lung fields Completed Xarelto Visit followed by hematology oncology  Nonischemic cardiomyopathy Blood pressure running low will make Lasix as needed Having orthostasis, tachycardic on today's visit, recent 35 pound weight loss Continue carvedilol 12.5 twice daily Only takes spironolactone when he takes Lasix  Weight loss Working hard at work, change his diet, weight down 35 pounds Discussed weight goals Weight loss likely contributing to lower blood pressure  Smoker We have encouraged him to continue to work on weaning his cigarettes and smoking cessation. He will continue to work on this and does not want any assistance with chantix.     Total encounter time more than 25 minutes  Greater than 50% was spent in counseling and coordination of care with the patient   Signed, Ida Rogue, [place] Office [address], [place],  [address]

## 2021-04-06 ENCOUNTER — Encounter: Payer: Self-pay | Admitting: Cardiovascular Disease

## 2021-04-06 ENCOUNTER — Ambulatory Visit (INDEPENDENT_AMBULATORY_CARE_PROVIDER_SITE_OTHER): Payer: 59 | Admitting: Cardiovascular Disease

## 2021-04-06 ENCOUNTER — Other Ambulatory Visit: Payer: Self-pay

## 2021-04-06 VITALS — BP 110/80 | HR 126 | Ht 70.0 in | Wt 184.0 lb

## 2021-04-06 DIAGNOSIS — I5022 Chronic systolic (congestive) heart failure: Secondary | ICD-10-CM

## 2021-04-06 DIAGNOSIS — I2782 Chronic pulmonary embolism: Secondary | ICD-10-CM | POA: Diagnosis not present

## 2021-04-06 DIAGNOSIS — I10 Essential (primary) hypertension: Secondary | ICD-10-CM

## 2021-04-06 DIAGNOSIS — I5021 Acute systolic (congestive) heart failure: Secondary | ICD-10-CM

## 2021-04-06 DIAGNOSIS — Z86718 Personal history of other venous thrombosis and embolism: Secondary | ICD-10-CM

## 2021-04-06 DIAGNOSIS — E782 Mixed hyperlipidemia: Secondary | ICD-10-CM

## 2021-04-06 DIAGNOSIS — I428 Other cardiomyopathies: Secondary | ICD-10-CM

## 2021-04-06 MED ORDER — FUROSEMIDE 40 MG PO TABS: ORAL_TABLET | ORAL | 2 refills | Status: AC

## 2021-04-06 MED ORDER — SPIRONOLACTONE 25 MG PO TABS: ORAL_TABLET | ORAL | 6 refills | Status: AC

## 2021-04-06 NOTE — Patient Instructions (Addendum)
Medication Instructions:  Your physician has recommended you make the following change in your medication:   1) CHANGE lasix (furosemide) 40 mg- take 0.5 tablet- 1 tablet (20 mg- 40 mg) once daily as needed for increased swelling/ shortness of breath  2) CHANGE spironolactone 25 mg- take 1 tablet (25 mg) daily as needed, when taking lasix (furosemide), for increased swelling/ shortness of breath  3) STOP potassium   4) On hot work days try to skip taking the lasix (furosemide) & spironolactone altogether    If you need a refill on your cardiac medications before your next appointment, please call your pharmacy.    Lab work: No new labs needed   If you have labs (blood work) drawn today and your tests are completely normal, you will receive your results only by: [place] (if you have MyChart) OR A paper copy in the mail If you have any lab test that is abnormal or we need to change your treatment, we will call you to review the results.   Testing/Procedures: No new testing needed   Follow-Up: At [facility], you and your health needs are our priority.  As part of our continuing mission to provide you with exceptional heart care, we have created designated Provider Care Teams.  These Care Teams include your primary Cardiologist (physician) and Advanced Practice Providers (APPs -  Physician Assistants and Nurse Practitioners) who all work together to provide you with the care you need, when you need it.  You will need a follow up appointment in 12 months  Providers on your designated Care Team:   Murray Hodgkins, NP Christell Faith, PA-C Marrianne Mood, PA-C Cadence Kathlen Mody, [place]  Any Other Special Instructions Will Be Listed Below (If Applicable).  COVID-19 Vaccine Information can be found at: [email or web address].uk For questions related to vaccine distribution or appointments, please email [email or web address] or  call [phone].

## 2021-06-08 ENCOUNTER — Other Ambulatory Visit: Payer: Self-pay

## 2021-06-08 MED ORDER — PANTOPRAZOLE SODIUM 40 MG PO TBEC: DELAYED_RELEASE_TABLET | ORAL | 0 refills | Status: DC

## 2021-06-08 MED ORDER — CARVEDILOL 12.5 MG PO TABS: 12.5000 mg | ORAL_TABLET | Freq: Two times a day (BID) | ORAL | 9 refills | Status: AC

## 2021-07-10 ENCOUNTER — Ambulatory Visit: Payer: BC Managed Care – PPO | Admitting: Nurse Practitioner

## 2021-07-17 ENCOUNTER — Encounter: Payer: Self-pay | Admitting: Nurse Practitioner

## 2021-07-17 ENCOUNTER — Other Ambulatory Visit: Payer: Self-pay

## 2021-07-17 ENCOUNTER — Ambulatory Visit: Payer: BC Managed Care – PPO | Admitting: Nurse Practitioner

## 2021-07-17 VITALS — BP 124/86 | HR 98 | Temp 98.2°F | Resp 18 | Ht 70.0 in | Wt 202.1 lb

## 2021-07-17 DIAGNOSIS — I5020 Unspecified systolic (congestive) heart failure: Secondary | ICD-10-CM | POA: Diagnosis not present

## 2021-07-17 DIAGNOSIS — Z1211 Encounter for screening for malignant neoplasm of colon: Secondary | ICD-10-CM

## 2021-07-17 DIAGNOSIS — E559 Vitamin D deficiency, unspecified: Secondary | ICD-10-CM

## 2021-07-17 DIAGNOSIS — Z23 Encounter for immunization: Secondary | ICD-10-CM

## 2021-07-17 DIAGNOSIS — M545 Low back pain, unspecified: Secondary | ICD-10-CM

## 2021-07-17 DIAGNOSIS — E782 Mixed hyperlipidemia: Secondary | ICD-10-CM | POA: Diagnosis not present

## 2021-07-17 DIAGNOSIS — K219 Gastro-esophageal reflux disease without esophagitis: Secondary | ICD-10-CM

## 2021-07-17 DIAGNOSIS — F411 Generalized anxiety disorder: Secondary | ICD-10-CM

## 2021-07-17 DIAGNOSIS — I10 Essential (primary) hypertension: Secondary | ICD-10-CM | POA: Diagnosis not present

## 2021-07-17 MED ORDER — METHOCARBAMOL 500 MG PO TABS: 500.0000 mg | ORAL_TABLET | Freq: Three times a day (TID) | ORAL | 0 refills | Status: AC | PRN

## 2021-07-17 MED ORDER — PANTOPRAZOLE SODIUM 40 MG PO TBEC: DELAYED_RELEASE_TABLET | ORAL | 3 refills | Status: AC

## 2021-07-17 NOTE — Progress Notes (Signed)
BP 124/86   Pulse 98   Temp 98.2 F (36.8 C) (Oral)   Resp 18   Ht 5\' 10"  (1.778 m)   Wt 202 lb 1.6 oz (91.7 kg)   SpO2 98%   BMI 29.00 kg/m    Subjective:    Patient ID: David Carlson, male    DOB: [birth year], 50 y.o.   MRN: [id number]  HPI: David Carlson is a 50 y.o. male, here alone  Chief Complaint  Patient presents with   Hyperlipidemia   Gastroesophageal Reflux    6 month follow up   Hyperlipidemia:  He is currently on atorvastatin 40 mg daily.  He says he has been out for about three weeks. He is going to pickup his prescription today. His last LDL was 57.  Triglycerides were elevated at 276.  Will recheck labs today.  Systolic heart failure: He saw his cardiologist Dr. Rockey Situ on 04/06/21.  He stopped taking his lasix due to the heat of the summer, he says was getting dizzy and Dr. Rockey Situ, recommended stopping the lasix temporarily, thinking it was due to dehydration between the head and the lasix.  Patient reports that the dizziness did get better when he stopped the lasix.  He said he will be restarting it soon since it is getting cooler.  Echo 10/2020  1. Left ventricular ejection fraction, by estimation, is 50 to 55%. The  left ventricle has low normal function. The left ventricle demonstrates  regional wall motion abnormalities (select images with septal wall motion  hypokinesis).   2. Right ventricular systolic function is normal. The right ventricular  size is normal.   3. The mitral valve is normal in structure. No evidence of mitral valve  regurgitation. No evidence of mitral stenosis.   4. Rare PVCs    Echocardiogram July 2020 ejection fraction 20 to 25%  GERD: He says his acid reflux is controlled as long as he takes his medication.  He is currently on pantoprazole 40 mg daily.   Anxiety: He says his anxiety has been fine.  He is not currently on any medication.  GAD score 0, PHQ2 score 0.  HTN:  Blood pressure is 124/86, he is currently  taking carvedilol 12.5 mg BID, spironolactone 25 mg daily.  He does not check his blood pressure regularly.  He denies any chest pain or shortness of breath.   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  Discussed if he was taking his vitamin d supplements and he said no he was not.  Encouraged patient to take them.   Low back pain: He says his left lower back has been hurting for about two weeks.  He has been taking ibuprofen for pain relief but he says it is not really helping  He says he is not sure what he did but he is a physical hard worker.  He denies any incontinence, urinary complaints, or numbness or tingling in extremities.  Pain does not radiate it just aches and pulls.  Will try muscle relaxer and heat therapy.  Patient agreeable to plan.   Depression screen [facility] 2/9 07/17/2021 01/09/2021 02/26/2019 01/30/2019 09/29/2018  Decreased Interest 0 0 0 0 0  Down, Depressed, Hopeless 0 0 0 0 0  PHQ - 2 Score 0 0 0 0 0  Altered sleeping - - 0 0 0  Tired, decreased energy - - 0 0 0  Change in appetite - - 0 0 0  Feeling bad or failure about yourself  - - 0 0 0  Trouble concentrating - - 0 0 0  Moving slowly or fidgety/restless - - 0 0 0  Suicidal thoughts - - 0 0 0  PHQ-9 Score - - 0 0 0  Difficult doing work/chores - - Not difficult at all Not difficult at all -      GAD 7 : Generalized Anxiety Score 07/17/2021  Nervous, Anxious, on Edge 0  Control/stop worrying 0  Worry too much - different things 0  Trouble relaxing 0  Restless 0  Easily annoyed or irritable 0  Afraid - awful might happen 0  Total GAD 7 Score 0  Anxiety Difficulty Not difficult at all     Relevant past medical, surgical, family and social history reviewed and updated as indicated. Interim medical history since our last visit reviewed. Allergies and medications reviewed and updated.  Review of Systems  Constitutional: Negative for fever or weight  change.  Respiratory: Negative for cough and shortness of breath.   Cardiovascular: Negative for chest pain or palpitations.  Gastrointestinal: Negative for abdominal pain, no bowel changes.  Musculoskeletal: Negative for gait problem or joint swelling. Left side low back pain Skin: Negative for rash.  Neurological:Positive for dizziness, negative for headache.  No other specific complaints in a complete review of systems (except as listed in HPI above).      Objective:    BP 124/86   Pulse 98   Temp 98.2 F (36.8 C) (Oral)   Resp 18   Ht 5\' 10"  (1.778 m)   Wt 202 lb 1.6 oz (91.7 kg)   SpO2 98%   BMI 29.00 kg/m   Wt Readings from Last 3 Encounters:  07/17/21 202 lb 1.6 oz (91.7 kg)  04/06/21 184 lb (83.5 kg)  03/05/21 190 lb 3.2 oz (86.3 kg)    Physical Exam  Constitutional: Patient appears well-developed and well-nourished. No distress.  HEENT: head atraumatic, normocephalic, pupils equal and reactive to light, neck supple Cardiovascular: Normal rate, regular rhythm and normal heart sounds.  No murmur heard. No BLE edema. Pulmonary/Chest: Effort normal and breath sounds normal. No respiratory distress. Abdominal: Soft.  There is no tenderness. Musculoskeletal:  Left lower back  tenderness, slight decrease in ROM Psychiatric: Patient has a normal mood and affect. behavior is normal. Judgment and thought content normal.   Results for orders placed or performed in visit on 01/09/21  CBC with Differential  Result Value Ref Range   WBC 5.7 3.8 - 10.8 Thousand/uL   RBC 3.66 (L) 4.20 - 5.80 Million/uL   Hemoglobin 12.1 (L) 13.2 - 17.1 g/dL   HCT 36.0 (L) 38.5 - 50.0 %   MCV 98.4 80.0 - 100.0 fL   MCH 33.1 (H) 27.0 - 33.0 pg   MCHC 33.6 32.0 - 36.0 g/dL   RDW 12.2 11.0 - 15.0 %   Platelets 227 140 - 400 Thousand/uL   MPV 10.2 7.5 - 12.5 fL   Neutro Abs 3,688 1,500 - 7,800 cells/uL   Lymphs Abs 1,368 850 - 3,900 cells/uL   Absolute Monocytes 485 200 - 950 cells/uL    Eosinophils Absolute 131 15 - 500 cells/uL   Basophils Absolute 29 0 - 200 cells/uL   Neutrophils Relative % 64.7 %   Total Lymphocyte 24.0 %   Monocytes Relative 8.5 %   Eosinophils Relative 2.3 %   Basophils Relative 0.5 %  Comprehensive metabolic  panel  Result Value Ref Range   Glucose, Bld 98 65 - 99 mg/dL   BUN 24 7 - 25 mg/dL   Creat 0.72 0.60 - 1.35 mg/dL   BUN/Creatinine Ratio NOT APPLICABLE 6 - 22 (calc)   Sodium 140 135 - 146 mmol/L   Potassium 4.9 3.5 - 5.3 mmol/L   Chloride 105 98 - 110 mmol/L   CO2 26 20 - 32 mmol/L   Calcium 9.2 8.6 - 10.3 mg/dL   Total Protein 6.8 6.1 - 8.1 g/dL   Albumin 4.0 3.6 - 5.1 g/dL   Globulin 2.8 1.9 - 3.7 g/dL (calc)   AG Ratio 1.4 1.0 - 2.5 (calc)   Total Bilirubin 0.6 0.2 - 1.2 mg/dL   Alkaline phosphatase (APISO) 50 36 - 130 U/L   AST 14 10 - 40 U/L   ALT 14 9 - 46 U/L  Lipid Panel  Result Value Ref Range   Cholesterol 132 <200 mg/dL   HDL 43 > OR = 40 mg/dL   Triglycerides 276 (H) <150 mg/dL   LDL Cholesterol (Calc) 57 mg/dL (calc)   Total CHOL/HDL Ratio 3.1 <5.0 (calc)   Non-HDL Cholesterol (Calc) 89 <130 mg/dL (calc)  Hemoglobin A1c  Result Value Ref Range   Hgb A1c MFr Bld 4.8 <5.7 % of total Hgb   Mean Plasma Glucose 91 mg/dL   eAG (mmol/L) 5.0 mmol/L  Hepatitis C antibody  Result Value Ref Range   Hepatitis C Ab NON-REACTIVE NON-REACTIVE   SIGNAL TO CUT-OFF 0.01 <1.00  TSH  Result Value Ref Range   TSH 0.81 0.40 - 4.50 mIU/L  Vitamin D, 25-hydroxy  Result Value Ref Range   Vit D, 25-Hydroxy 18 (L) 30 - 100 ng/mL  Uric acid  Result Value Ref Range   Uric Acid, Serum 5.2 4.0 - 8.0 mg/dL  Iron, TIBC and Ferritin Panel  Result Value Ref Range   Iron 73 50 - 180 mcg/dL   TIBC 310 250 - 425 mcg/dL (calc)   %SAT 24 20 - 48 % (calc)   Ferritin 354 38 - 380 ng/mL  TEST AUTHORIZATION  Result Value Ref Range   TEST NAME: IRON, TIBC AND FERRITIN PANEL    TEST CODE: [id number]    CLIENT CONTACT: C SOWLES    REPORT  ALWAYS MESSAGE SIGNATURE        Assessment & Plan:   1. Hypercholesterolemia with hypertriglyceridemia  - Lipid panel  2. Systolic heart failure, unspecified HF chronicity ([place])   3. Gastroesophageal reflux disease, unspecified whether esophagitis present  - pantoprazole (PROTONIX) 40 MG tablet; Take 1 tablet (40 mg) by mouth once daily  Dispense: 90 tablet; Refill: 3  4. Generalized anxiety disorder   5. Primary hypertension  - CBC with Differential/Platelet - COMPLETE METABOLIC PANEL WITH GFR  6. Vitamin D deficiency  - VITAMIN D 25 Hydroxy (Vit-D Deficiency, Fractures)  7. Acute left-sided low back pain without sciatica  - methocarbamol (ROBAXIN) 500 MG tablet; Take 1 tablet (500 mg total) by mouth every 8 (eight) hours as needed for up to 7 days for muscle spasms.  Dispense: 20 tablet; Refill: 0  8. Need for influenza vaccination  - Flu Vaccine QUAD 6+ mos PF IM (Fluarix Quad PF)  9. Colon cancer screening  - Ambulatory referral to Gastroenterology   Follow up plan:  Follow-up in 6 months

## 2021-07-18 LAB — COMPLETE METABOLIC PANEL WITH GFR
AG Ratio: 1.6 (calc) (ref 1.0–2.5)
ALT: 26 U/L (ref 9–46)
AST: 27 U/L (ref 10–40)
Albumin: 4.1 g/dL (ref 3.6–5.1)
Alkaline phosphatase (APISO): 45 U/L (ref 36–130)
BUN: 11 mg/dL (ref 7–25)
CO2: 28 mmol/L (ref 20–32)
Calcium: 8.7 mg/dL (ref 8.6–10.3)
Chloride: 106 mmol/L (ref 98–110)
Creat: 0.91 mg/dL (ref 0.60–1.29)
Globulin: 2.6 g/dL (calc) (ref 1.9–3.7)
Glucose, Bld: 77 mg/dL (ref 65–99)
Potassium: 3.3 mmol/L — ABNORMAL LOW (ref 3.5–5.3)
Sodium: 143 mmol/L (ref 135–146)
Total Bilirubin: 0.5 mg/dL (ref 0.2–1.2)
Total Protein: 6.7 g/dL (ref 6.1–8.1)
eGFR: 103 mL/min/{1.73_m2} (ref >=60)

## 2021-07-18 LAB — VITAMIN D 25 HYDROXY (VIT D DEFICIENCY, FRACTURES): Vit D, 25-Hydroxy: 20 ng/mL — ABNORMAL LOW (ref 30–100)

## 2021-07-18 LAB — CBC WITH DIFFERENTIAL/PLATELET
Absolute Monocytes: 537 cells/uL (ref 200–950)
Basophils Absolute: 43 cells/uL (ref 0–200)
Basophils Relative: 0.7 %
Eosinophils Absolute: 79 cells/uL (ref 15–500)
Eosinophils Relative: 1.3 %
HCT: 36.4 % — ABNORMAL LOW (ref 38.5–50.0)
Hemoglobin: 12.8 g/dL — ABNORMAL LOW (ref 13.2–17.1)
Lymphs Abs: 1678 cells/uL (ref 850–3900)
MCH: 35.2 pg — ABNORMAL HIGH (ref 27.0–33.0)
MCHC: 35.2 g/dL (ref 32.0–36.0)
MCV: 100 fL (ref 80.0–100.0)
MPV: 10.5 fL (ref 7.5–12.5)
Monocytes Relative: 8.8 %
Neutro Abs: 3764 cells/uL (ref 1500–7800)
Neutrophils Relative %: 61.7 %
Platelets: 182 10*3/uL (ref 140–400)
RBC: 3.64 10*6/uL — ABNORMAL LOW (ref 4.20–5.80)
RDW: 12.2 % (ref 11.0–15.0)
Total Lymphocyte: 27.5 %
WBC: 6.1 10*3/uL (ref 3.8–10.8)

## 2021-07-18 LAB — LIPID PANEL
Cholesterol: 213 mg/dL — ABNORMAL HIGH (ref <200)
HDL: 48 mg/dL (ref >=40)
Non-HDL Cholesterol (Calc): 165 mg/dL (calc) — ABNORMAL HIGH (ref <130)
Total CHOL/HDL Ratio: 4.4 (calc) (ref <5.0)
Triglycerides: 413 mg/dL — ABNORMAL HIGH (ref <150)

## 2021-07-21 ENCOUNTER — Other Ambulatory Visit: Payer: Self-pay | Admitting: Cardiovascular Disease

## 2021-07-21 NOTE — Telephone Encounter (Signed)
*  STAT* If patient is at the pharmacy, call can be transferred to refill team.   1. Which medications need to be refilled? (please list name of each medication and dose if known) entresto 49-51 mg po BID  2. Which pharmacy/location (including street and city if local pharmacy) is medication to be sent to? [facility] [place]   3. Do they need a 30 day or 90 day supply? [place]

## 2021-07-21 NOTE — Telephone Encounter (Signed)
Please advise if OK to refill. Not on 04/06/21 AVS or on med list however spoke with patient who states he is still taking this medication. Looks like it may have been removed from patient's med list on 02-05-21 but not mentioned at least office visit. Thank you!

## 2021-08-12 DIAGNOSIS — M79671 Pain in right foot: Secondary | ICD-10-CM | POA: Diagnosis not present

## 2021-08-12 DIAGNOSIS — S93602A Unspecified sprain of left foot, initial encounter: Secondary | ICD-10-CM | POA: Diagnosis not present

## 2021-08-12 DIAGNOSIS — T148XXA Other injury of unspecified body region, initial encounter: Secondary | ICD-10-CM | POA: Diagnosis not present

## 2021-08-12 DIAGNOSIS — M79672 Pain in left foot: Secondary | ICD-10-CM | POA: Diagnosis not present

## 2021-08-13 ENCOUNTER — Other Ambulatory Visit: Payer: Self-pay | Admitting: Podiatry

## 2021-08-13 DIAGNOSIS — M79672 Pain in left foot: Secondary | ICD-10-CM

## 2021-08-18 ENCOUNTER — Other Ambulatory Visit: Payer: Self-pay

## 2021-08-18 ENCOUNTER — Ambulatory Visit
Admission: RE | Admit: 2021-08-18 | Discharge: 2021-08-18 | Disposition: A | Discharge status: Home / Self Care | Payer: BC Managed Care – PPO | Source: Ambulatory Visit | Attending: Podiatry | Admitting: Podiatry

## 2021-08-18 DIAGNOSIS — M79672 Pain in left foot: Secondary | ICD-10-CM | POA: Diagnosis not present

## 2021-08-18 DIAGNOSIS — S99922A Unspecified injury of left foot, initial encounter: Secondary | ICD-10-CM | POA: Diagnosis not present

## 2021-08-18 DIAGNOSIS — M2012 Hallux valgus (acquired), left foot: Secondary | ICD-10-CM | POA: Diagnosis not present

## 2021-08-18 DIAGNOSIS — M19072 Primary osteoarthritis, left ankle and foot: Secondary | ICD-10-CM | POA: Diagnosis not present

## 2021-08-26 DIAGNOSIS — S93602A Unspecified sprain of left foot, initial encounter: Secondary | ICD-10-CM | POA: Diagnosis not present

## 2021-08-26 DIAGNOSIS — T148XXA Other injury of unspecified body region, initial encounter: Secondary | ICD-10-CM | POA: Diagnosis not present

## 2021-10-01 DIAGNOSIS — G5792 Unspecified mononeuropathy of left lower limb: Secondary | ICD-10-CM | POA: Diagnosis not present

## 2021-10-01 DIAGNOSIS — M65872 Other synovitis and tenosynovitis, left ankle and foot: Secondary | ICD-10-CM | POA: Diagnosis not present

## 2021-10-01 DIAGNOSIS — T148XXA Other injury of unspecified body region, initial encounter: Secondary | ICD-10-CM | POA: Diagnosis not present

## 2021-10-20 ENCOUNTER — Telehealth: Payer: Self-pay | Admitting: Cardiovascular Disease

## 2021-10-21 NOTE — Telephone Encounter (Signed)
David Henle, RN administrative Coordinator:   Saw your telephone note. [name] was pronounced dead at 68. Death not under investigation as suspicious, per medical examiner Arnoldo Morale RN, [place] [facility] number if needed [phone])[id number] for death certificate will be generated by the funeral home. No funeral home selected at this time. Thank you

## 2021-11-11 NOTE — Telephone Encounter (Signed)
Per Police department patient found around 330 today cold to touch and not responsive.    SO started CPR and called emergency response. Patient pronounced per office at 3:38 pm.     Officer lyons calling to discuss with provider re death certificate.  Officer states he is having to call [place] .   Aware Dr. Rockey Situ will be notified.

## 2021-11-11 DEATH — deceased

## 2022-01-15 ENCOUNTER — Ambulatory Visit: Payer: BC Managed Care – PPO | Admitting: Nurse Practitioner
